# Patient Record
Sex: Male | Born: 1950
Health system: Southern US, Community
[De-identification: ages and names within clinical notes are randomized; demographics above are authoritative.]

## PROBLEM LIST (undated history)

## (undated) DIAGNOSIS — I77810 Thoracic aortic ectasia: Secondary | ICD-10-CM

## (undated) DIAGNOSIS — I219 Acute myocardial infarction, unspecified: Secondary | ICD-10-CM

## (undated) DIAGNOSIS — I10 Essential (primary) hypertension: Secondary | ICD-10-CM

## (undated) DIAGNOSIS — E119 Type 2 diabetes mellitus without complications: Secondary | ICD-10-CM

## (undated) DIAGNOSIS — E7800 Pure hypercholesterolemia, unspecified: Secondary | ICD-10-CM

## (undated) DIAGNOSIS — R251 Tremor, unspecified: Secondary | ICD-10-CM

## (undated) DIAGNOSIS — Z87442 Personal history of urinary calculi: Secondary | ICD-10-CM

## (undated) DIAGNOSIS — I350 Nonrheumatic aortic (valve) stenosis: Secondary | ICD-10-CM

## (undated) DIAGNOSIS — Z9289 Personal history of other medical treatment: Secondary | ICD-10-CM

## (undated) DIAGNOSIS — I2510 Atherosclerotic heart disease of native coronary artery without angina pectoris: Secondary | ICD-10-CM

## (undated) HISTORY — PX: OTHER SURGICAL HISTORY: SHX169

## (undated) HISTORY — DX: Personal history of urinary calculi: Z87.442

## (undated) HISTORY — DX: Thoracic aortic ectasia: I77.810

## (undated) HISTORY — DX: Pure hypercholesterolemia, unspecified: E78.00

## (undated) HISTORY — DX: Nonrheumatic aortic (valve) stenosis: I35.0

## (undated) HISTORY — DX: Tremor, unspecified: R25.1

## (undated) HISTORY — DX: Personal history of other medical treatment: Z92.89

## (undated) HISTORY — DX: Atherosclerotic heart disease of native coronary artery without angina pectoris: I25.10

## (undated) HISTORY — DX: Essential (primary) hypertension: I10

## (undated) HISTORY — DX: Type 2 diabetes mellitus without complications: E11.9

## (undated) HISTORY — DX: Acute myocardial infarction, unspecified: I21.9

---

## 1998-07-13 ENCOUNTER — Emergency Department (HOSPITAL_COMMUNITY): Admission: EM | Admit: 1998-07-13 | Discharge: 1998-07-13 | Payer: Self-pay | Admitting: Emergency Medicine

## 1998-07-13 ENCOUNTER — Encounter: Payer: Self-pay | Admitting: Emergency Medicine

## 2000-11-16 ENCOUNTER — Ambulatory Visit (HOSPITAL_COMMUNITY): Admission: RE | Admit: 2000-11-16 | Discharge: 2000-11-16 | Payer: Self-pay | Admitting: Gastroenterology

## 2000-11-16 ENCOUNTER — Encounter (INDEPENDENT_AMBULATORY_CARE_PROVIDER_SITE_OTHER): Payer: Self-pay | Admitting: Specialist

## 2006-08-21 ENCOUNTER — Ambulatory Visit (HOSPITAL_BASED_OUTPATIENT_CLINIC_OR_DEPARTMENT_OTHER): Admission: RE | Admit: 2006-08-21 | Discharge: 2006-08-21 | Payer: Self-pay | Admitting: Urology

## 2006-08-27 ENCOUNTER — Ambulatory Visit (HOSPITAL_COMMUNITY): Admission: RE | Admit: 2006-08-27 | Discharge: 2006-08-27 | Payer: Self-pay | Admitting: Urology

## 2010-03-23 ENCOUNTER — Emergency Department (HOSPITAL_COMMUNITY): Admission: EM | Admit: 2010-03-23 | Discharge: 2010-03-23 | Payer: Self-pay | Admitting: Emergency Medicine

## 2010-06-10 ENCOUNTER — Observation Stay (HOSPITAL_COMMUNITY): Admission: RE | Admit: 2010-06-10 | Discharge: 2010-06-11 | Payer: Self-pay | Admitting: Cardiology

## 2011-02-04 LAB — CBC
HCT: 46.3 % (ref 39.0–52.0)
Hemoglobin: 16.2 g/dL (ref 13.0–17.0)
MCH: 32.7 pg (ref 26.0–34.0)
MCHC: 34.9 g/dL (ref 30.0–36.0)
MCV: 93.8 fL (ref 78.0–100.0)
Platelets: 146 10*3/uL — ABNORMAL LOW (ref 150–400)
RBC: 4.94 MIL/uL (ref 4.22–5.81)
RDW: 13.7 % (ref 11.5–15.5)
WBC: 9.7 10*3/uL (ref 4.0–10.5)

## 2011-02-04 LAB — BASIC METABOLIC PANEL
BUN: 13 mg/dL (ref 6–23)
CO2: 26 mEq/L (ref 19–32)
Calcium: 9.5 mg/dL (ref 8.4–10.5)
Chloride: 104 mEq/L (ref 96–112)
Creatinine, Ser: 1.09 mg/dL (ref 0.4–1.5)
GFR calc Af Amer: >60 mL/min (ref >60)
GFR calc non Af Amer: >60 mL/min (ref >60)
Glucose, Bld: 103 mg/dL — ABNORMAL HIGH (ref 70–99)
Potassium: 4 mEq/L (ref 3.5–5.1)
Sodium: 141 mEq/L (ref 135–145)

## 2011-02-07 LAB — COMPREHENSIVE METABOLIC PANEL
ALT: 35 U/L (ref 0–53)
Albumin: 4.3 g/dL (ref 3.5–5.2)
Calcium: 9.6 mg/dL (ref 8.4–10.5)
GFR calc Af Amer: >60 mL/min (ref >60)
Glucose, Bld: 115 mg/dL — ABNORMAL HIGH (ref 70–99)
Sodium: 139 mEq/L (ref 135–145)
Total Protein: 7.3 g/dL (ref 6.0–8.3)

## 2011-02-07 LAB — URINE CULTURE: Colony Count: 30000

## 2011-02-07 LAB — CBC
Hemoglobin: 17 g/dL (ref 13.0–17.0)
MCHC: 35.4 g/dL (ref 30.0–36.0)
Platelets: 158 10*3/uL (ref 150–400)
RDW: 13.5 % (ref 11.5–15.5)

## 2011-02-07 LAB — POCT CARDIAC MARKERS
CKMB, poc: 2.2 ng/mL (ref 1.0–8.0)
Myoglobin, poc: 58.1 ng/mL (ref 12–200)
Myoglobin, poc: 69.6 ng/mL (ref 12–200)

## 2011-02-07 LAB — URINALYSIS, ROUTINE W REFLEX MICROSCOPIC
Glucose, UA: NEGATIVE mg/dL
Specific Gravity, Urine: 1.02 (ref 1.005–1.030)
pH: 7 (ref 5.0–8.0)

## 2011-02-07 LAB — DIFFERENTIAL
Eosinophils Absolute: 0.2 10*3/uL (ref 0.0–0.7)
Lymphs Abs: 1.5 10*3/uL (ref 0.7–4.0)
Monocytes Relative: 11 % (ref 3–12)
Neutrophils Relative %: 63 % (ref 43–77)

## 2011-04-07 NOTE — Op Note (Signed)
NAMEKONNER, SAIZ                 ACCOUNT NO.:  [id number]   MEDICAL RECORD NO.:  [id number]          PATIENT TYPE:  AMB   LOCATION:  [facility]                         FACILITY:  [facility]   PHYSICIAN:  Boston Service, M.D.DATE OF BIRTH:  February 26, 1951   DATE OF PROCEDURE:  08/21/2006  DATE OF DISCHARGE:                                 OPERATIVE REPORT   PREOPERATIVE DIAGNOSIS:  Nausea, vomiting, intractable pain, 7 mm right UPJ  stone.   POSTOPERATIVE DIAGNOSIS:  Nausea, vomiting, intractable pain, 7 mm right UPJ  stone.   PROCEDURES:  Cystoscopy, retrograde, attempted ureteroscopy and right double-  J stent placement.   SURGEON:  Boston Service, M.D.   ASSISTANT:  None.   ANESTHESIA:  General.   SPECIMENS:  None.   DRAIN:  A 6-French 26 cm double-J stent.   COMPLICATIONS:  None obvious.   DESCRIPTION OF PROCEDURE:  The patient was prepped and draped in the dorsal  lithotomy position after institution of an adequate level of general  anesthesia.  CT scan, August 20, 2006, 7 mm stone proximal right ureter.  A 21-French panendoscope was gently inserted at the urethral meatus, normal  urethra and sphincter, nonobstructive prostate.  Clear efflux left orifice,  minimal efflux right orifice.   Retrograde catheter was selected, positioned at the left ureteral orifice.  No evidence of filling defect or obstruction with gentle injection of 3-6 mL  of contrast.  There was prompt drainage at 3-5 minutes.  Similar technique  was used on the right side with gentle injection of contrast, 7 mm filling  defect was outlined immediately below the UPJ.  With gentle injection of  contrast, dilated pelvis and calyces were demonstrated.  Stone began to  migrate proximally almost immediately.  No additional contrast was injected.  Guidewire was inserted through the end-hole catheter, negotiated in the  upper pole calyces.  Cystoscope was removed and replaced with the  ureteroscope.   Ureteroscope was then passed without resistance through the  distal two-thirds of the ureter with continued irrigation flow through the  ureteroscope, however, stone migrated back into what appeared to be the  lower pole calyces.  Decision made not to pursue the stone into the renal  pelvis.  Ureteroscope was then withdrawn.  Indwelling guidewire was used for  positioning of double-J stent.  With removal of the guidewire, there was  excellent pigtail formation on either end of the double-J stent was prompt  reflux of pink tinged urine through the fenestrations of the double-J  bladder was  drained.  Cystoscope was removed.  The patient was given B&O suppository and  returned to recovery in satisfactory condition.   PLAN:  Follow-up ESWL probably later this week.           ______________________________  Boston Service, M.D.     RH/MEDQ  D:  08/21/2006  T:  08/22/2006  Job:  [id number]   cc:   C. Duane Lope, M.D.  Fax: [phone]

## 2011-04-07 NOTE — Procedures (Signed)
[facility]  Patient:    [name] [name], [name] [name]                        MRN: [id number] Proc. Date: 11/16/00 Adm. Date:  [id number] Attending:  Louie Bun CC:         Jamesetta Geralds, M.D.   Procedure Report  PROCEDURE:  Colonoscopy with polypectomy.  ENDOSCOPIST:  Everardo All. Madilyn Fireman.  INDICATION FOR PROCEDURE:  Recently discovered colon polyps on a sigmoidoscopy.  DESCRIPTION OF PROCEDURE:  The patient was placed in the left lateral decubitus position and placed on the pulse monitor with continuous low-flow oxygen delivered by nasal cannula.  He was sedated with Demerol 70 mg IV and Versed 6.5 mg IV.  The Olympus video colonoscope was inserted into the rectum and advanced to the cecum, confirmed by transillumination of McBurneys point and visualization of the ileocecal valve and appendiceal orifice.  The prep was good.  The cecum, ascending, transverse, descending and proximal sigmoid colon appeared normal, with no masses, polyps, diverticula or other mucosal abnormalities.  Within the distal sigmoid and the rectosigmoid, respectively, were seen two round polyps approximately 1 cm each, which were both removed by snare.  The remainder of the rectum appeared normal.  The colonoscope was then withdrawn and the patient returned to the recovery room in stable condition. He tolerated the procedure well and there were no immediate complications.  IMPRESSION:  Two rectosigmoid colon polyps.  PLAN:  Await histology and are to determine interval and method for future colon screening. DD:  11/16/00 TD:  11/16/00 Job: [id number]

## 2011-06-21 DIAGNOSIS — Z9289 Personal history of other medical treatment: Secondary | ICD-10-CM

## 2011-06-21 HISTORY — DX: Personal history of other medical treatment: Z92.89

## 2013-11-03 ENCOUNTER — Telehealth: Payer: Self-pay | Admitting: Cardiology

## 2013-11-03 DIAGNOSIS — E785 Hyperlipidemia, unspecified: Secondary | ICD-10-CM

## 2013-11-03 NOTE — Telephone Encounter (Signed)
New message    Is pt due for blood work?----pt has not had crestor or zetia for over a month because they cannot afford it.  Do we have any presciption vouchers?

## 2013-11-03 NOTE — Telephone Encounter (Signed)
To [name] [name] to advise. Pt was due to lab work last may 2014. He has not had either his Crestor or Zetia for the past month due to not being able to afford. Suggestions for pt? Also should we wait for blood work since he has been off the medications so long?

## 2013-11-04 NOTE — Telephone Encounter (Signed)
Don't get blood work now as not on medications.  Will have him change Crestor to generic lipitor 80 mg once daily for cost reasons.  Also okay to give Zetia voucher for patient for reduced cost.  I have vouchers for patient if you need one.  Recheck lipid panel and hepatic panel 8 weeks later.   Plan: 1.  D/C Crestor. 2.  Start atoravastatin 80 mg qd. 3.  Continue Zetia 10 mg qd.  Give voucher for free month, and one for reduced cost - see me if you can't find one. 4.  Recheck lipid panel and hepatic panel 8 weeks later. Please notify patient, update meds, and set up labs. Thanks.

## 2013-11-05 MED ORDER — EZETIMIBE 10 MG PO TABS: 10.0000 mg | ORAL_TABLET | Freq: Every day | ORAL | Status: DC

## 2013-11-05 MED ORDER — ATORVASTATIN CALCIUM 80 MG PO TABS: 80.0000 mg | ORAL_TABLET | Freq: Every day | ORAL | Status: DC

## 2013-11-05 NOTE — Telephone Encounter (Signed)
Pt is aware and set up for labs. New rx sent in and zetia discount card put up front for pt. Pt is aware to come pick up from office

## 2013-12-30 ENCOUNTER — Encounter: Payer: Self-pay | Admitting: General Surgery

## 2013-12-30 DIAGNOSIS — Z79899 Other long term (current) drug therapy: Secondary | ICD-10-CM | POA: Insufficient documentation

## 2013-12-30 DIAGNOSIS — E1169 Type 2 diabetes mellitus with other specified complication: Secondary | ICD-10-CM | POA: Insufficient documentation

## 2013-12-30 DIAGNOSIS — I2510 Atherosclerotic heart disease of native coronary artery without angina pectoris: Secondary | ICD-10-CM

## 2013-12-30 DIAGNOSIS — E7800 Pure hypercholesterolemia, unspecified: Secondary | ICD-10-CM

## 2013-12-30 DIAGNOSIS — I10 Essential (primary) hypertension: Secondary | ICD-10-CM | POA: Insufficient documentation

## 2013-12-30 DIAGNOSIS — E785 Hyperlipidemia, unspecified: Secondary | ICD-10-CM | POA: Insufficient documentation

## 2013-12-30 DIAGNOSIS — I25110 Atherosclerotic heart disease of native coronary artery with unstable angina pectoris: Secondary | ICD-10-CM | POA: Insufficient documentation

## 2014-01-07 ENCOUNTER — Telehealth: Payer: Self-pay | Admitting: Cardiology

## 2014-01-07 ENCOUNTER — Ambulatory Visit: Payer: Self-pay | Admitting: Cardiology

## 2014-01-07 NOTE — Telephone Encounter (Signed)
New problem   Pt need to know if he can have a generic brand for Crestor 40mg  and Zetia 10mg . Please call pt concerning this matter.

## 2014-01-07 NOTE — Telephone Encounter (Signed)
Pt is aware. To Rose to give pt samples if available. Please call. Ok to talk to wife.

## 2014-01-08 NOTE — Telephone Encounter (Signed)
Placed samples of crestor and zetia at front desk

## 2014-02-02 ENCOUNTER — Other Ambulatory Visit (INDEPENDENT_AMBULATORY_CARE_PROVIDER_SITE_OTHER): Payer: BC Managed Care – PPO

## 2014-02-02 DIAGNOSIS — E785 Hyperlipidemia, unspecified: Secondary | ICD-10-CM

## 2014-02-02 LAB — HEPATIC FUNCTION PANEL
ALBUMIN: 4.4 g/dL (ref 3.5–5.2)
ALT: 23 U/L (ref 0–53)
AST: 20 U/L (ref 0–37)
Alkaline Phosphatase: 56 U/L (ref 39–117)
BILIRUBIN TOTAL: 1 mg/dL (ref 0.3–1.2)
Bilirubin, Direct: 0.1 mg/dL (ref 0.0–0.3)
TOTAL PROTEIN: 7.5 g/dL (ref 6.0–8.3)

## 2014-02-02 LAB — LIPID PANEL
Cholesterol: 138 mg/dL (ref 0–200)
HDL: 46.7 mg/dL
LDL Cholesterol: 58 mg/dL (ref 0–99)
Total CHOL/HDL Ratio: 3
Triglycerides: 168 mg/dL — ABNORMAL HIGH (ref 0.0–149.0)
VLDL: 33.6 mg/dL (ref 0.0–40.0)

## 2014-02-04 ENCOUNTER — Other Ambulatory Visit: Payer: Self-pay | Admitting: Cardiology

## 2014-02-04 ENCOUNTER — Ambulatory Visit (INDEPENDENT_AMBULATORY_CARE_PROVIDER_SITE_OTHER): Payer: BC Managed Care – PPO | Admitting: Cardiology

## 2014-02-04 ENCOUNTER — Encounter (INDEPENDENT_AMBULATORY_CARE_PROVIDER_SITE_OTHER): Payer: Self-pay

## 2014-02-04 ENCOUNTER — Encounter: Payer: Self-pay | Admitting: Cardiology

## 2014-02-04 VITALS — BP 134/82 | HR 62 | Ht 70.0 in | Wt 234.0 lb

## 2014-02-04 DIAGNOSIS — I10 Essential (primary) hypertension: Secondary | ICD-10-CM

## 2014-02-04 DIAGNOSIS — E7800 Pure hypercholesterolemia, unspecified: Secondary | ICD-10-CM

## 2014-02-04 DIAGNOSIS — I350 Nonrheumatic aortic (valve) stenosis: Secondary | ICD-10-CM | POA: Insufficient documentation

## 2014-02-04 DIAGNOSIS — I359 Nonrheumatic aortic valve disorder, unspecified: Secondary | ICD-10-CM

## 2014-02-04 DIAGNOSIS — I2510 Atherosclerotic heart disease of native coronary artery without angina pectoris: Secondary | ICD-10-CM

## 2014-02-04 NOTE — Progress Notes (Signed)
[place], [address], [place]  [address] Phone: [phone] Fax:  [phone]  Date:  02/04/2014   ID:  [name] [name], [name] [name] Jul 30, 1951, MRN [id number]  PCP:   Melinda Crutch, MD  Cardiologist:  Fransico Him, MD     History of Present Illness: [name] [name] is a 63 y.o. male with a history of ASCAD s/-p PCI LAD and residual RCA of 50-60%, mild AS by echo 2012, HTN and dyslipidemia who present today for followup.  He is doing well.  He denies any chest pain, LE edema, dizziness, palpitations or syncope.  He has no SOB at baseline but may have some mild DOE with extreme exertion.  He does not get any formal exercise but plans to start soon. He has taken carbs out of his diet.   Wt Readings from Last 3 Encounters:  02/04/14 234 lb (106.142 kg)     Past Medical History  Diagnosis Date  . Hypertension   . High cholesterol   . History of kidney stones   . History of echocardiogram 06/2011    Normal LVF,mild LVH,trivial TR/MR, mild AS/AI  . Coronary artery disease     s/p PCI of LAD w Residual 50-60% RCA  . Mild aortic stenosis     echo 06/2011    Current Outpatient Prescriptions  Medication Sig Dispense Refill  . allopurinol (ZYLOPRIM) 300 MG tablet Take 300 mg by mouth daily.      Marland Kitchen amLODipine (NORVASC) 10 MG tablet Take 10 mg by mouth daily.      Marland Kitchen aspirin 325 MG tablet Take 325 mg by mouth daily.      Marland Kitchen atorvastatin (LIPITOR) 80 MG tablet Take 1 tablet (80 mg total) by mouth daily.  30 tablet  11  . buPROPion (WELLBUTRIN XL) 300 MG 24 hr tablet Take 300 mg by mouth daily.      . Colesevelam HCl (WELCHOL) 3.75 G PACK Take 3.75 packets by mouth daily.      Marland Kitchen ezetimibe (ZETIA) 10 MG tablet Take 1 tablet (10 mg total) by mouth daily.      . indomethacin (INDOCIN) 50 MG capsule Take 50 mg by mouth as needed (w food).      . nitroGLYCERIN (NITROSTAT) 0.4 MG SL tablet Place 0.4 mg under the tongue every 5 (five) minutes as needed for chest pain.      .  promethazine-dextromethorphan (PROMETHAZINE-DM) 6.25-15 MG/5ML syrup Take by mouth 4 (four) times daily as needed for cough.      . propranolol (INDERAL) 40 MG tablet Take 40 mg by mouth 2 (two) times daily.      . rosuvastatin (CRESTOR) 40 MG tablet Take 40 mg by mouth daily.      Marland Kitchen telmisartan-hydrochlorothiazide (MICARDIS HCT) 80-25 MG per tablet Take 1 tablet by mouth daily. Takes 100/12.5 now       No current facility-administered medications for this visit.    Allergies:    Allergies  Allergen Reactions  . Codeine Other (See Comments)    Vertigo and upset stomach    Social History:  The patient  reports that he has never smoked. He does not have any smokeless tobacco history on file. He reports that he drinks alcohol. He reports that he does not use illicit drugs.   Family History:  The patient's family history includes CAD in his brother and mother; Emphysema in his mother; Heart failure in his mother.   ROS:  Please see the history of  present illness.      All other systems reviewed and negative.   PHYSICAL EXAM: VS:  BP 134/82  Pulse 62  Ht 5\' 10"  (1.778 m)  Wt 234 lb (106.142 kg)  BMI 33.58 kg/m2 Well nourished, well developed, in no acute distress HEENT: normal Neck: no JVD Cardiac:  normal S1, S2; RRR; no murmur Lungs:  clear to auscultation bilaterally, no wheezing, rhonchi or rales Abd: soft, nontender, no hepatomegaly Ext: no edema Skin: warm and dry Neuro:  CNs 2-12 intact, no focal abnormalities noted  EKG:  NSR     ASSESSMENT AND PLAN:  1. ASCAD with no angina - continue ASA 2. HTN - well controlled - continue amlodipine/Micardis HCT/propranolol 3. Dyslipidemia - LDL at goal - continue crestor/Zetia/welchol 4. Mild AS  Followup with me in 6 months  Signed, Fransico Him, MD 02/04/2014 8:14 AM

## 2014-02-04 NOTE — Patient Instructions (Signed)
Your physician recommends that you continue on your current medications as directed. Please refer to the Current Medication list given to you today.  Your physician wants you to follow-up in: 6 Months with Dr Turner You will receive a reminder letter in the mail two months in advance. If you don't receive a letter, please call our office to schedule the follow-up appointment.  

## 2014-02-06 ENCOUNTER — Encounter: Payer: Self-pay | Admitting: General Surgery

## 2014-02-06 ENCOUNTER — Other Ambulatory Visit: Payer: Self-pay | Admitting: General Surgery

## 2014-02-06 DIAGNOSIS — E7800 Pure hypercholesterolemia, unspecified: Secondary | ICD-10-CM

## 2014-02-16 ENCOUNTER — Other Ambulatory Visit: Payer: Self-pay | Admitting: Cardiology

## 2014-02-17 NOTE — Telephone Encounter (Signed)
Is the patient supposed to be on this or micardis? Please advise. Thanks, MI

## 2014-02-18 NOTE — Telephone Encounter (Signed)
lmtrc

## 2014-06-11 ENCOUNTER — Encounter: Payer: Self-pay | Admitting: Cardiology

## 2014-08-10 ENCOUNTER — Other Ambulatory Visit (INDEPENDENT_AMBULATORY_CARE_PROVIDER_SITE_OTHER): Payer: BC Managed Care – PPO

## 2014-08-10 DIAGNOSIS — E7800 Pure hypercholesterolemia, unspecified: Secondary | ICD-10-CM

## 2014-08-10 LAB — LIPID PANEL
CHOL/HDL RATIO: 4
CHOLESTEROL: 144 mg/dL (ref 0–200)
HDL: 36.2 mg/dL — ABNORMAL LOW (ref >39.00)
NonHDL: 107.8
TRIGLYCERIDES: 291 mg/dL — AB (ref 0.0–149.0)
VLDL: 58.2 mg/dL — ABNORMAL HIGH (ref 0.0–40.0)

## 2014-08-10 LAB — ALT: ALT: 28 U/L (ref 0–53)

## 2014-08-10 LAB — LDL CHOLESTEROL, DIRECT: LDL DIRECT: 85.2 mg/dL

## 2014-08-12 ENCOUNTER — Other Ambulatory Visit: Payer: Self-pay

## 2014-08-12 DIAGNOSIS — E785 Hyperlipidemia, unspecified: Secondary | ICD-10-CM

## 2014-08-24 ENCOUNTER — Ambulatory Visit: Payer: BC Managed Care – PPO | Admitting: Cardiology

## 2014-09-26 ENCOUNTER — Other Ambulatory Visit: Payer: Self-pay | Admitting: Cardiology

## 2014-10-02 ENCOUNTER — Ambulatory Visit: Payer: BC Managed Care – PPO | Admitting: Cardiology

## 2014-10-27 ENCOUNTER — Ambulatory Visit: Payer: BC Managed Care – PPO | Admitting: Cardiology

## 2014-11-25 ENCOUNTER — Ambulatory Visit: Payer: BC Managed Care – PPO | Admitting: Cardiology

## 2014-12-11 ENCOUNTER — Other Ambulatory Visit: Payer: BC Managed Care – PPO

## 2014-12-22 ENCOUNTER — Encounter: Payer: Self-pay | Admitting: Cardiology

## 2015-02-01 ENCOUNTER — Other Ambulatory Visit (INDEPENDENT_AMBULATORY_CARE_PROVIDER_SITE_OTHER): Payer: BLUE CROSS/BLUE SHIELD | Admitting: *Deleted

## 2015-02-01 DIAGNOSIS — E785 Hyperlipidemia, unspecified: Secondary | ICD-10-CM

## 2015-02-01 LAB — LIPID PANEL
CHOL/HDL RATIO: 4
Cholesterol: 190 mg/dL (ref 0–200)
HDL: 44.9 mg/dL (ref >39.00)
LDL CALC: 108 mg/dL — AB (ref 0–99)
NONHDL: 145.1
Triglycerides: 184 mg/dL — ABNORMAL HIGH (ref 0.0–149.0)
VLDL: 36.8 mg/dL (ref 0.0–40.0)

## 2015-02-02 NOTE — Progress Notes (Signed)
Cardiology Office Note   Date:  02/03/2015   ID:  Rivers, Hamrick [birth year], MRN [id number]  PCP:   Melinda Crutch, MD  Cardiologist:   Sueanne Margarita, MD   Chief Complaint  Patient presents with  . Coronary Artery Disease  . Hypertension  . Hyperlipidemia  . Aortic Stenosis      History of Present Illness: [name] [name] is a 64 y.o. male with a history of ASCAD s/-p PCI LAD and residual RCA of 50-60%, mild AS by echo 2012, HTN and dyslipidemia who present today for followup. He is doing well. He denies any chest pain, LE edema, dizziness, palpitations or syncope. He has no SOB at baseline but may have some mild DOE with extreme exertion. He does not get any formal exercise except walking at work. He has not been following a strict diet and has gained 9 pounds.  He has a bike and plans to start riding.     Past Medical History  Diagnosis Date  . Hypertension   . High cholesterol   . History of kidney stones   . History of echocardiogram 06/2011    Normal LVF,mild LVH,trivial TR/MR, mild AS/AI  . Coronary artery disease     s/p PCI of LAD w Residual 50-60% RCA  . Mild aortic stenosis     echo 10/13    No past surgical history on file.   Current Outpatient Prescriptions  Medication Sig Dispense Refill  . allopurinol (ZYLOPRIM) 300 MG tablet Take 300 mg by mouth daily.    Marland Kitchen amLODipine (NORVASC) 10 MG tablet TAKE 1 TABLET BY MOUTH EVERY DAY 30 tablet 0  . aspirin 325 MG tablet Take 325 mg by mouth daily.    Marland Kitchen buPROPion (WELLBUTRIN XL) 300 MG 24 hr tablet Take 300 mg by mouth daily.    . CRESTOR 40 MG tablet TAKE 1 TABLET BY MOUTH EVERY DAY 30 tablet 0  . indomethacin (INDOCIN) 50 MG capsule Take 50 mg by mouth as needed (w food).    . losartan-hydrochlorothiazide (HYZAAR) 100-12.5 MG per tablet Take 1 tablet by mouth daily.    . nitroGLYCERIN (NITROSTAT) 0.4 MG SL tablet Place 0.4 mg under the tongue every 5 (five) minutes as needed for chest pain.    .  promethazine-dextromethorphan (PROMETHAZINE-DM) 6.25-15 MG/5ML syrup Take by mouth 4 (four) times daily as needed for cough.    . propranolol (INDERAL) 80 MG tablet Take 80 mg by mouth daily.    Marland Kitchen ZETIA 10 MG tablet TAKE 1 TABLET BY MOUTH EVERY DAY 30 tablet 5   No current facility-administered medications for this visit.    Allergies:   Codeine    Social History:  The patient  reports that he has never smoked. He does not have any smokeless tobacco history on file. He reports that he drinks alcohol. He reports that he does not use illicit drugs.   Family History:  The patient's family history includes CAD in his brother and mother; Emphysema in his mother; Heart failure in his mother.    ROS:  Please see the history of present illness.   Otherwise, review of systems are positive for none.   All other systems are reviewed and negative.    PHYSICAL EXAM: VS:  BP 160/88 mmHg  Pulse 59  Ht 5\' 10"  (1.778 m)  Wt 243 lb (110.224 kg)  BMI 34.87 kg/m2 , BMI Body mass index is 34.87 kg/(m^2). GEN: Well nourished, well developed, in no  acute distress HEENT: normal Neck: no JVD, carotid bruits, or masses Cardiac: RRR; no murmurs, rubs, or gallops,no edema  Respiratory:  clear to auscultation bilaterally, normal work of breathing GI: soft, nontender, nondistended, + BS MS: no deformity or atrophy Skin: warm and dry, no rash Neuro:  Strength and sensation are intact Psych: euthymic mood, full affect   EKG:  EKG was ordered today and showed sinus bradycardia at 59bpm with LVH by voltage    Recent Labs: 08/10/2014: ALT 28    Lipid Panel    Component Value Date/Time   CHOL 190 02/01/2015 0827   TRIG 184.0* 02/01/2015 0827   HDL 44.90 02/01/2015 0827   CHOLHDL 4 02/01/2015 0827   VLDL 36.8 02/01/2015 0827   LDLCALC 108* 02/01/2015 0827   LDLDIRECT 85.2 08/10/2014 1103      Wt Readings from Last 3 Encounters:  02/03/15 243 lb (110.224 kg)  02/04/14 234 lb (106.142 kg)        ASSESSMENT AND PLAN:  1. ASCAD with no angina - continue ASA 2. HTN - borderline controlled - continue amlodipine/Micardis HCT/propranolol - check BMET - I have asked him to check his BP daily for a week and call with the results 3. Dyslipidemia - LDL not at goal - he will be scheduled for lipid clinic to start PCSK - 9 - we discussed trying to be more compliant with strict diet and exercise program 4. Mild AS- recheck 2D echo for progression - last echo was 3 years ago  Current medicines are reviewed at length with the patient today.  The patient does not have concerns regarding medicines.  The following changes have been made:  no change  Labs/ tests ordered today include: BMET, 2D echo   Orders Placed This Encounter  Procedures  . Basic Metabolic Panel (BMET)  . Ambulatory referral to Lipid Clinic  . EKG 12-Lead  . 2D Echocardiogram without contrast     Disposition:   FU with me in 6 months   Signed, Sueanne Margarita, MD  02/03/2015 10:14 AM    [facility] [place], [place], [place]  [address] Phone: [phone]; Fax: [phone]

## 2015-02-03 ENCOUNTER — Encounter: Payer: Self-pay | Admitting: Cardiology

## 2015-02-03 ENCOUNTER — Ambulatory Visit (INDEPENDENT_AMBULATORY_CARE_PROVIDER_SITE_OTHER): Payer: BLUE CROSS/BLUE SHIELD | Admitting: Cardiology

## 2015-02-03 VITALS — BP 160/88 | HR 59 | Ht 70.0 in | Wt 243.0 lb

## 2015-02-03 DIAGNOSIS — I10 Essential (primary) hypertension: Secondary | ICD-10-CM

## 2015-02-03 DIAGNOSIS — I350 Nonrheumatic aortic (valve) stenosis: Secondary | ICD-10-CM

## 2015-02-03 DIAGNOSIS — E7800 Pure hypercholesterolemia, unspecified: Secondary | ICD-10-CM

## 2015-02-03 DIAGNOSIS — I2510 Atherosclerotic heart disease of native coronary artery without angina pectoris: Secondary | ICD-10-CM

## 2015-02-03 LAB — BASIC METABOLIC PANEL
BUN: 15 mg/dL (ref 6–23)
CO2: 28 meq/L (ref 19–32)
Calcium: 10.2 mg/dL (ref 8.4–10.5)
Chloride: 103 mEq/L (ref 96–112)
Creatinine, Ser: 1.17 mg/dL (ref 0.40–1.50)
GFR: 66.78 mL/min (ref >60.00)
Glucose, Bld: 130 mg/dL — ABNORMAL HIGH (ref 70–99)
POTASSIUM: 4.1 meq/L (ref 3.5–5.1)
SODIUM: 138 meq/L (ref 135–145)

## 2015-02-03 NOTE — Patient Instructions (Addendum)
Your physician recommends that you continue on your current medications as directed. Please refer to the Current Medication list given to you today.  Your physician has requested that you have an echocardiogram. Echocardiography is a painless test that uses sound waves to create images of your heart. It provides your doctor with information about the size and shape of your heart and how well your heart's chambers and valves are working. This procedure takes approximately one hour. There are no restrictions for this procedure.  Your physician recommends that you have lab work TODAY (BMET)  You have been referred to [facility].   Your physician wants you to follow-up in: 6 months with Dr. Radford Pax. You will receive a reminder letter in the mail two months in advance. If you don't receive a letter, please call our office to schedule the follow-up appointment.

## 2015-02-10 ENCOUNTER — Ambulatory Visit (INDEPENDENT_AMBULATORY_CARE_PROVIDER_SITE_OTHER): Payer: BLUE CROSS/BLUE SHIELD | Admitting: Pharmacist

## 2015-02-10 ENCOUNTER — Ambulatory Visit (HOSPITAL_COMMUNITY): Payer: BLUE CROSS/BLUE SHIELD | Attending: Cardiology | Admitting: Cardiology

## 2015-02-10 DIAGNOSIS — I350 Nonrheumatic aortic (valve) stenosis: Secondary | ICD-10-CM | POA: Diagnosis not present

## 2015-02-10 DIAGNOSIS — E7800 Pure hypercholesterolemia, unspecified: Secondary | ICD-10-CM

## 2015-02-10 NOTE — Assessment & Plan Note (Addendum)
Patient with ASCVD and currently uncontrolled hyperlipidemia with LDL above goal <70 despite max dose Crestor 40mg  and Zetia 10mg . Patient qualifies for PCSK9 inhibitor therapy. Discussed Praluent in detail during today's visit and showed injection technique with demo pen. Will start paperwork for Praluent enrollment and will call patient in ~1 month. Patient plans on coming to clinic for his first injection.

## 2015-02-10 NOTE — Progress Notes (Signed)
Echo performed. 

## 2015-02-10 NOTE — Progress Notes (Signed)
[name] [name] is a 64 yo male with a history of ASCVD s/p PCI LAD and residual RCA of 50-60%, mild aortic stenosis by echo in 2012, HTN, and dyslipidemia. He was referred to pharmacy clinic by Dr. Radford Pax for lipid management.  Patient currently takes Crestor 40mg  daily and Zetia 10mg  daily. Does report some myalgias with his Crestor however he states that it's tolerable. He used to take Welchol as well, but has since stopped since he did not tolerate it well and his TG are elevated > 150.  FH: brother and mother with CAD  SH: no history of smoking or illicit drug use, reports that he drinks alcohol  Recent Lipid Panel: 02/01/15 (on Crestor 40mg , Zetia 10mg ) TC    190 TG    184 HDL  45 LDL  108  Current Outpatient Prescriptions on File Prior to Visit  Medication Sig Dispense Refill  . allopurinol (ZYLOPRIM) 300 MG tablet Take 300 mg by mouth daily.    Marland Kitchen amLODipine (NORVASC) 10 MG tablet TAKE 1 TABLET BY MOUTH EVERY DAY 30 tablet 0  . aspirin 325 MG tablet Take 325 mg by mouth daily.    Marland Kitchen buPROPion (WELLBUTRIN XL) 300 MG 24 hr tablet Take 300 mg by mouth daily.    . CRESTOR 40 MG tablet TAKE 1 TABLET BY MOUTH EVERY DAY 30 tablet 0  . indomethacin (INDOCIN) 50 MG capsule Take 50 mg by mouth as needed (w food).    . losartan-hydrochlorothiazide (HYZAAR) 100-12.5 MG per tablet Take 1 tablet by mouth daily.    . nitroGLYCERIN (NITROSTAT) 0.4 MG SL tablet Place 0.4 mg under the tongue every 5 (five) minutes as needed for chest pain.    . promethazine-dextromethorphan (PROMETHAZINE-DM) 6.25-15 MG/5ML syrup Take by mouth 4 (four) times daily as needed for cough.    . propranolol (INDERAL) 80 MG tablet Take 80 mg by mouth daily.    Marland Kitchen ZETIA 10 MG tablet TAKE 1 TABLET BY MOUTH EVERY DAY 30 tablet 5   No current facility-administered medications on file prior to visit.

## 2015-02-14 ENCOUNTER — Encounter: Payer: Self-pay | Admitting: Pharmacist

## 2015-02-15 ENCOUNTER — Telehealth: Payer: Self-pay

## 2015-02-15 DIAGNOSIS — I350 Nonrheumatic aortic (valve) stenosis: Secondary | ICD-10-CM

## 2015-02-15 DIAGNOSIS — I77810 Thoracic aortic ectasia: Secondary | ICD-10-CM

## 2015-02-15 NOTE — Telephone Encounter (Signed)
-----   Message from Shellia Cleverly, RN sent at 02/15/2015  8:20 AM EDT -----   ----- Message -----    From: Sueanne Margarita, MD    Sent: 02/11/2015   8:43 AM      To: [facility] Triage  Please let patient know that echo showed normal LVF with mildly thickened heart muscle, mild AS, mildly dilated aortic root -repeat echo in 1 year for dilated aortic root

## 2015-02-15 NOTE — Telephone Encounter (Signed)
Informed patient of results and verbal understanding expressed.  Repeat ECHO ordered to be scheduled in 1 year. Patient agrees with treatment plan. 

## 2015-03-10 ENCOUNTER — Telehealth: Payer: Self-pay | Admitting: Pharmacist

## 2015-03-10 ENCOUNTER — Other Ambulatory Visit: Payer: Self-pay

## 2015-03-10 MED ORDER — LOSARTAN POTASSIUM-HCTZ 100-12.5 MG PO TABS: 1.0000 | ORAL_TABLET | Freq: Every day | ORAL | Status: DC

## 2015-03-10 MED ORDER — PROPRANOLOL HCL 80 MG PO TABS: 80.0000 mg | ORAL_TABLET | Freq: Every day | ORAL | Status: DC

## 2015-03-10 MED ORDER — ROSUVASTATIN CALCIUM 40 MG PO TABS: 40.0000 mg | ORAL_TABLET | Freq: Every day | ORAL | Status: DC

## 2015-03-10 MED ORDER — ALLOPURINOL 300 MG PO TABS: 300.0000 mg | ORAL_TABLET | Freq: Every day | ORAL | Status: AC

## 2015-03-10 MED ORDER — AMLODIPINE BESYLATE 10 MG PO TABS: 10.0000 mg | ORAL_TABLET | Freq: Every day | ORAL | Status: DC

## 2015-03-10 MED ORDER — EZETIMIBE 10 MG PO TABS
10.0000 mg | ORAL_TABLET | Freq: Every day | ORAL | Status: DC
Start: 2015-03-10 — End: 2015-11-17

## 2015-03-10 NOTE — Telephone Encounter (Signed)
New message      Pt want Gay Filler to know that praluent was denied by [facility].  What is the next step?

## 2015-03-11 NOTE — Telephone Encounter (Signed)
Spoke with pt's wife.  Appeals letter was sent to [facility] last week and we will give them an update once we have a response back.

## 2015-05-13 ENCOUNTER — Encounter: Payer: Self-pay | Admitting: Cardiology

## 2015-05-31 ENCOUNTER — Encounter: Payer: Self-pay | Admitting: Cardiology

## 2015-06-04 ENCOUNTER — Encounter: Payer: Self-pay | Admitting: Cardiology

## 2015-06-09 ENCOUNTER — Telehealth: Payer: Self-pay | Admitting: Cardiology

## 2015-06-09 DIAGNOSIS — I10 Essential (primary) hypertension: Secondary | ICD-10-CM

## 2015-06-09 NOTE — Telephone Encounter (Signed)
BMET ordered and added to labwork. Per Patient's wife's request, DPR paperwork mailed to patient.

## 2015-06-09 NOTE — Telephone Encounter (Signed)
That is fine 

## 2015-06-09 NOTE — Telephone Encounter (Signed)
New Message   Pt wife wants Dr. Radford Pax to put in a order to have his blood sugar checked at his appt   08/18/15 @ 8:00am in labs.  Pt wife wants to have her husband sign the paper in the office    that states she can be the one that Dr. Radford Pax and staff can talk to about patient care.

## 2015-08-18 ENCOUNTER — Other Ambulatory Visit: Payer: Self-pay | Admitting: Pharmacist

## 2015-08-18 ENCOUNTER — Encounter: Payer: Self-pay | Admitting: Cardiology

## 2015-08-18 ENCOUNTER — Ambulatory Visit (INDEPENDENT_AMBULATORY_CARE_PROVIDER_SITE_OTHER): Payer: BLUE CROSS/BLUE SHIELD | Admitting: Cardiology

## 2015-08-18 ENCOUNTER — Other Ambulatory Visit (INDEPENDENT_AMBULATORY_CARE_PROVIDER_SITE_OTHER): Payer: BLUE CROSS/BLUE SHIELD | Admitting: *Deleted

## 2015-08-18 VITALS — BP 122/70 | HR 57 | Ht 70.0 in | Wt 244.8 lb

## 2015-08-18 DIAGNOSIS — E7800 Pure hypercholesterolemia, unspecified: Secondary | ICD-10-CM

## 2015-08-18 DIAGNOSIS — I2510 Atherosclerotic heart disease of native coronary artery without angina pectoris: Secondary | ICD-10-CM

## 2015-08-18 DIAGNOSIS — I350 Nonrheumatic aortic (valve) stenosis: Secondary | ICD-10-CM | POA: Diagnosis not present

## 2015-08-18 DIAGNOSIS — I77810 Thoracic aortic ectasia: Secondary | ICD-10-CM

## 2015-08-18 DIAGNOSIS — I10 Essential (primary) hypertension: Secondary | ICD-10-CM

## 2015-08-18 LAB — HEPATIC FUNCTION PANEL
ALT: 30 U/L (ref 0–53)
AST: 25 U/L (ref 0–37)
Albumin: 4.2 g/dL (ref 3.5–5.2)
Alkaline Phosphatase: 50 U/L (ref 39–117)
BILIRUBIN TOTAL: 0.7 mg/dL (ref 0.2–1.2)
Bilirubin, Direct: 0.2 mg/dL (ref 0.0–0.3)
TOTAL PROTEIN: 6.9 g/dL (ref 6.0–8.3)

## 2015-08-18 LAB — LIPID PANEL
CHOL/HDL RATIO: 3
Cholesterol: 115 mg/dL (ref 0–200)
HDL: 40.5 mg/dL (ref >39.00)
NONHDL: 74.44
TRIGLYCERIDES: 241 mg/dL — AB (ref 0.0–149.0)
VLDL: 48.2 mg/dL — ABNORMAL HIGH (ref 0.0–40.0)

## 2015-08-18 LAB — BASIC METABOLIC PANEL
BUN: 19 mg/dL (ref 6–23)
CO2: 27 mEq/L (ref 19–32)
CREATININE: 1.04 mg/dL (ref 0.40–1.50)
Calcium: 9.3 mg/dL (ref 8.4–10.5)
Chloride: 101 mEq/L (ref 96–112)
GFR: 76.37 mL/min (ref >60.00)
Glucose, Bld: 130 mg/dL — ABNORMAL HIGH (ref 70–99)
POTASSIUM: 4.1 meq/L (ref 3.5–5.1)
Sodium: 136 mEq/L (ref 135–145)

## 2015-08-18 LAB — LDL CHOLESTEROL, DIRECT: LDL DIRECT: 54 mg/dL

## 2015-08-18 NOTE — Progress Notes (Signed)
Cardiology Office Note   Date:  08/18/2015   ID:  [name], [name] 01/01/1951, MRN [id number]  PCP:   Melinda Crutch, MD    Chief Complaint  Patient presents with  . Atherosclerosis      History of Present Illness: ADD [name] [name] is a 64 y.o. male with a history of ASCAD s/-p PCI LAD and residual RCA of 50-60%, mild AS by echo 2012, HTN and dyslipidemia who present today for followup. He is doing well. He denies any chest pain, dizziness, palpitations or syncope. He occasionally has some mild LE edema.  He has not SOB at baseline but may have some mild DOE with extreme exertion. He does not get any formal exercise except walking at work.    Past Medical History  Diagnosis Date  . Hypertension   . High cholesterol   . History of kidney stones   . History of echocardiogram 06/2011    Normal LVF,mild LVH,trivial TR/MR, mild AS/AI  . Coronary artery disease     s/p PCI of LAD w Residual 50-60% RCA  . Mild aortic stenosis     echo 10/13    History reviewed. No pertinent past surgical history.   Current Outpatient Prescriptions  Medication Sig Dispense Refill  . allopurinol (ZYLOPRIM) 300 MG tablet Take 1 tablet (300 mg total) by mouth daily. 30 tablet 6  . amLODipine (NORVASC) 10 MG tablet Take 1 tablet (10 mg total) by mouth daily. 30 tablet 6  . aspirin 81 MG tablet Take 81 mg by mouth daily.    Marland Kitchen buPROPion (WELLBUTRIN XL) 300 MG 24 hr tablet Take 300 mg by mouth daily.    Marland Kitchen ezetimibe (ZETIA) 10 MG tablet Take 1 tablet (10 mg total) by mouth daily. 30 tablet 6  . indomethacin (INDOCIN) 50 MG capsule Take 50 mg by mouth as needed (w food).    . losartan-hydrochlorothiazide (HYZAAR) 100-12.5 MG per tablet Take 1 tablet by mouth daily. 30 tablet 6  . nitroGLYCERIN (NITROSTAT) 0.4 MG SL tablet Place 0.4 mg under the tongue every 5 (five) minutes as needed for chest pain.    Marland Kitchen propranolol (INDERAL) 80 MG tablet Take 1 tablet (80 mg total) by mouth daily. 30  tablet 6  . rosuvastatin (CRESTOR) 40 MG tablet Take 1 tablet (40 mg total) by mouth daily. (Patient not taking: Reported on 08/18/2015) 30 tablet 6   No current facility-administered medications for this visit.    Allergies:   Codeine    Social History:  The patient  reports that he has never smoked. He does not have any smokeless tobacco history on file. He reports that he drinks alcohol. He reports that he does not use illicit drugs.   Family History:  The patient's family history includes CAD in his brother and mother; Emphysema in his mother; Heart failure in his mother.    ROS:  Please see the history of present illness.   Otherwise, review of systems are positive for none.   All other systems are reviewed and negative.    PHYSICAL EXAM: VS:  BP 122/70 mmHg  Pulse 57  Ht 5\' 10"  (1.778 m)  Wt 244 lb 12.8 oz (111.041 kg)  BMI 35.13 kg/m2  SpO2 95% , BMI Body mass index is 35.13 kg/(m^2). GEN: Well nourished, well developed, in no acute distress HEENT: normal Neck: no JVD, carotid bruits, or masses Cardiac: RRR;  no murmurs, rubs, or gallops,no edema  Respiratory:  clear to auscultation bilaterally, normal work of breathing GI: soft, nontender, nondistended, + BS MS: no deformity or atrophy Skin: warm and dry, no rash Neuro:  Strength and sensation are intact Psych: euthymic mood, full affect   EKG:  EKG is not ordered today.    Recent Labs: 02/03/2015: BUN 15; Creatinine, Ser 1.17; Potassium 4.1; Sodium 138    Lipid Panel    Component Value Date/Time   CHOL 190 02/01/2015 0827   TRIG 184.0* 02/01/2015 0827   HDL 44.90 02/01/2015 0827   CHOLHDL 4 02/01/2015 0827   VLDL 36.8 02/01/2015 0827   LDLCALC 108* 02/01/2015 0827   LDLDIRECT 85.2 08/10/2014 1103      Wt Readings from Last 3 Encounters:  08/18/15 244 lb 12.8 oz (111.041 kg)  02/03/15 243 lb (110.224 kg)  02/04/14 234 lb (106.142 kg)     ASSESSMENT AND PLAN:  1. ASCAD with no angina - continue  ASA 2. HTN - controlled - continue amlodipine/Micardis HCT/propranolol - check BMET 3. Dyslipidemia - now on PCSK 9 inhibitor - continue Praluent - check FLP and ALT 4. Mild AS 5. Mildly dilated aortic root - recheck echo 01/2016    Current medicines are reviewed at length with the patient today.  The patient does not have concerns regarding medicines.  The following changes have been made:  no change  Labs/ tests ordered today: See above Assessment and Plan No orders of the defined types were placed in this encounter.     Disposition:   FU with me in 1 year  Signed, Sueanne Margarita, MD  08/18/2015 8:20 AM    [facility] [place], [place], [place]  [address] Phone: [phone]; Fax: [phone]

## 2015-08-18 NOTE — Patient Instructions (Signed)
Medication Instructions:  Your physician recommends that you continue on your current medications as directed. Please refer to the Current Medication list given to you today.  Labwork: None  Testing/Procedures: Your physician has requested that you have an echocardiogram in March, 2017. Echocardiography is a painless test that uses sound waves to create images of your heart. It provides your doctor with information about the size and shape of your heart and how well your heart's chambers and valves are working. This procedure takes approximately one hour. There are no restrictions for this procedure.   Follow-Up: Your physician wants you to follow-up in: 1 year with Dr. Turner. You will receive a reminder letter in the mail two months in advance. If you don't receive a letter, please call our office to schedule the follow-up appointment.   Any Other Special Instructions Will Be Listed Below (If Applicable).   

## 2015-08-23 ENCOUNTER — Telehealth: Payer: Self-pay | Admitting: Cardiology

## 2015-08-23 MED ORDER — ROSUVASTATIN CALCIUM 40 MG PO TABS: 40.0000 mg | ORAL_TABLET | Freq: Every day | ORAL | Status: DC

## 2015-08-23 NOTE — Telephone Encounter (Signed)
-----   Message from Aris [place], [facility] sent at 08/19/2015 11:15 AM EDT ----- Increase in TG likely due to pt stopping Crestor.  Unsure why patient stopped Crestor- he should continue this with Praluent.  Please have patient restart Crestor.  If he was having side effects, will have to discuss other options as insurance will not re-approve Praluent if he is not taking a statin.

## 2015-08-23 NOTE — Telephone Encounter (Signed)
New message      Returning [name] [name]'s call from friday

## 2015-08-23 NOTE — Telephone Encounter (Signed)
Instructed patient to RESTART CRESTOR at old dose.  Patient st he stopped because he was having cramping in his legs, but he will try again.  Instructed the patient to call if the cramping starts again. Patient agrees with treatment plan.

## 2015-11-01 ENCOUNTER — Other Ambulatory Visit: Payer: Self-pay | Admitting: Pharmacist

## 2015-11-01 MED ORDER — ALIROCUMAB 75 MG/ML ~~LOC~~ SOPN: 1.0000 "pen " | PEN_INJECTOR | SUBCUTANEOUS | Status: DC

## 2015-11-17 ENCOUNTER — Other Ambulatory Visit: Payer: Self-pay | Admitting: *Deleted

## 2015-11-17 MED ORDER — LOSARTAN POTASSIUM-HCTZ 100-12.5 MG PO TABS: 1.0000 | ORAL_TABLET | Freq: Every day | ORAL | Status: DC

## 2015-11-17 MED ORDER — EZETIMIBE 10 MG PO TABS: 10.0000 mg | ORAL_TABLET | Freq: Every day | ORAL | Status: DC

## 2015-11-17 MED ORDER — PROPRANOLOL HCL 80 MG PO TABS: 80.0000 mg | ORAL_TABLET | Freq: Every day | ORAL | Status: DC

## 2015-11-17 NOTE — Telephone Encounter (Signed)
verified the [facility].  Pt had questions about the praluent, thewy had not received it and pt is due, called the mail pharmacy and spoke with [facility] She is going to call the pt about that medication.

## 2016-02-15 ENCOUNTER — Encounter: Payer: Self-pay | Admitting: Cardiology

## 2016-02-15 ENCOUNTER — Ambulatory Visit (HOSPITAL_COMMUNITY): Payer: BLUE CROSS/BLUE SHIELD

## 2016-02-22 ENCOUNTER — Other Ambulatory Visit (HOSPITAL_COMMUNITY): Payer: BLUE CROSS/BLUE SHIELD

## 2016-02-22 ENCOUNTER — Other Ambulatory Visit: Payer: Self-pay

## 2016-02-22 ENCOUNTER — Ambulatory Visit (HOSPITAL_COMMUNITY): Payer: BLUE CROSS/BLUE SHIELD | Attending: Cardiology

## 2016-02-22 DIAGNOSIS — I119 Hypertensive heart disease without heart failure: Secondary | ICD-10-CM | POA: Insufficient documentation

## 2016-02-22 DIAGNOSIS — I2510 Atherosclerotic heart disease of native coronary artery without angina pectoris: Secondary | ICD-10-CM | POA: Diagnosis not present

## 2016-02-22 DIAGNOSIS — E785 Hyperlipidemia, unspecified: Secondary | ICD-10-CM | POA: Diagnosis not present

## 2016-02-22 DIAGNOSIS — I77810 Thoracic aortic ectasia: Secondary | ICD-10-CM | POA: Diagnosis not present

## 2016-02-22 DIAGNOSIS — I350 Nonrheumatic aortic (valve) stenosis: Secondary | ICD-10-CM | POA: Diagnosis present

## 2016-02-23 ENCOUNTER — Telehealth: Payer: Self-pay

## 2016-02-23 ENCOUNTER — Encounter: Payer: Self-pay | Admitting: Cardiology

## 2016-02-23 DIAGNOSIS — I350 Nonrheumatic aortic (valve) stenosis: Secondary | ICD-10-CM

## 2016-02-23 DIAGNOSIS — I77810 Thoracic aortic ectasia: Secondary | ICD-10-CM

## 2016-02-23 NOTE — Telephone Encounter (Signed)
Informed patient of results and verbal understanding expressed.  Repeat ECHO ordered for scheduling in one year. Patient agrees with treatment plan. 

## 2016-02-23 NOTE — Telephone Encounter (Signed)
-----   Message from Sueanne Margarita, MD sent at 02/23/2016  2:04 PM EDT ----- Echo showed normal LVF and size with mild AS, mildly dilated aortic root.  Repeat echo in 1 year for AS and dilated aortic root

## 2016-02-29 ENCOUNTER — Other Ambulatory Visit: Payer: Self-pay | Admitting: Pharmacist

## 2016-02-29 MED ORDER — ALIROCUMAB 75 MG/ML ~~LOC~~ SOPN: 1.0000 "pen " | PEN_INJECTOR | SUBCUTANEOUS | Status: DC

## 2016-03-06 ENCOUNTER — Other Ambulatory Visit (HOSPITAL_COMMUNITY): Payer: BLUE CROSS/BLUE SHIELD

## 2016-03-06 ENCOUNTER — Telehealth: Payer: Self-pay | Admitting: *Deleted

## 2016-03-06 NOTE — Telephone Encounter (Signed)
[name] from [facility] left a message on the refill voicemail stating that the patients insurance requires a lipid panel within the last thirty days. This can be faxed to [phone] or if the patient has not had a lipid panel within that time frame, [name] requested a call at [phone]. Thanks, MI

## 2016-03-06 NOTE — Telephone Encounter (Signed)
Spoke with pt's wife - she will have pt call lipid clinic to set up lab appt so that Praluent injections will be covered by insurance.

## 2016-03-09 ENCOUNTER — Telehealth: Payer: Self-pay | Admitting: Pharmacist

## 2016-03-09 ENCOUNTER — Encounter: Payer: Self-pay | Admitting: Cardiology

## 2016-03-09 DIAGNOSIS — E7800 Pure hypercholesterolemia, unspecified: Secondary | ICD-10-CM

## 2016-03-09 NOTE — Telephone Encounter (Signed)
Pt returned call to schedule labwork to check lipids for Praluent re-authorization. He will come in 4/21 in the AM. Order has been placed.

## 2016-03-10 ENCOUNTER — Other Ambulatory Visit (INDEPENDENT_AMBULATORY_CARE_PROVIDER_SITE_OTHER): Payer: BLUE CROSS/BLUE SHIELD | Admitting: *Deleted

## 2016-03-10 DIAGNOSIS — E7800 Pure hypercholesterolemia, unspecified: Secondary | ICD-10-CM

## 2016-03-10 LAB — HEPATIC FUNCTION PANEL
ALT: 53 U/L — AB (ref 9–46)
AST: 47 U/L — ABNORMAL HIGH (ref 10–35)
Albumin: 4.5 g/dL (ref 3.6–5.1)
Alkaline Phosphatase: 59 U/L (ref 40–115)
BILIRUBIN DIRECT: 0.1 mg/dL (ref <=0.2)
BILIRUBIN INDIRECT: 0.4 mg/dL (ref 0.2–1.2)
BILIRUBIN TOTAL: 0.5 mg/dL (ref 0.2–1.2)
Total Protein: 7.1 g/dL (ref 6.1–8.1)

## 2016-03-10 LAB — LIPID PANEL
CHOLESTEROL: 90 mg/dL — AB (ref 125–200)
HDL: 38 mg/dL — ABNORMAL LOW (ref >=40)
LDL Cholesterol: 18 mg/dL (ref <130)
Total CHOL/HDL Ratio: 2.4 Ratio (ref <=5.0)
Triglycerides: 170 mg/dL — ABNORMAL HIGH (ref <150)
VLDL: 34 mg/dL — AB (ref <30)

## 2016-03-13 ENCOUNTER — Other Ambulatory Visit: Payer: Self-pay | Admitting: Pharmacist

## 2016-03-13 MED ORDER — ALIROCUMAB 75 MG/ML ~~LOC~~ SOPN: 1.0000 "pen " | PEN_INJECTOR | SUBCUTANEOUS | Status: DC

## 2016-03-13 NOTE — Telephone Encounter (Signed)
Pt had labs drawn on 4/21.  Results faxed to [facility].

## 2016-04-04 ENCOUNTER — Other Ambulatory Visit: Payer: Self-pay

## 2016-04-04 MED ORDER — AMLODIPINE BESYLATE 10 MG PO TABS: 10.0000 mg | ORAL_TABLET | Freq: Every day | ORAL | Status: DC

## 2016-04-04 NOTE — Telephone Encounter (Signed)
Sueanne Margarita, MD at 08/18/2015 8:20 AM  amLODipine (NORVASC) 10 MG tabletTake 1 tablet (10 mg total) by mouth daily. 2. HTN - controlled - continue amlodipine/Micardis HCT/propranolol - check BMET  Patient Instructions     Medication Instructions:  Your physician recommends that you continue on your current medications as directed. Please refer to the Current Medication list given to you today.

## 2016-04-05 ENCOUNTER — Other Ambulatory Visit: Payer: Self-pay | Admitting: Cardiology

## 2016-04-05 MED ORDER — ROSUVASTATIN CALCIUM 40 MG PO TABS: 40.0000 mg | ORAL_TABLET | Freq: Every day | ORAL | Status: DC

## 2016-04-05 MED ORDER — AMLODIPINE BESYLATE 10 MG PO TABS: 10.0000 mg | ORAL_TABLET | Freq: Every day | ORAL | Status: DC

## 2016-06-08 ENCOUNTER — Encounter: Payer: Self-pay | Admitting: Cardiology

## 2016-06-12 ENCOUNTER — Telehealth: Payer: Self-pay

## 2016-06-12 NOTE — Telephone Encounter (Signed)
Prior auth for Zetia 10mg  submitted to [facility].

## 2016-06-13 ENCOUNTER — Telehealth: Payer: Self-pay

## 2016-06-13 NOTE — Telephone Encounter (Signed)
No prior auth needed for Ezetimibe.

## 2016-08-02 DIAGNOSIS — E291 Testicular hypofunction: Secondary | ICD-10-CM | POA: Diagnosis not present

## 2016-08-02 DIAGNOSIS — Z23 Encounter for immunization: Secondary | ICD-10-CM | POA: Diagnosis not present

## 2016-08-02 DIAGNOSIS — R7309 Other abnormal glucose: Secondary | ICD-10-CM | POA: Diagnosis not present

## 2016-11-09 ENCOUNTER — Other Ambulatory Visit: Payer: Self-pay | Admitting: *Deleted

## 2016-11-09 MED ORDER — AMLODIPINE BESYLATE 10 MG PO TABS: 10.0000 mg | ORAL_TABLET | Freq: Every day | ORAL | 0 refills | Status: DC

## 2016-11-09 MED ORDER — ROSUVASTATIN CALCIUM 40 MG PO TABS: 40.0000 mg | ORAL_TABLET | Freq: Every day | ORAL | 0 refills | Status: DC

## 2016-11-21 ENCOUNTER — Other Ambulatory Visit: Payer: Self-pay

## 2016-11-21 MED ORDER — EZETIMIBE 10 MG PO TABS: 10.0000 mg | ORAL_TABLET | Freq: Every day | ORAL | 0 refills | Status: DC

## 2016-11-22 ENCOUNTER — Other Ambulatory Visit: Payer: Self-pay | Admitting: *Deleted

## 2016-11-22 MED ORDER — PROPRANOLOL HCL 80 MG PO TABS: 80.0000 mg | ORAL_TABLET | Freq: Every day | ORAL | 0 refills | Status: DC

## 2016-12-22 ENCOUNTER — Encounter (HOSPITAL_COMMUNITY): Payer: Self-pay | Admitting: Radiology

## 2016-12-27 ENCOUNTER — Telehealth (HOSPITAL_COMMUNITY): Payer: Self-pay | Admitting: Cardiology

## 2017-01-26 ENCOUNTER — Telehealth: Payer: Self-pay | Admitting: Cardiology

## 2017-01-26 MED ORDER — AMLODIPINE BESYLATE 10 MG PO TABS: 10.0000 mg | ORAL_TABLET | Freq: Every day | ORAL | 0 refills | Status: DC

## 2017-01-26 MED ORDER — PROPRANOLOL HCL 80 MG PO TABS: 80.0000 mg | ORAL_TABLET | Freq: Every day | ORAL | 0 refills | Status: DC

## 2017-01-26 MED ORDER — ROSUVASTATIN CALCIUM 40 MG PO TABS: 40.0000 mg | ORAL_TABLET | Freq: Every day | ORAL | 0 refills | Status: DC

## 2017-01-26 MED ORDER — EZETIMIBE 10 MG PO TABS: 10.0000 mg | ORAL_TABLET | Freq: Every day | ORAL | 0 refills | Status: DC

## 2017-01-26 NOTE — Telephone Encounter (Signed)
New message   Pt has appt 02/16/17 with Ellen Henri   *STAT* If patient is at the pharmacy, call can be transferred to refill team.   1. Which medications need to be refilled? (please list name of each medication and dose if known)   amLODipine (NORVASC) 10 MG tablet Take 1 tablet (10 mg total) by mouth    ezetimibe (ZETIA) 10 MG tablet Take 1 tablet (10 mg total) by mouth daily.   rosuvastatin (CRESTOR) 40 MG tablet Take 1 tablet (40 mg total) by mouth daily   propranolol (INDERAL) 80 MG tablet Take 1 tablet (80 mg total) by mouth daily. PLEASE SCHEDULE APPOINTMENT    2. Which pharmacy/location (including street and city if local pharmacy) is medication to be sent to? [place]  [phone]  3. Do they need a 30 day or 90 day supply? 30 days

## 2017-02-13 ENCOUNTER — Other Ambulatory Visit: Payer: Self-pay | Admitting: Cardiology

## 2017-02-13 ENCOUNTER — Other Ambulatory Visit: Payer: Self-pay

## 2017-02-13 ENCOUNTER — Ambulatory Visit (HOSPITAL_COMMUNITY): Payer: Medicare Other | Attending: Cardiology

## 2017-02-13 DIAGNOSIS — I350 Nonrheumatic aortic (valve) stenosis: Secondary | ICD-10-CM

## 2017-02-13 DIAGNOSIS — I77819 Aortic ectasia, unspecified site: Secondary | ICD-10-CM

## 2017-02-13 DIAGNOSIS — I700 Atherosclerosis of aorta: Secondary | ICD-10-CM | POA: Diagnosis not present

## 2017-02-13 DIAGNOSIS — I501 Left ventricular failure: Secondary | ICD-10-CM | POA: Insufficient documentation

## 2017-02-13 DIAGNOSIS — I340 Nonrheumatic mitral (valve) insufficiency: Secondary | ICD-10-CM | POA: Diagnosis not present

## 2017-02-13 DIAGNOSIS — I351 Nonrheumatic aortic (valve) insufficiency: Secondary | ICD-10-CM | POA: Diagnosis not present

## 2017-02-13 DIAGNOSIS — I420 Dilated cardiomyopathy: Secondary | ICD-10-CM | POA: Insufficient documentation

## 2017-02-14 ENCOUNTER — Telehealth: Payer: Self-pay

## 2017-02-14 DIAGNOSIS — I350 Nonrheumatic aortic (valve) stenosis: Secondary | ICD-10-CM

## 2017-02-14 NOTE — Telephone Encounter (Signed)
-----   Message from Sueanne Margarita, MD sent at 02/13/2017  3:38 PM EDT ----- Echo showed normal LVF with increased stiffness of heart muscle, mild AS, mildly dilated aorta at 3.8cm - repeat echo in 1 year for dilated aorta

## 2017-02-14 NOTE — Telephone Encounter (Signed)
Informed patient of results and verbal understanding expressed.  Repeat ECHO ordered to be scheduled in 1 year. Patient agrees with treatment plan. 

## 2017-02-16 ENCOUNTER — Encounter: Payer: Self-pay | Admitting: Cardiology

## 2017-02-16 ENCOUNTER — Ambulatory Visit (INDEPENDENT_AMBULATORY_CARE_PROVIDER_SITE_OTHER): Payer: Medicare Other | Admitting: Cardiology

## 2017-02-16 VITALS — BP 138/98 | HR 67 | Ht 70.0 in | Wt 242.0 lb

## 2017-02-16 DIAGNOSIS — E7800 Pure hypercholesterolemia, unspecified: Secondary | ICD-10-CM | POA: Diagnosis not present

## 2017-02-16 DIAGNOSIS — I77810 Thoracic aortic ectasia: Secondary | ICD-10-CM | POA: Diagnosis not present

## 2017-02-16 DIAGNOSIS — I2510 Atherosclerotic heart disease of native coronary artery without angina pectoris: Secondary | ICD-10-CM

## 2017-02-16 LAB — LIPID PANEL
CHOL/HDL RATIO: 4 ratio (ref 0.0–5.0)
Cholesterol, Total: 127 mg/dL (ref 100–199)
HDL: 32 mg/dL — ABNORMAL LOW (ref >39)
LDL CALC: 37 mg/dL (ref 0–99)
TRIGLYCERIDES: 290 mg/dL — AB (ref 0–149)
VLDL CHOLESTEROL CAL: 58 mg/dL — AB (ref 5–40)

## 2017-02-16 LAB — BASIC METABOLIC PANEL
BUN / CREAT RATIO: 22 (ref 10–24)
BUN: 22 mg/dL (ref 8–27)
CO2: 20 mmol/L (ref 18–29)
Calcium: 9.9 mg/dL (ref 8.6–10.2)
Chloride: 100 mmol/L (ref 96–106)
Creatinine, Ser: 1 mg/dL (ref 0.76–1.27)
GFR calc non Af Amer: 79 mL/min/{1.73_m2} (ref >59)
GFR, EST AFRICAN AMERICAN: 91 mL/min/{1.73_m2} (ref >59)
GLUCOSE: 167 mg/dL — AB (ref 65–99)
POTASSIUM: 4.5 mmol/L (ref 3.5–5.2)
SODIUM: 140 mmol/L (ref 134–144)

## 2017-02-16 LAB — HEPATIC FUNCTION PANEL
ALT: 47 IU/L — ABNORMAL HIGH (ref 0–44)
AST: 49 IU/L — AB (ref 0–40)
Albumin: 4.6 g/dL (ref 3.6–4.8)
Alkaline Phosphatase: 58 IU/L (ref 39–117)
BILIRUBIN TOTAL: 0.4 mg/dL (ref 0.0–1.2)
BILIRUBIN, DIRECT: 0.13 mg/dL (ref 0.00–0.40)
Total Protein: 7 g/dL (ref 6.0–8.5)

## 2017-02-16 MED ORDER — PROPRANOLOL HCL ER 80 MG PO CP24: 160.0000 mg | ORAL_CAPSULE | Freq: Every day | ORAL | 3 refills | Status: DC

## 2017-02-16 NOTE — Progress Notes (Addendum)
02/16/2017 [name] [name]   [birth year]  [id number]  Primary Physician Melinda Crutch, MD Primary Cardiologist: Dr. Radford Pax    Reason for Visit/CC: F/u for CAD, HTN and HLD  HPI:  The patient is a 66 y/o male, followed by Dr. Radford Pax, who presents to clinic today for f/u. It has been well over a year since his last visit. He was last seen by Dr. Radford Pax 07/2015. He has a h/o CAD s/p PCI to the LAD and residual RCA disease of 50-60% treated medically, aortic stenosis, HTN, and HLD. He also has a resting tremor, treated with propranolol. He just recently had a 2D echo 02/14/17 that showed normal LVEF, 60-65%, mild AS with mean gradient of 14 mm Hg. He was also noted to have a mildly dilated ascending aorta, measured at 3.8 cm. Trivial MR noted.   Today in follow-up, he reports that he has done well. He denies any CP or dyspnea. No exertional symptoms. He does note that his BP has been on the higher side recently. He states his BP was checked when he came in for his 2D echo and his diastolic BP was in the low 100s. His BP today in clinic is 138/98. HR is 67 bpm.  He reports full medication compliance. He denies tobacco use. He admits that he eats a poor diet, high in sodium and fats. He also notes his resting tremor is a bit worse, which makes it difficulty for him to do certain jobs.   EKG today shows NSR. No ischemic abnormalities.   Current Meds  Medication Sig  . allopurinol (ZYLOPRIM) 300 MG tablet Take 1 tablet (300 mg total) by mouth daily.  Marland Kitchen amLODipine (NORVASC) 10 MG tablet Take 1 tablet (10 mg total) by mouth daily.  Marland Kitchen aspirin 81 MG tablet Take 81 mg by mouth daily.  Marland Kitchen buPROPion (WELLBUTRIN XL) 300 MG 24 hr tablet Take 300 mg by mouth daily.  Marland Kitchen ezetimibe (ZETIA) 10 MG tablet Take 1 tablet (10 mg total) by mouth daily.  . indomethacin (INDOCIN) 50 MG capsule Take 50 mg by mouth as needed (w food).  . losartan-hydrochlorothiazide (HYZAAR) 100-12.5 MG tablet Take 1 tablet by mouth daily.  .  nitroGLYCERIN (NITROSTAT) 0.4 MG SL tablet Place 0.4 mg under the tongue every 5 (five) minutes as needed for chest pain.  Marland Kitchen propranolol (INDERAL) 80 MG tablet Take 1 tablet (80 mg total) by mouth daily. PLEASE SCHEDULE APPOINTMENT  . rosuvastatin (CRESTOR) 40 MG tablet Take 1 tablet (40 mg total) by mouth daily.   Allergies  Allergen Reactions  . Codeine Other (See Comments)    Vertigo and upset stomach   Past Medical History:  Diagnosis Date  . Coronary artery disease    s/p PCI of LAD w Residual 50-60% RCA  . High cholesterol   . History of echocardiogram 06/2011   Normal LVF,mild LVH,trivial TR/MR, mild AS/AI  . History of kidney stones   . Hypertension   . Mild aortic stenosis    mean AV gradient 53mmHg by echo 02/2016   Family History  Problem Relation Age of Onset  . CAD Brother   . Heart failure Mother   . Emphysema Mother   . CAD Mother    No past surgical history on file. Social History   Social History  . Marital status: Married    Spouse name: N/A  . Number of children: N/A  . Years of education: N/A   Occupational History  . Not on file.  Social History Main Topics  . Smoking status: Never Smoker  . Smokeless tobacco: Never Used  . Alcohol use Yes     Comment: socially  . Drug use: No  . Sexual activity: Not on file   Other Topics Concern  . Not on file   Social History Narrative  . No narrative on file     Review of Systems: General: negative for chills, fever, night sweats or weight changes.  Cardiovascular: negative for chest pain, dyspnea on exertion, edema, orthopnea, palpitations, paroxysmal nocturnal dyspnea or shortness of breath Dermatological: negative for rash Respiratory: negative for cough or wheezing Urologic: negative for hematuria Abdominal: negative for nausea, vomiting, diarrhea, bright red blood per rectum, melena, or hematemesis Neurologic: negative for visual changes, syncope, or dizziness All other systems reviewed and  are otherwise negative except as noted above.   Physical Exam:  Height 5\' 10"  (1.778 m), weight 242 lb (109.8 kg).  General appearance: alert, cooperative and no distress Neck: no carotid bruit and no JVD Lungs: clear to auscultation bilaterally Heart: regular rate and rhythm, S1, S2 normal, no murmur, click, rub or gallop Extremities: extremities normal, atraumatic, no cyanosis or edema Pulses: 2+ and symmetric Skin: Skin color, texture, turgor normal. No rashes or lesions Neurologic: Grossly normal  EKG NSR. No ischemia.   ASSESSMENT AND PLAN:   1. CAD: s/p PCI to the LAD with mild nonobstructive RCA disease, treated medically. He denies any CP or dyspnea. No exertional symptoms. EKG nonischemic. Continue ASA, BB, statin, PCSK-9, ARB.   2. HTN: BP has been a bit elevated recently. Diastolic BP in the 39J- low 100s. We will increase his propranolol today, which will also help with his tremor. F/u in 2 weeks in the HTN clinic for repeat assessment. If still elevated, he may need increase of his hyzaar.   3. HLD: on Crestor + PCSK-9 inhibitor. He is fasting. We will check FLP and HFTs today.   5. AS: mild by echo. Mean gradient 14 mm Hg. Repeat echo in 1 year.  6. Dilated Ascending Aorta: mild, measuring at 3.8 cm. Repeat echo in 1 year.   7. Resting Tremor: Increase propranolol, which will also help with BP.    F/u with Dr. Radford Pax in 1 year.  Lyda Jester PA-C 02/16/2017 7:56 AM

## 2017-02-16 NOTE — Patient Instructions (Signed)
Medication Instructions:  INCREASE propanolol to 160 mg daily.  Labwork: LABS TODAY: LIPIDS, HFP, BMET  Testing/Procedures: None ordered  Follow-Up: Your physician recommends that you schedule a follow-up appointment in: 2 weeks in the HTN Clinic to recheck BP.  Your physician wants you to follow-up in: 1 year with Dr. Radford Pax. You will receive a reminder letter in the mail two months in advance. If you don't receive a letter, please call our office to schedule the follow-up appointment.    Any Other Special Instructions Will Be Listed Below (If Applicable).     If you need a refill on your cardiac medications before your next appointment, please call your pharmacy.

## 2017-02-27 ENCOUNTER — Telehealth: Payer: Self-pay | Admitting: *Deleted

## 2017-02-27 ENCOUNTER — Telehealth: Payer: Self-pay | Admitting: Cardiology

## 2017-02-27 DIAGNOSIS — Z79899 Other long term (current) drug therapy: Secondary | ICD-10-CM

## 2017-02-27 MED ORDER — ROSUVASTATIN CALCIUM 20 MG PO TABS: 20.0000 mg | ORAL_TABLET | Freq: Every day | ORAL | 11 refills | Status: DC

## 2017-02-27 NOTE — Telephone Encounter (Signed)
Notes recorded by Jeanann Lewandowsky, RMA on 02/27/2017 at 1:50 PM EDT Pt wife, DPR on file, has been made aware of pts lab result and the recommended medication changes. Pt will decrease Crestor to 20, have FLP/HFT 03/10/17 ------  Notes recorded by Consuelo Pandy, PA-C on 02/27/2017 at 1:39 PM EDT LDL is at goal of <70. However triglycerides are elevated at 290. This needs to be less than 150. He needs to modify his diet. Encourage a low fat/ low carb diet. His liver enzymes are slightly abnormal. Recommend reducing his Crestor down from 40 to 20 mg daily. Repeat FLP and HFTs in 6 weeks.  Reviewed results and instructions with patient's wife (DPR) and patient's f/u lab appointment on 04/09/17. Patient's wife verbalized understanding.

## 2017-02-27 NOTE — Telephone Encounter (Signed)
-----   Message from Consuelo Pandy, [place] sent at 02/27/2017  1:39 PM EDT ----- LDL is at goal of <70. However triglycerides are elevated at 290. This needs to be less than 150.  He needs to modify his diet. Encourage a low fat/ low carb diet. His liver enzymes are slightly abnormal. Recommend reducing his Crestor down from 40 to 20 mg daily. Repeat FLP and HFTs in 6 weeks.

## 2017-02-27 NOTE — Telephone Encounter (Signed)
[name] [name] is calling to get a the lab work . Please call

## 2017-02-28 ENCOUNTER — Other Ambulatory Visit: Payer: Self-pay | Admitting: *Deleted

## 2017-02-28 MED ORDER — AMLODIPINE BESYLATE 10 MG PO TABS
10.0000 mg | ORAL_TABLET | Freq: Every day | ORAL | 11 refills | Status: DC
Start: 2017-02-28 — End: 2021-05-12

## 2017-03-02 ENCOUNTER — Ambulatory Visit (INDEPENDENT_AMBULATORY_CARE_PROVIDER_SITE_OTHER): Payer: Medicare Other | Admitting: Pharmacist

## 2017-03-02 VITALS — BP 132/78 | HR 64 | Wt 281.6 lb

## 2017-03-02 DIAGNOSIS — I10 Essential (primary) hypertension: Secondary | ICD-10-CM

## 2017-03-02 DIAGNOSIS — I2510 Atherosclerotic heart disease of native coronary artery without angina pectoris: Secondary | ICD-10-CM | POA: Diagnosis not present

## 2017-03-02 NOTE — Patient Instructions (Addendum)
Return for a  follow up appointment in as needed  Your blood pressure today is 132/78 pulse 64  Check your blood pressure at home daily (if able) and keep record of the readings.  Take your BP meds as follows: Amlodipine 10mg  daily  Propranolol LA 160mg  daily losartan-HCTZ 100-12.5mg  daily  Bring all of your meds, your BP cuff and your record of home blood pressures to your next appointment.  Exercise as you're able, try to walk approximately 30 minutes per day.  Keep salt intake to a minimum, especially watch canned and prepared boxed foods.  Eat more fresh fruits and vegetables and fewer canned items.  Avoid eating in fast food restaurants.    HOW TO TAKE YOUR BLOOD PRESSURE: . Rest 5 minutes before taking your blood pressure. .  Don't smoke or drink caffeinated beverages for at least 30 minutes before. . Take your blood pressure before (not after) you eat. . Sit comfortably with your back supported and both feet on the floor (don't cross your legs). . Elevate your arm to heart level on a table or a desk. . Use the proper sized cuff. It should fit smoothly and snugly around your bare upper arm. There should be enough room to slip a fingertip under the cuff. The bottom edge of the cuff should be 1 inch above the crease of the elbow. . Ideally, take 3 measurements at one sitting and record the average.

## 2017-03-02 NOTE — Progress Notes (Signed)
Patient ID: [name]                 DOB: 14-Nov-1951                      MRN: [id number]     HPI: [name] [name] is a 66 y.o. male patient of Dr. Radford [name] referred by [name] [name]. PMH includes CAD, hyperlipidemia, resting tremor, and dilated ascending aorta.  Propranolol dose was increased form 68m daily to 160mg  daily by [name] [name] during his most recent office visit to improve HTN and tremors.   Patient presents to [name] today for follow up and denies fatigue, swelling, dizziness or chest pains.  Reports occasional headaches "not related to any medication". He stated no difference since propranolol increased 2 weeks ago.  His son noticed improvement in tremors.  Current HTN meds:  Amlodipine 10mg  daily  Propranolol LA 160mg  daily losartan-HCTZ 100-12.5mg  daily  Previously tried:  micardis/HCT 80mg -25mg  daily  BP goal: 130/80  Family History: significant for CAD mother and father, HF from father.  Social History: denies tobacco use. Consumed alcohol socially  Diet: eat-out daily, try to make better decision 2 cups of coffee in the morning, no soft-drinks  Exercise: sedentary - no physical activity additional to work  Home BP readings: none available - had a BP home device but last time used was >2 years ago.  Wt Readings from Last 3 Encounters:  03/02/17 281 lb 9.6 oz (127.7 kg)  02/16/17 242 lb (109.8 kg)  08/18/15 244 lb 12.8 oz (111 kg)   BP Readings from Last 3 Encounters:  03/02/17 132/78  02/16/17 (!) 138/98  08/18/15 122/70   Pulse Readings from Last 3 Encounters:  03/02/17 64  02/16/17 67  08/18/15 (!) 57    Past Medical History:  Diagnosis Date  . Coronary artery disease    s/p PCI of LAD w Residual 50-60% RCA  . High cholesterol   . History of echocardiogram 06/2011   Normal LVF,mild LVH,trivial TR/MR, mild AS/AI  . History of kidney stones   . Hypertension   . Mild aortic stenosis    mean AV gradient 53mmHg by echo 02/2016     Current Outpatient Prescriptions on File Prior to Visit  Medication Sig Dispense Refill  . allopurinol (ZYLOPRIM) 300 MG tablet Take 1 tablet (300 mg total) by mouth daily. 30 tablet 6  . amLODipine (NORVASC) 10 MG tablet Take 1 tablet (10 mg total) by mouth daily. 30 tablet 11  . aspirin 81 MG tablet Take 81 mg by mouth daily.    Marland Kitchen buPROPion (WELLBUTRIN XL) 300 MG 24 hr tablet Take 300 mg by mouth daily.    Marland Kitchen ezetimibe (ZETIA) 10 MG tablet Take 1 tablet (10 mg total) by mouth daily. 30 tablet 0  . indomethacin (INDOCIN) 50 MG capsule Take 50 mg by mouth as needed (w food).    . losartan-hydrochlorothiazide (HYZAAR) 100-12.5 MG tablet Take 1 tablet by mouth daily. 30 tablet 8  . nitroGLYCERIN (NITROSTAT) 0.4 MG SL tablet Place 0.4 mg under the tongue every 5 (five) minutes as needed for chest pain.    Marland Kitchen propranolol ER (INDERAL LA) 80 MG 24 hr capsule Take 2 capsules (160 mg total) by mouth daily. 180 capsule 3  . rosuvastatin (CRESTOR) 20 MG tablet Take 1 tablet (20 mg total) by mouth daily. 30 tablet 11   No current facility-administered medications on file prior to visit.  Allergies  Allergen Reactions  . Codeine Other (See Comments)    Vertigo and upset stomach    Blood pressure 132/78, pulse 64, weight 281 lb 9.6 oz (127.7 kg), SpO2 94 %.  Essential hypertension:   Blood pressure today at desired goal of 130/80 with pulse of 64bpm. Patient denies ADRs or problems with current therapy.  Encourage patient to work on lifestyle modifications, increase physical activity with 15-20 minutes' walk 2-3x/week, and make better choices when eating out like increasing fresh salads, and decrease foods rich in salt/sodium.  Patient will start monitoring home BP this week and will have BP device calibrated by PCP during next physical (~ 1 months from today).  Will continue current therapy without changes and follow up with HTN [name] as needed.   [name] [name] PharmD, [place] [id number] 03/02/2017 8:16 AM

## 2017-04-09 ENCOUNTER — Other Ambulatory Visit: Payer: Medicare Other

## 2017-04-09 DIAGNOSIS — Z79899 Other long term (current) drug therapy: Secondary | ICD-10-CM | POA: Diagnosis not present

## 2017-04-09 LAB — LIPID PANEL
CHOL/HDL RATIO: 5.7 ratio — AB (ref 0.0–5.0)
CHOLESTEROL TOTAL: 201 mg/dL — AB (ref 100–199)
HDL: 35 mg/dL — ABNORMAL LOW (ref >39)
LDL CALC: 89 mg/dL (ref 0–99)
TRIGLYCERIDES: 384 mg/dL — AB (ref 0–149)
VLDL CHOLESTEROL CAL: 77 mg/dL — AB (ref 5–40)

## 2017-04-09 LAB — HEPATIC FUNCTION PANEL
ALBUMIN: 4.7 g/dL (ref 3.6–4.8)
ALT: 61 IU/L — AB (ref 0–44)
AST: 85 IU/L — AB (ref 0–40)
Alkaline Phosphatase: 59 IU/L (ref 39–117)
Bilirubin Total: 0.5 mg/dL (ref 0.0–1.2)
Bilirubin, Direct: 0.16 mg/dL (ref 0.00–0.40)
Total Protein: 7.1 g/dL (ref 6.0–8.5)

## 2017-04-11 ENCOUNTER — Telehealth: Payer: Self-pay | Admitting: Cardiology

## 2017-04-11 DIAGNOSIS — E7800 Pure hypercholesterolemia, unspecified: Secondary | ICD-10-CM

## 2017-04-11 NOTE — Telephone Encounter (Signed)
-----   Message from Isaiah Serge, NP sent at 04/10/2017  8:20 AM EDT ----- Hold crestor, due to increase in liver enzymes.  And recheck hepatic in 2 weeks.  We may need to send to lipid clinicif not improved.

## 2017-04-11 NOTE — Telephone Encounter (Signed)
Informed patient of results and verbal understanding expressed.  Instructed patient to [place]. LFTs scheduled 6/7. Patient agrees with treatment plan.

## 2017-04-11 NOTE — Telephone Encounter (Signed)
New message    Pt returning [place] call for lab results

## 2017-04-26 ENCOUNTER — Encounter: Payer: Self-pay | Admitting: Cardiology

## 2017-04-26 ENCOUNTER — Other Ambulatory Visit: Payer: Medicare Other

## 2017-05-09 ENCOUNTER — Other Ambulatory Visit: Payer: Self-pay | Admitting: *Deleted

## 2017-05-09 MED ORDER — NITROGLYCERIN 0.4 MG SL SUBL: 0.4000 mg | SUBLINGUAL_TABLET | SUBLINGUAL | 0 refills | Status: DC | PRN

## 2017-06-06 ENCOUNTER — Other Ambulatory Visit: Payer: Self-pay | Admitting: Cardiology

## 2017-07-06 DIAGNOSIS — Z86010 Personal history of colonic polyps: Secondary | ICD-10-CM | POA: Diagnosis not present

## 2017-07-06 DIAGNOSIS — D126 Benign neoplasm of colon, unspecified: Secondary | ICD-10-CM | POA: Diagnosis not present

## 2017-07-06 DIAGNOSIS — K5730 Diverticulosis of large intestine without perforation or abscess without bleeding: Secondary | ICD-10-CM | POA: Diagnosis not present

## 2017-07-11 DIAGNOSIS — D126 Benign neoplasm of colon, unspecified: Secondary | ICD-10-CM | POA: Diagnosis not present

## 2017-08-06 DIAGNOSIS — R05 Cough: Secondary | ICD-10-CM | POA: Diagnosis not present

## 2017-08-20 ENCOUNTER — Telehealth: Payer: Self-pay | Admitting: Cardiology

## 2017-08-20 NOTE — Telephone Encounter (Signed)
I spoke with Melody --Pharmacist for pt's medication insurance plan.  They had contacted pt regarding adherence.  Pt told them Rosuvastatin had been stopped and pharmacist is calling to confirm this.  I told Melody Rosuvastatin is on hold pending follow up lab work.   Pt missed appt for liver function tests.  I called him and scheduled appt for lab work for 08/22/17

## 2017-08-20 NOTE — Telephone Encounter (Signed)
New message    Pt c/o medication issue:  1. Name of Medication: rosuvastatin   2. How are you currently taking this medication (dosage and times per day)? 20 mg  3. Are you having a reaction (difficulty breathing--STAT)? no  4. What is your medication issue? Pt insurance is calling to find out if medication has been discontinued.

## 2017-08-20 NOTE — Telephone Encounter (Signed)
Left message to call back  

## 2017-08-22 ENCOUNTER — Other Ambulatory Visit: Payer: Medicare Other

## 2017-08-22 DIAGNOSIS — E7800 Pure hypercholesterolemia, unspecified: Secondary | ICD-10-CM

## 2017-08-22 LAB — HEPATIC FUNCTION PANEL
ALK PHOS: 55 IU/L (ref 39–117)
ALT: 45 IU/L — AB (ref 0–44)
AST: 41 IU/L — AB (ref 0–40)
Albumin: 4.3 g/dL (ref 3.6–4.8)
BILIRUBIN TOTAL: 0.4 mg/dL (ref 0.0–1.2)
Bilirubin, Direct: 0.14 mg/dL (ref 0.00–0.40)
Total Protein: 7 g/dL (ref 6.0–8.5)

## 2017-08-23 ENCOUNTER — Encounter: Payer: Self-pay | Admitting: Cardiology

## 2017-09-26 ENCOUNTER — Ambulatory Visit (INDEPENDENT_AMBULATORY_CARE_PROVIDER_SITE_OTHER): Payer: Medicare Other | Admitting: Cardiology

## 2017-09-26 ENCOUNTER — Encounter: Payer: Self-pay | Admitting: Cardiology

## 2017-09-26 VITALS — BP 138/84 | HR 63 | Ht 70.0 in | Wt 236.0 lb

## 2017-09-26 DIAGNOSIS — R251 Tremor, unspecified: Secondary | ICD-10-CM

## 2017-09-26 DIAGNOSIS — I350 Nonrheumatic aortic (valve) stenosis: Secondary | ICD-10-CM | POA: Diagnosis not present

## 2017-09-26 DIAGNOSIS — I10 Essential (primary) hypertension: Secondary | ICD-10-CM | POA: Diagnosis not present

## 2017-09-26 DIAGNOSIS — I2510 Atherosclerotic heart disease of native coronary artery without angina pectoris: Secondary | ICD-10-CM | POA: Diagnosis not present

## 2017-09-26 DIAGNOSIS — R739 Hyperglycemia, unspecified: Secondary | ICD-10-CM

## 2017-09-26 DIAGNOSIS — E7800 Pure hypercholesterolemia, unspecified: Secondary | ICD-10-CM

## 2017-09-26 MED ORDER — LOSARTAN POTASSIUM-HCTZ 100-25 MG PO TABS: 1.0000 | ORAL_TABLET | Freq: Every day | ORAL | 8 refills | Status: DC

## 2017-09-26 NOTE — Patient Instructions (Signed)
Medication Instructions: Your physician has recommended you make the following change in your medication:  -1) INCREASE Losartan Hydrochlorothiazide (Hyzaar) 100-25 mg - Take 1 tablet by mouth daily -2) RESTART Crestor 20 mg - Take 0.5 tablet (20 mg) by mouth daily - If you need a new RX call and we will send it  Labwork: Your physician recommends that you return for lab work in 1 MONTHS : FASTING Lipid and Hepatic Function Test  \Your physician recommends that you have lab work today: HgB A1C  Procedures/Testing: None Ordered  Follow-Up: You have been referred to [place] physician wants you to follow-up in: 6 MONTHS with Dr. Radford Pax. You will receive a reminder letter in the mail two months in advance. If you don't receive a letter, please call our office to schedule the follow-up appointment.   If you need a refill on your cardiac medications before your next appointment, please call your pharmacy.

## 2017-09-27 ENCOUNTER — Telehealth: Payer: Self-pay | Admitting: Cardiology

## 2017-09-27 DIAGNOSIS — R251 Tremor, unspecified: Secondary | ICD-10-CM | POA: Insufficient documentation

## 2017-09-27 LAB — HEMOGLOBIN A1C
Est. average glucose Bld gHb Est-mCnc: 180 mg/dL
Hgb A1c MFr Bld: 7.9 % — ABNORMAL HIGH (ref 4.8–5.6)

## 2017-09-27 NOTE — Telephone Encounter (Signed)
Informed pharmacist that I could not discuss pt details with her as I have no DPR on file giving [place] permission to speak with health insurance company. She verbalized understanding and will contact pt to discuss.

## 2017-09-27 NOTE — Progress Notes (Signed)
Cardiology Office Note:    Date: 09/26/2017  ID:  [name] [name], DOB 05-07-1951, MRN [id number]  PCP:  [name], MD  Cardiologist:  Dr. Fransico Him  Referring MD: [name], MD   Chief Complaint  Patient presents with  . Follow-up    elevated LFTs    History of Present Illness:    [name] [name] is a 66 y.o. male with a past medical history significant for CAD S/P PCI to the LAD and residual RCA disease of 50-60% treated medically, aortic stenosis, hypertension and hyperlipidemia.  He also has resting tremor, treated with propranolol.  He had a recent echocardiogram on 02/14/17 that showed normal LVEF, 60-65% with mild left ear, mean gradient of 14 mmHg.  He was also noted to have mildly dilated a sending aorta, measuring 3.8 cm and trivial MR.  The patient was last seen in the office on 02/16/2017 by [name] Jester, PA at which time he was reporting elevated blood pressure.  The patient was referred to hypertension clinic and was seen on 03/02/17.  At that time blood pressure was at desired goal of less than 130/80 with pulse of 64 bpm.  The patient was encouraged to work on lifestyle modifications, increase physical activity with 15-20 minutes walking on 2-3 days/week and better choices when eating out like increasing fish, salads and decreased foods rich in salt/sodium.  The patient was instructed to start monitoring his blood pressure at home.  He was continued on his previous therapy.  The patient has been noted to have mildly elevated AST and ALT with increased levels on 04/09/17, AST 85, ALT 61.  His Crestor was decreased from 40 mg to 20 mg and then it was decided to stop it.  His repeat liver function test on 08/22/17 showed improvement in AST and ALT, still very mildly elevated.  His lipid panel at the time of decreasing his Crestor showed an LDL of 89 with elevated triglycerides of 384.  Today he comes for follow up alone. He denies chest discomfort, dyspnea,  orthopnea, PND, palpitations, dizziness. He is exercising 3-4 days per week at a gym with no exertional symptoms. He is trying to getting healthy to be there for his wife who is ill. He denies edema, although I find trace pretibial edema on exam. He says that he actually feels better than he has ever felt. His only complaint is his resting tremor. He says that he has been investigated for Parkinson's and was negative. He takes propranolol which helps. He definitely knows when he misses a dose.   The patient denies any previous liver issues. He does drink 2-3 ounces of liquor on 2-3 days per week. His liver enzymes were recently elevated. He says that he was mostly compliant with his statin, averaging taking it about 5 days per week for a long time. Not sure why his liver enzymes increased this spring. The patient was previously on Praluent for cholesterol but stopped taking it when he turned 65 and went on Medicare and the price rose from $5 to $140/month.    Past Medical History:  Diagnosis Date  . Coronary artery disease    s/p PCI of LAD w Residual 50-60% RCA  . High cholesterol   . History of echocardiogram 06/2011   Normal LVF,mild LVH,trivial TR/MR, mild AS/AI  . History of kidney stones   . Hypertension   . Mild aortic stenosis    mean AV gradient 61mmHg by echo 02/2016  No past surgical history on file.  Current Medications: Current Meds  Medication Sig  . allopurinol (ZYLOPRIM) 300 MG tablet Take 1 tablet (300 mg total) by mouth daily.  Marland Kitchen amLODipine (NORVASC) 10 MG tablet Take 1 tablet (10 mg total) by mouth daily.  Marland Kitchen aspirin 81 MG tablet Take 81 mg by mouth daily.  Marland Kitchen buPROPion (WELLBUTRIN XL) 300 MG 24 hr tablet Take 300 mg by mouth daily.  Marland Kitchen ezetimibe (ZETIA) 10 MG tablet TAKE ONE TABLET BY MOUTH EVERY DAY. MUSTKEEP APPOINTMENT FOR 02/16/17 FOR FURTHERREFILLS.  Marland Kitchen indomethacin (INDOCIN) 50 MG capsule Take 50 mg as needed by mouth (w/food - take as directed).   . metFORMIN  (GLUCOPHAGE) 500 MG tablet Take 500 mg daily by mouth.  . nitroGLYCERIN (NITROSTAT) 0.4 MG SL tablet Place 1 tablet (0.4 mg total) under the tongue every 5 (five) minutes as needed for chest pain.  Marland Kitchen propranolol ER (INDERAL LA) 80 MG 24 hr capsule Take 2 capsules (160 mg total) by mouth daily.  . [DISCONTINUED] losartan-hydrochlorothiazide (HYZAAR) 100-12.5 MG tablet Take 1 tablet by mouth daily.     Allergies:   Codeine   Social History   Socioeconomic History  . Marital status: Married    Spouse name: None  . Number of children: None  . Years of education: None  . Highest education level: None  Social Needs  . Financial resource strain: None  . Food insecurity - worry: None  . Food insecurity - inability: None  . Transportation needs - medical: None  . Transportation needs - non-medical: None  Occupational History  . None  Tobacco Use  . Smoking status: Never Smoker  . Smokeless tobacco: Never Used  Substance and Sexual Activity  . Alcohol use: Yes    Comment: socially  . Drug use: No  . Sexual activity: None  Other Topics Concern  . None  Social History Narrative  . None     Family History: The patient's family history includes CAD in his brother and mother; Emphysema in his mother; Heart failure in his mother. ROS:   Please see the history of present illness.     All other systems reviewed and are negative.  EKGs/Labs/Other Studies Reviewed:    The following studies were reviewed today:  Echocardiogram 02/13/2017 Study Conclusions - Left ventricle: The cavity size was normal. Wall thickness was   normal. Systolic function was normal. The estimated ejection   fraction was in the range of 60% to 65%. Wall motion was normal;   there were no regional wall motion abnormalities. Doppler   parameters are consistent with abnormal left ventricular   relaxation (grade 1 diastolic dysfunction). - Aortic valve: Trileaflet; moderately calcified leaflets. There   was  mild stenosis. There was trivial regurgitation. Mean gradient   (S): 14 mm Hg. Valve area (VTI): 1.77 cm^2. - Ascending aorta: The ascending aorta was mildly dilated at 3.8   cm. - Mitral valve: There was trivial regurgitation. - Right ventricle: The cavity size was normal. Systolic function   was normal. - Tricuspid valve: Peak RV-RA gradient (S): 29 mm Hg. - Pulmonary arteries: PA peak pressure: 32 mm Hg (S). - Inferior vena cava: The vessel was normal in size. The   respirophasic diameter changes were in the normal range (= 50%),   consistent with normal central venous pressure.  Impressions: - Normal LV size with EF 60-65%. Normal RV size and systolic   function. Mild aortic stenosis.  EKG:  EKG is not  ordered today.    Recent Labs: 02/16/2017: BUN 22; Creatinine, Ser 1.00; Potassium 4.5; Sodium 140 08/22/2017: ALT 45   Recent Lipid Panel    Component Value Date/Time   CHOL 201 (H) 04/09/2017 0818   TRIG 384 (H) 04/09/2017 0818   HDL 35 (L) 04/09/2017 0818   CHOLHDL 5.7 (H) 04/09/2017 0818   CHOLHDL 2.4 03/10/2016 0747   VLDL 34 (H) 03/10/2016 0747   LDLCALC 89 04/09/2017 0818   LDLDIRECT 54.0 08/18/2015 0733    Physical Exam:    VS:  BP 138/84   Pulse 63   Ht 5\' 10"  (1.778 m)   Wt 236 lb (107 kg)   SpO2 96%   BMI 33.86 kg/m     Wt Readings from Last 3 Encounters:  09/26/17 236 lb (107 kg)  03/02/17 281 lb 9.6 oz (127.7 kg)  02/16/17 242 lb (109.8 kg)     Physical Exam  Constitutional: He is oriented to person, place, and time. He appears well-developed and well-nourished. No distress.  HENT:  Head: Normocephalic and atraumatic.  Neck: Normal range of motion. Neck supple. No JVD present.  Cardiovascular: Normal rate, regular rhythm and normal heart sounds. Exam reveals no gallop.  No murmur heard. Pulmonary/Chest: Effort normal and breath sounds normal. No respiratory distress. He has no wheezes. He has no rales.  Abdominal: Soft. Bowel sounds are  normal.  Musculoskeletal: Normal range of motion.  Trace pretibial edema  Neurological: He is alert and oriented to person, place, and time.  Skin: Skin is warm and dry.  Psychiatric: He has a normal mood and affect. His behavior is normal. Thought content normal.     ASSESSMENT:    1. Atherosclerosis of native coronary artery of native heart without angina pectoris   2. Pure hypercholesterolemia   3. Elevated blood sugar   4. Mild aortic stenosis   5. Essential hypertension, benign   6. Tremor    PLAN:    In order of problems listed above:  1. CAD: S/P PCI to the LAD and residual RCA disease of 50-60% treated medically, Pt is asymptomatic and is exercising and feels better than he has in a long time. He continues on BB and ARB. Discussion of statin below.   2. Hyperlipidemia: The patient was on Crestor 40 mg and has mildly elevated LFTs, no more than 3X his previous. Crestor was stopped and LFTs improved. He was previously on PCSK9-I but has stopped taking it because when he turned 65 and went on Medicare the cost jumped from $5 to $140. We had a discussion and he wants to give himself the best possible outcome so he will consider trying to budget the cost of the [place]. I am going to restart the Crestor at 20 mg for now and have the pt seen in the lipid clinic for optimization.  LDL very was well controlled on the dual mediation regimen. LDL up to 89 with these meds stopped.  The patient is trying to improve his diet, cutting down on carbs and restaurant foods and increasing beans and vegetables.   3. Hypertension: The patient reports that his BP has been elevated when he checks it. Today it was 142/94 upon arrival and 138/84 after rest. He has trace pretibial edema. Will increase the HCTZ with his ARB. Continue amlodipine.  4. Elevated blood sugar: the patient states that he has been told that he could have diabetes but has not been told that he actually does. His blood sugars have  been elevated on his metabolic panels. He does not think that he has had an A1c checked and there is not one in the computer. We will check one today for risk stratification and he if elevated he will go to his PCP for evaluation and treatment.   5. AS: mild by echo. Mean gradient 14 mm Hg. Following yearly, echo scheduled for 01/2017.  6. Dilated Ascending Aorta: mild, measuring at 3.8 cm. Repeat echo in 1 year.   7. Resting Tremor: Controlled with propranolol.    Medication Adjustments/Labs and Tests Ordered: Current medicines are reviewed at length with the patient today.  Concerns regarding medicines are outlined above. Labs and tests ordered and medication changes are outlined in the patient instructions below:  Patient Instructions  Medication Instructions: Your physician has recommended you make the following change in your medication:  -1) INCREASE Losartan Hydrochlorothiazide (Hyzaar) 100-25 mg - Take 1 tablet by mouth daily -2) RESTART Crestor 20 mg - Take 0.5 tablet (20 mg) by mouth daily - If you need a new RX call and we will send it  Labwork: Your physician recommends that you return for lab work in 1 MONTHS : FASTING Lipid and Hepatic Function Test  \Your physician recommends that you have lab work today: HgB A1C  Procedures/Testing: None Ordered  Follow-Up: You have been referred to [place] physician wants you to follow-up in: 6 MONTHS with Dr. Radford Pax. You will receive a reminder letter in the mail two months in advance. If you don't receive a letter, please call our office to schedule the follow-up appointment.   If you need a refill on your cardiac medications before your next appointment, please call your pharmacy.      Signed, Daune Perch, NP  09/27/2017 8:51 AM    [place]

## 2017-09-27 NOTE — Telephone Encounter (Signed)
F/u Message  Nicki the pharmacist at [facility] call to f/u for telephone call on 08/20/17 to see if pt complete lab work. Please call back to discuss

## 2017-10-01 DIAGNOSIS — M545 Low back pain: Secondary | ICD-10-CM | POA: Diagnosis not present

## 2017-10-02 ENCOUNTER — Telehealth: Payer: Self-pay | Admitting: Cardiology

## 2017-10-17 NOTE — Progress Notes (Signed)
Patient ID: EARLY STEEL                 DOB: 1951-05-18                    MRN: [id number]     HPI: [name] [name] is a 66 y.o. male patient of Dr. Radford Pax referred to lipid clinic by Daune Perch, PA-C. PMH is significant for CAD s/p PCI to LAD with residual 50-60% RCA dx, mild aortic stenosis, HTN, and dyslipidemia. Pt was previously on Praluent but stopped taking it this past year when he went on Medicare and copay increased from $5 to $140/month. Of note, pt has tolerated statin therapy and was on rosuvastatin but this was stopped with an elevation in liver enzymes 03/2017. However, given inability to afford Praluent pt was restarted at lower dose of rosuvastatin at last OV on 09/26/17.  Pt presents in good spirits. He reports he stopped taking Praluent in August or September 2017. Pt has never stopped taking the Zetia but Crestor was stopped 03/2017 as noted above. Pt endorses that he does get leg myalgias with Crestor and walking can become difficult, but he is willing to continue therapy and increase the dose. He states he would be willing to pursue PCSK9-inhibitor therapy again in the future, but is planning to change insurance coverage to [facility] in January and would like to wait. Pt reports he was recently diagnosed with diabetes and was started on metformin. He recently started back at the gym, but reports he could do better with his diet.  Current Medications: rosuvastatin 20mg  daily, Zetia 10mg  daily Intolerances: atorvastatin 80mg  QHS (myalgias), Praluent 75 mg q2weeks (cost-prohibitive) Risk Factors: CAD s/p PCI, HTN, dyslipidemia, Family history of CAD LDL goal: <70 mg/dl  Diet: Breakfast - oatmeal or egg sandwich with black coffee, Lunch - fast food, pizza, or Hispanic food, Dinner - home cooked food but variable, lots of "meat and potatoes". Pt avoids sweets and snacking. Pt primarily drinks water.  Exercise: Started back at gym 3 weeks ago and plans to go 3x/week  Family History:  CAD (mother, father)  Social History: Never smoker, social alcohol use  Labs: Lipid Panel (04/09/17): TC 201, TG 384, HDL 35, LDL 89 (rosuvastatin 40mg , Zetia 10mg )  Past Medical History:  Diagnosis Date  . Coronary artery disease    s/p PCI of LAD w Residual 50-60% RCA  . High cholesterol   . History of echocardiogram 06/2011   Normal LVF,mild LVH,trivial TR/MR, mild AS/AI  . History of kidney stones   . Hypertension   . Mild aortic stenosis    mean AV gradient 59mmHg by echo 02/2016    Current Outpatient Medications on File Prior to Visit  Medication Sig Dispense Refill  . allopurinol (ZYLOPRIM) 300 MG tablet Take 1 tablet (300 mg total) by mouth daily. 30 tablet 6  . amLODipine (NORVASC) 10 MG tablet Take 1 tablet (10 mg total) by mouth daily. 30 tablet 11  . aspirin 81 MG tablet Take 81 mg by mouth daily.    Marland Kitchen buPROPion (WELLBUTRIN XL) 300 MG 24 hr tablet Take 300 mg by mouth daily.    Marland Kitchen ezetimibe (ZETIA) 10 MG tablet TAKE ONE TABLET BY MOUTH EVERY DAY. MUSTKEEP APPOINTMENT FOR 02/16/17 FOR FURTHERREFILLS. 30 tablet 9  . indomethacin (INDOCIN) 50 MG capsule Take 50 mg as needed by mouth (w/food - take as directed).     Marland Kitchen losartan-hydrochlorothiazide (HYZAAR) 100-25 MG tablet Take 1 tablet  daily by mouth. 30 tablet 8  . metFORMIN (GLUCOPHAGE) 500 MG tablet Take 500 mg daily by mouth.    . nitroGLYCERIN (NITROSTAT) 0.4 MG SL tablet Place 1 tablet (0.4 mg total) under the tongue every 5 (five) minutes as needed for chest pain. 25 tablet 0  . propranolol ER (INDERAL LA) 80 MG 24 hr capsule Take 2 capsules (160 mg total) by mouth daily. 180 capsule 3  . rosuvastatin (CRESTOR) 20 MG tablet Take 1 tablet (20 mg total) by mouth daily. (Patient not taking: Reported on 09/26/2017) 30 tablet 11   No current facility-administered medications on file prior to visit.     Allergies  Allergen Reactions  . Codeine Other (See Comments)    Vertigo and upset stomach    Assessment/Plan:  1.  Hyperlipidemia - LDL above goal 70mg /dl on most recent check in 03/2017, no recent lipid panel has been collected. Will empirically optimize rosuvastatin to 40mg  daily and continue Zetia 10mg  daily given history of ASCVD. Will obtain hepatic function test today and plan for a repeat lipid panel in 2 months after patient has been on optimized statin therapy. Will re-evaluate need for PCSK9-inhibitor therapy and insurance coverage once F/U lipid panel has been evaluated. Counseled pt extensively on lifestyle modifications to help with both lipid and diabetes control.  Arrie Senate, PharmD, BCPS PGY-2 Cardiology Pharmacy Resident Pager: [phone] 10/18/2017

## 2017-10-18 ENCOUNTER — Ambulatory Visit (INDEPENDENT_AMBULATORY_CARE_PROVIDER_SITE_OTHER): Payer: Medicare Other | Admitting: Pharmacist

## 2017-10-18 DIAGNOSIS — I2510 Atherosclerotic heart disease of native coronary artery without angina pectoris: Secondary | ICD-10-CM | POA: Diagnosis not present

## 2017-10-18 LAB — HEPATIC FUNCTION PANEL
ALBUMIN: 4.5 g/dL (ref 3.6–4.8)
ALK PHOS: 56 IU/L (ref 39–117)
ALT: 40 IU/L (ref 0–44)
AST: 35 IU/L (ref 0–40)
Bilirubin Total: 0.6 mg/dL (ref 0.0–1.2)
Bilirubin, Direct: 0.17 mg/dL (ref 0.00–0.40)
Total Protein: 6.7 g/dL (ref 6.0–8.5)

## 2017-10-18 NOTE — Patient Instructions (Signed)
It was great to meet you.  Increase Crestor to 40mg  daily. Continue taking your Zetia 10mg  daily.   Return for a cholesterol check in 2 months, and be sure not to eat breakfast before coming in.

## 2017-10-26 ENCOUNTER — Other Ambulatory Visit: Payer: Medicare Other

## 2017-11-24 ENCOUNTER — Emergency Department (HOSPITAL_COMMUNITY): Payer: Medicare HMO

## 2017-11-24 ENCOUNTER — Emergency Department (HOSPITAL_COMMUNITY)
Admission: EM | Admit: 2017-11-24 | Discharge: 2017-11-24 | Disposition: A | Discharge status: Home / Self Care | Payer: Medicare HMO | Attending: Emergency Medicine | Admitting: Emergency Medicine

## 2017-11-24 ENCOUNTER — Encounter (HOSPITAL_COMMUNITY): Payer: Self-pay | Admitting: *Deleted

## 2017-11-24 ENCOUNTER — Other Ambulatory Visit: Payer: Self-pay

## 2017-11-24 DIAGNOSIS — W19XXXA Unspecified fall, initial encounter: Secondary | ICD-10-CM | POA: Insufficient documentation

## 2017-11-24 DIAGNOSIS — Y998 Other external cause status: Secondary | ICD-10-CM | POA: Diagnosis not present

## 2017-11-24 DIAGNOSIS — Z7984 Long term (current) use of oral hypoglycemic drugs: Secondary | ICD-10-CM | POA: Insufficient documentation

## 2017-11-24 DIAGNOSIS — Z79899 Other long term (current) drug therapy: Secondary | ICD-10-CM | POA: Insufficient documentation

## 2017-11-24 DIAGNOSIS — I2510 Atherosclerotic heart disease of native coronary artery without angina pectoris: Secondary | ICD-10-CM | POA: Insufficient documentation

## 2017-11-24 DIAGNOSIS — Z7982 Long term (current) use of aspirin: Secondary | ICD-10-CM | POA: Insufficient documentation

## 2017-11-24 DIAGNOSIS — Y9389 Activity, other specified: Secondary | ICD-10-CM | POA: Insufficient documentation

## 2017-11-24 DIAGNOSIS — R55 Syncope and collapse: Secondary | ICD-10-CM | POA: Insufficient documentation

## 2017-11-24 DIAGNOSIS — I10 Essential (primary) hypertension: Secondary | ICD-10-CM | POA: Insufficient documentation

## 2017-11-24 DIAGNOSIS — R42 Dizziness and giddiness: Secondary | ICD-10-CM | POA: Diagnosis not present

## 2017-11-24 DIAGNOSIS — Y929 Unspecified place or not applicable: Secondary | ICD-10-CM | POA: Diagnosis not present

## 2017-11-24 LAB — CBC
HCT: 50.2 % (ref 39.0–52.0)
HEMOGLOBIN: 17.8 g/dL — AB (ref 13.0–17.0)
MCH: 33.5 pg (ref 26.0–34.0)
MCHC: 35.5 g/dL (ref 30.0–36.0)
MCV: 94.5 fL (ref 78.0–100.0)
Platelets: 139 10*3/uL — ABNORMAL LOW (ref 150–400)
RBC: 5.31 MIL/uL (ref 4.22–5.81)
RDW: 13.4 % (ref 11.5–15.5)
WBC: 7.1 10*3/uL (ref 4.0–10.5)

## 2017-11-24 LAB — COMPREHENSIVE METABOLIC PANEL
ALBUMIN: 4.3 g/dL (ref 3.5–5.0)
ALK PHOS: 58 U/L (ref 38–126)
ALT: 41 U/L (ref 17–63)
AST: 42 U/L — AB (ref 15–41)
Anion gap: 11 (ref 5–15)
BILIRUBIN TOTAL: 0.8 mg/dL (ref 0.3–1.2)
BUN: 13 mg/dL (ref 6–20)
CALCIUM: 10 mg/dL (ref 8.9–10.3)
CO2: 24 mmol/L (ref 22–32)
Chloride: 102 mmol/L (ref 101–111)
Creatinine, Ser: 1.11 mg/dL (ref 0.61–1.24)
GFR calc Af Amer: >60 mL/min (ref >60)
GFR calc non Af Amer: >60 mL/min (ref >60)
GLUCOSE: 199 mg/dL — AB (ref 65–99)
POTASSIUM: 3.7 mmol/L (ref 3.5–5.1)
Sodium: 137 mmol/L (ref 135–145)
TOTAL PROTEIN: 7.1 g/dL (ref 6.5–8.1)

## 2017-11-24 LAB — APTT: APTT: 27 s (ref 24–36)

## 2017-11-24 LAB — CBG MONITORING, ED: Glucose-Capillary: 157 mg/dL — ABNORMAL HIGH (ref 65–99)

## 2017-11-24 LAB — I-STAT TROPONIN, ED: Troponin i, poc: 0 ng/mL (ref 0.00–0.08)

## 2017-11-24 LAB — DIFFERENTIAL
BASOS ABS: 0 10*3/uL (ref 0.0–0.1)
Basophils Relative: 1 %
Eosinophils Absolute: 0.3 10*3/uL (ref 0.0–0.7)
Eosinophils Relative: 4 %
LYMPHS ABS: 2.3 10*3/uL (ref 0.7–4.0)
LYMPHS PCT: 33 %
Monocytes Absolute: 0.4 10*3/uL (ref 0.1–1.0)
Monocytes Relative: 6 %
NEUTROS PCT: 56 %
Neutro Abs: 4.1 10*3/uL (ref 1.7–7.7)

## 2017-11-24 LAB — PROTIME-INR
INR: 1.07
Prothrombin Time: 13.8 seconds (ref 11.4–15.2)

## 2017-11-24 NOTE — ED Provider Notes (Signed)
[facility]ENC[facility] DEPARTMENT Provider Note   CSN: [id number] Arrival date & time: 11/24/17  1247     History   Chief Complaint Chief Complaint  Patient presents with  . Dizziness    HPI [name] [name] is a 67 y.o. male w PMHx CAD, HLD, HTN, presenting to ED with intermittent episodes of dizziness and imbalance. Pt states symptoms began on Wednesday, when he suddenly fell hitting his face on the coffee table.  He states it was not a mechanical fall, however states he is unsure of what happened, "the floor just fell out from under me."  Denies LOC.  He states he had no issues following that fall on Wednesday or Thursday, however Friday morning began feeling very dizzy and off-balance.  Describes dizziness as both room spinning and lightheaded.  He states the symptoms have been intermittent since yesterday, sometimes worse with movement, and sometimes not.  He denies headache, vision changes, nausea, unilateral weakness or numbness, facial droop, slurred speech, chest pain, shortness of breath, unilateral leg swelling/pain.  Denies history of CVA.  Not on anticoagulation.  No history of vertigo.  The history is provided by the patient.    Past Medical History:  Diagnosis Date  . Coronary artery disease    s/p PCI of LAD w Residual 50-60% RCA  . High cholesterol   . History of echocardiogram 06/2011   Normal LVF,mild LVH,trivial TR/MR, mild AS/AI  . History of kidney stones   . Hypertension   . Mild aortic stenosis    mean AV gradient 33mmHg by echo 02/2016    Patient Active Problem List   Diagnosis Date Noted  . Tremor 09/27/2017  . Ascending aorta dilatation (HCC) 02/16/2017  . Mild aortic stenosis   . Coronary atherosclerosis of native coronary artery 12/30/2013  . Essential hypertension, benign 12/30/2013  . Pure hypercholesterolemia 12/30/2013  . Encounter for long-term (current) use of other medications 12/30/2013    History reviewed. No pertinent  surgical history.     Home Medications    Prior to Admission medications   Medication Sig Start Date End Date Taking? Authorizing Provider  allopurinol (ZYLOPRIM) 300 MG tablet Take 1 tablet (300 mg total) by mouth daily. 03/10/15  Yes Turner, Eber Hong, MD  amLODipine (NORVASC) 10 MG tablet Take 1 tablet (10 mg total) by mouth daily. 02/28/17  Yes Sueanne Margarita, MD  aspirin 81 MG tablet Take 81 mg by mouth daily.   Yes [provider]  buPROPion (WELLBUTRIN XL) 300 MG 24 hr tablet Take 300 mg by mouth daily.   Yes [provider]  ezetimibe (ZETIA) 10 MG tablet TAKE ONE TABLET BY MOUTH EVERY DAY. MUSTKEEP APPOINTMENT FOR 02/16/17 FOR FURTHERREFILLS. 06/07/17  Yes Turner, Eber Hong, MD  indomethacin (INDOCIN) 50 MG capsule Take 50 mg as needed by mouth (w/food - take as directed).    Yes [provider]  losartan-hydrochlorothiazide (HYZAAR) 100-25 MG tablet Take 1 tablet daily by mouth. 09/26/17  Yes Daune Perch, NP  metFORMIN (GLUCOPHAGE) 500 MG tablet Take 500 mg daily by mouth. 09/14/17  Yes [provider]  nitroGLYCERIN (NITROSTAT) 0.4 MG SL tablet Place 1 tablet (0.4 mg total) under the tongue every 5 (five) minutes as needed for chest pain. 05/09/17  Yes Turner, Eber Hong, MD  propranolol ER (INDERAL LA) 80 MG 24 hr capsule Take 2 capsules (160 mg total) by mouth daily. 02/16/17  Yes Lyda Jester M, PA-C  rosuvastatin (CRESTOR) 20 MG tablet  Take 1 tablet (20 mg total) by mouth daily. Patient taking differently: Take 40 mg by mouth daily.  02/27/17  Yes Consuelo Pandy, PA-C    Family History Family History  Problem Relation Age of Onset  . CAD Brother   . Heart failure Mother   . Emphysema Mother   . CAD Mother     Social History Social History   Tobacco Use  . Smoking status: Never Smoker  . Smokeless tobacco: Never Used  Substance Use Topics  . Alcohol use: Yes    Comment: socially  . Drug use: No     Allergies    Codeine   Review of Systems Review of Systems  Constitutional: Negative for fever.  Eyes: Negative for visual disturbance.  Respiratory: Negative for shortness of breath.   Cardiovascular: Negative for chest pain and leg swelling.  Neurological: Positive for dizziness and light-headedness. Negative for syncope, facial asymmetry, speech difficulty, weakness, numbness and headaches.  Hematological: Does not bruise/bleed easily.  Psychiatric/Behavioral: Negative for confusion.  All other systems reviewed and are negative.    Physical Exam Updated Vital Signs BP 139/85   Pulse (!) 57   Temp 98.7 F (37.1 C) (Oral)   Resp (!) 21   SpO2 94%   Physical Exam  Constitutional: He appears well-developed and well-nourished. No distress.  HENT:  Head: Normocephalic and atraumatic.  Mouth/Throat: Oropharynx is clear and moist.  Eyes: Conjunctivae and EOM are normal. Pupils are equal, round, and reactive to light.  Neck: Normal range of motion. Neck supple.  Cardiovascular: Normal rate, regular rhythm, normal heart sounds and intact distal pulses.  Pulmonary/Chest: Effort normal and breath sounds normal. No respiratory distress.  Abdominal: Soft. Bowel sounds are normal. He exhibits no distension. There is no tenderness.  Musculoskeletal:  Bilateral LE edema (chronic), no erythema or tenderness.  Neurological: He is alert.  Mental Status:  Alert, oriented, thought content appropriate, able to give a coherent history. Speech fluent without evidence of aphasia. Able to follow 2 step commands without difficulty.  Cranial Nerves:  II:  Peripheral visual fields grossly normal, pupils equal, round, reactive to light III,IV, VI: ptosis not present, extra-ocular motions intact bilaterally  V,VII: smile symmetric, facial light touch sensation equal VIII: hearing grossly normal to voice  X: uvula elevates symmetrically  XI: bilateral shoulder shrug symmetric and strong XII: midline tongue  extension without fassiculations Motor:  Normal tone. 5/5 in upper and lower extremities bilaterally including strong and equal grip strength and dorsiflexion/plantar flexion Sensory: Pinprick and light touch normal in all extremities.  Deep Tendon Reflexes: 2+ and symmetric in the biceps and patella Cerebellar: normal finger-to-nose with bilateral upper extremities Gait: normal gait and balance CV: distal pulses palpable throughout    Skin: Skin is warm.  Psychiatric: He has a normal mood and affect. His behavior is normal.  Nursing note and vitals reviewed.    ED Treatments / Results  Labs (all labs ordered are listed, but only abnormal results are displayed) Labs Reviewed  CBC - Abnormal; Notable for the following components:      Result Value   Hemoglobin 17.8 (*)    Platelets 139 (*)    All other components within normal limits  COMPREHENSIVE METABOLIC PANEL - Abnormal; Notable for the following components:   Glucose, Bld 199 (*)    AST 42 (*)    All other components within normal limits  CBG MONITORING, ED - Abnormal; Notable for the following components:   Glucose-Capillary 157 (*)  All other components within normal limits  PROTIME-INR  APTT  DIFFERENTIAL  I-STAT TROPONIN, ED    EKG  EKG Interpretation  Date/Time:  Saturday November 24 2017 13:00:27 EST Ventricular Rate:  61 PR Interval:  180 QRS Duration: 98 QT Interval:  448 QTC Calculation: 450 R Axis:   -11 Text Interpretation:  Normal sinus rhythm Minimal voltage criteria for LVH, may be normal variant Nonspecific T wave abnormality Abnormal ECG Interpretation limited secondary to artifact Confirmed by Theotis Burrow [id number]) on 11/24/2017 5:54:37 PM       Radiology Ct Head Wo Contrast  Result Date: 11/24/2017 CLINICAL DATA:  67 year old male with headache and dizziness for 4 days. EXAM: CT HEAD WITHOUT CONTRAST TECHNIQUE: Contiguous axial images were obtained from the base of the skull through the  vertex without intravenous contrast. COMPARISON:  None. FINDINGS: Brain: No evidence of acute infarction, hemorrhage, hydrocephalus, extra-axial collection or mass lesion/mass effect. Mild periventricular hypodensities likely represent chronic small-vessel white matter ischemic changes. Vascular: Intracranial atherosclerotic calcifications noted. Skull: Normal. Negative for fracture or focal lesion. Sinuses/Orbits: No acute finding. Other: None IMPRESSION: No evidence of acute intracranial abnormality. Mild probable chronic small-vessel white matter ischemic changes. Electronically Signed   By: Margarette Canada M.D.   On: 11/24/2017 14:08    Procedures Procedures (including critical care time)  Medications Ordered in ED Medications - No data to display   Initial Impression / Assessment and Plan / ED Course  I have reviewed the triage vital signs and the nursing notes.  Pertinent labs & imaging results that were available during my care of the patient were reviewed by me and considered in my medical decision making (see chart for details).     Pt presenting with episodic dizziness and imbalance, without nausea, HA, vision changes, slurred speech or facial droop. Pt with symptoms intermittently since Wednesday, asymptomatic Thursday, and intermittently yesterday and today. No hx CVA, however with risk factors. Normal neuro exam, asymptomatic in ED. Unable to reproduce sx with position changes. CT head done in triage is negative. Labs reassuring, including neg trop. Pt discussed with and seen by Dr. Rex Kras, who recommended CTA head and neck, however pt stating he needed to leave to get his wife home. It was discussed that this imaging is recommended in ED today, however pt reporting he needs to leave and will promptly follow up w PCP. Doubt acute CVA given intermittent symptoms. Pt will be discharged with strict instructions to follow up with PCP for CTA head and neck. Also discussed strict return  precautions to ED.   Discussed results, findings, treatment and follow up. Patient advised of return precautions. Patient verbalized understanding and agreed with plan.  Final Clinical Impressions(s) / ED Diagnoses   Final diagnoses:  Dizziness    ED Discharge Orders    None       Robinson, [place] N, PA-C 11/24/17 2003    Rex Kras Wenda Overland, MD 11/26/17 2119

## 2017-11-24 NOTE — ED Triage Notes (Signed)
Pt reports falling on wed, having dizziness since wed. Went to [facility] today and sent here to r/o cva. Pt denies unilateral weakness. Dizziness increases with sudden movement and turning of his head.

## 2017-11-24 NOTE — Discharge Instructions (Signed)
Please read instructions below. It is important that you schedule an appointment to follow up with your primary care provider regarding your visit today. It is highly recommended that you get imaging of the blood vessels in your head and neck. Return to the ER immediately for vision changes, severe sudden headache, worsening/persistent dizziness, facial droop, slurred speech, weakness on one side of your body, or new or concerning symptoms.

## 2017-11-25 ENCOUNTER — Encounter: Payer: Self-pay | Admitting: Cardiology

## 2017-11-26 DIAGNOSIS — R42 Dizziness and giddiness: Secondary | ICD-10-CM | POA: Diagnosis not present

## 2017-11-26 NOTE — Telephone Encounter (Signed)
Entered in error

## 2017-12-11 DIAGNOSIS — E7800 Pure hypercholesterolemia, unspecified: Secondary | ICD-10-CM | POA: Diagnosis not present

## 2017-12-11 DIAGNOSIS — Z0000 Encounter for general adult medical examination without abnormal findings: Secondary | ICD-10-CM | POA: Diagnosis not present

## 2017-12-11 DIAGNOSIS — E119 Type 2 diabetes mellitus without complications: Secondary | ICD-10-CM | POA: Diagnosis not present

## 2017-12-11 DIAGNOSIS — Z23 Encounter for immunization: Secondary | ICD-10-CM | POA: Diagnosis not present

## 2017-12-11 DIAGNOSIS — Z125 Encounter for screening for malignant neoplasm of prostate: Secondary | ICD-10-CM | POA: Diagnosis not present

## 2017-12-11 DIAGNOSIS — E291 Testicular hypofunction: Secondary | ICD-10-CM | POA: Diagnosis not present

## 2017-12-11 DIAGNOSIS — M109 Gout, unspecified: Secondary | ICD-10-CM | POA: Diagnosis not present

## 2017-12-11 DIAGNOSIS — I10 Essential (primary) hypertension: Secondary | ICD-10-CM | POA: Diagnosis not present

## 2017-12-11 DIAGNOSIS — Z79899 Other long term (current) drug therapy: Secondary | ICD-10-CM | POA: Diagnosis not present

## 2017-12-20 ENCOUNTER — Other Ambulatory Visit: Payer: Medicare HMO | Admitting: *Deleted

## 2017-12-20 DIAGNOSIS — E7800 Pure hypercholesterolemia, unspecified: Secondary | ICD-10-CM

## 2017-12-20 DIAGNOSIS — I2510 Atherosclerotic heart disease of native coronary artery without angina pectoris: Secondary | ICD-10-CM | POA: Diagnosis not present

## 2017-12-20 LAB — LIPID PANEL
Chol/HDL Ratio: 4 ratio (ref 0.0–5.0)
Cholesterol, Total: 133 mg/dL (ref 100–199)
HDL: 33 mg/dL — ABNORMAL LOW (ref >39)
LDL Calculated: 55 mg/dL (ref 0–99)
Triglycerides: 226 mg/dL — ABNORMAL HIGH (ref 0–149)
VLDL CHOLESTEROL CAL: 45 mg/dL — AB (ref 5–40)

## 2017-12-20 LAB — HEPATIC FUNCTION PANEL
ALT: 33 IU/L (ref 0–44)
AST: 30 IU/L (ref 0–40)
Albumin: 4.5 g/dL (ref 3.6–4.8)
Alkaline Phosphatase: 56 IU/L (ref 39–117)
BILIRUBIN, DIRECT: 0.12 mg/dL (ref 0.00–0.40)
Bilirubin Total: 0.4 mg/dL (ref 0.0–1.2)
Total Protein: 6.6 g/dL (ref 6.0–8.5)

## 2018-02-15 ENCOUNTER — Other Ambulatory Visit: Payer: Self-pay

## 2018-02-15 ENCOUNTER — Telehealth: Payer: Self-pay

## 2018-02-15 ENCOUNTER — Ambulatory Visit (HOSPITAL_COMMUNITY): Payer: Medicare HMO | Attending: Cardiovascular Disease

## 2018-02-15 ENCOUNTER — Encounter: Payer: Self-pay | Admitting: Cardiology

## 2018-02-15 DIAGNOSIS — I350 Nonrheumatic aortic (valve) stenosis: Secondary | ICD-10-CM

## 2018-02-15 DIAGNOSIS — I77810 Thoracic aortic ectasia: Secondary | ICD-10-CM

## 2018-02-15 DIAGNOSIS — I088 Other rheumatic multiple valve diseases: Secondary | ICD-10-CM | POA: Insufficient documentation

## 2018-02-15 NOTE — Telephone Encounter (Signed)
Notes recorded by Teressa Senter, RN on 02/15/2018 at 2:09 PM EDT Patient made of echo results. Patient verbalized understanding and thankful for the call. Echo ordered to be scheduled in a year.   Notes recorded by Sueanne Margarita, MD on 02/15/2018 at 12:59 PM EDT Echo showed normal LVF with increased stiffenss of heart muscle, mildly dilated aorta, mild AS - repeat echo in 1 year for dilated aorta

## 2018-03-16 IMAGING — CT CT HEAD W/O CM
4 series · 17 of 47 positions shown, 19 images · non-contrast
Comparison: None.

CLINICAL DATA: 66-year-old male with headache and dizziness for 4
days.

EXAM:
CT HEAD WITHOUT CONTRAST
TECHNIQUE: Contiguous axial images were obtained from the base of the skull
through the vertex without intravenous contrast.

[Series 3: head without · axial · non-contrast · 0.53mm/px · z∈[-230,-95]mm · 7 of 37 slices shown, 9 images]
[im 5/37  brain]
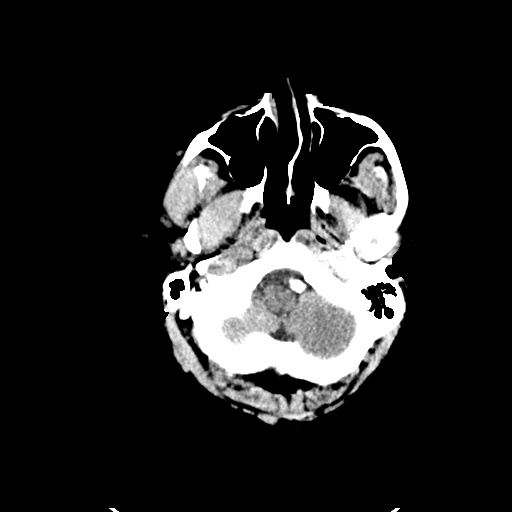
[im 5/37  bone]
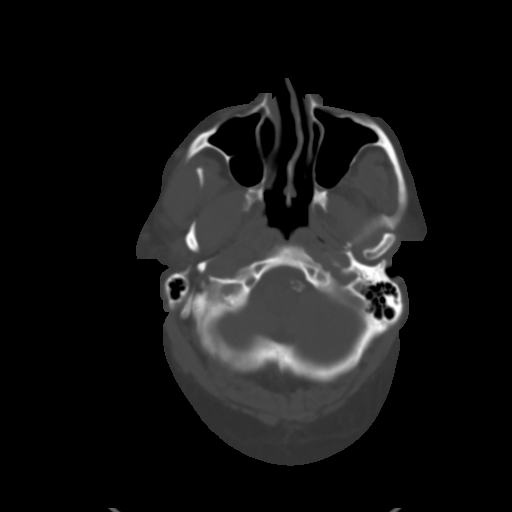
[im 10/37  brain]
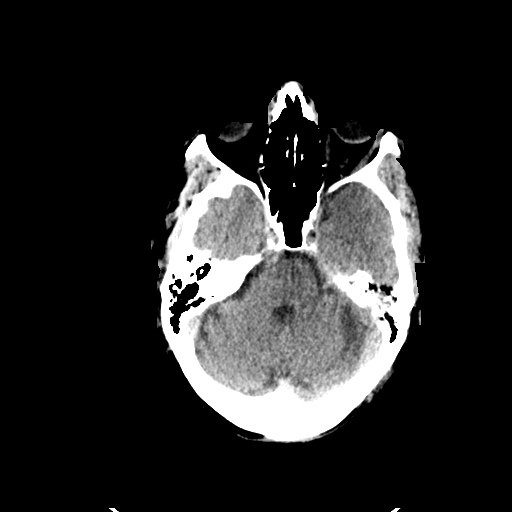
[im 14/37  brain]
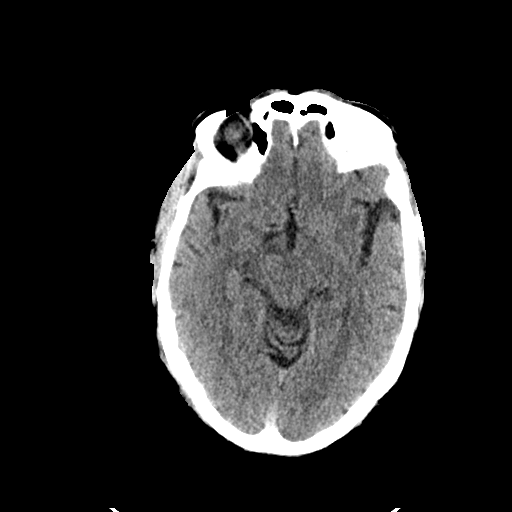
[im 19/37  brain]
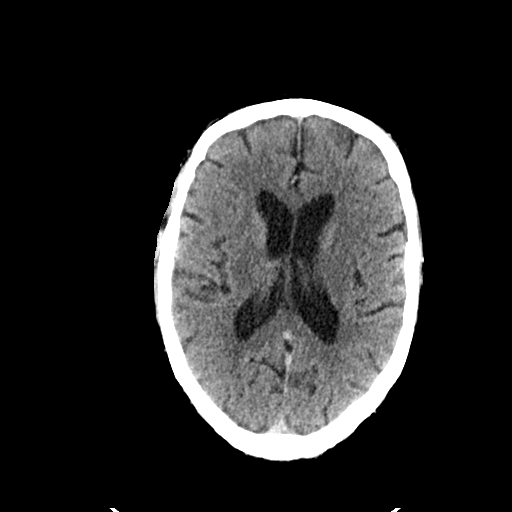
[im 23/37  brain]
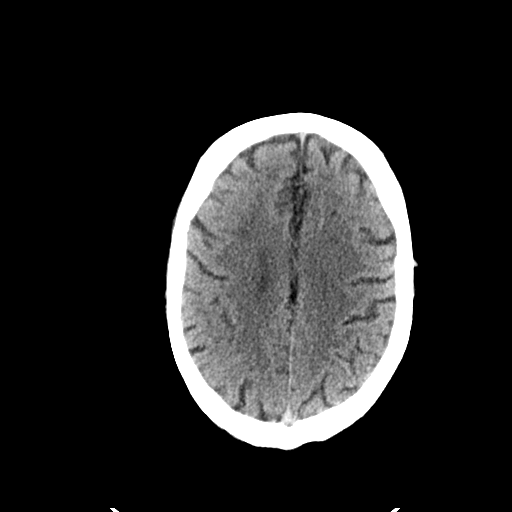
[im 23/37  bone]
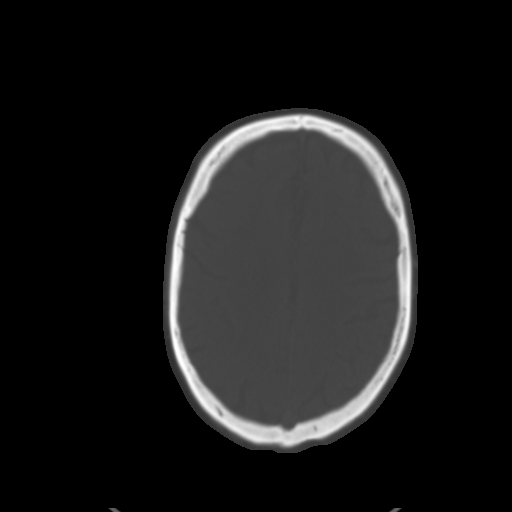
[im 28/37  brain]
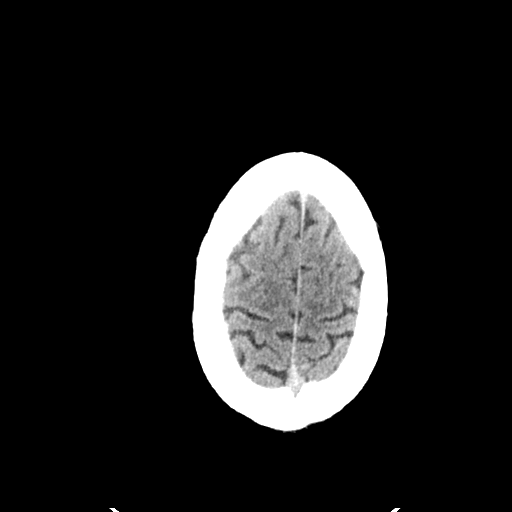
[im 32/37  brain]
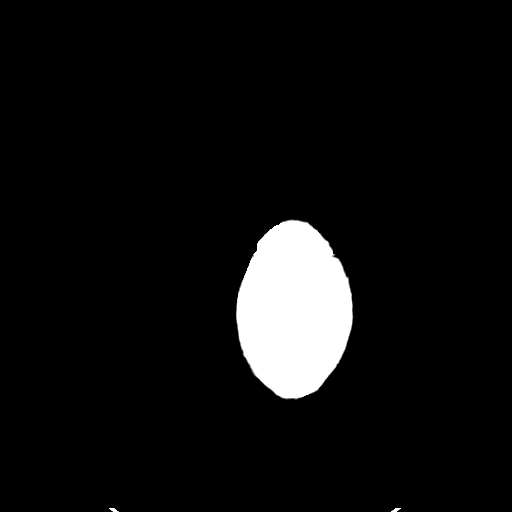

[Series 4: head bone · axial · 0.53mm/px · z∈[-232,-170]mm · 4 of 92 slices shown]
[im 10/92  bone]
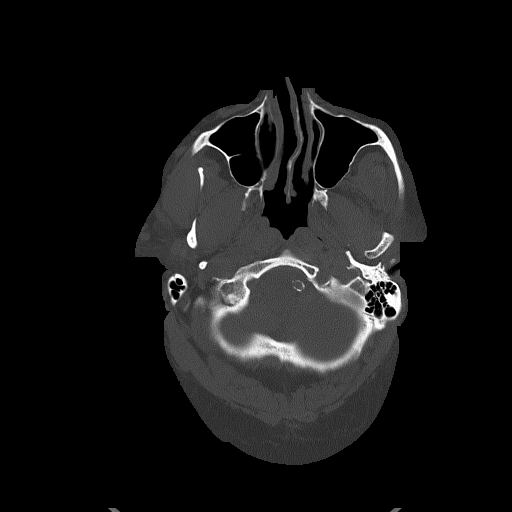
[im 19/92  bone]
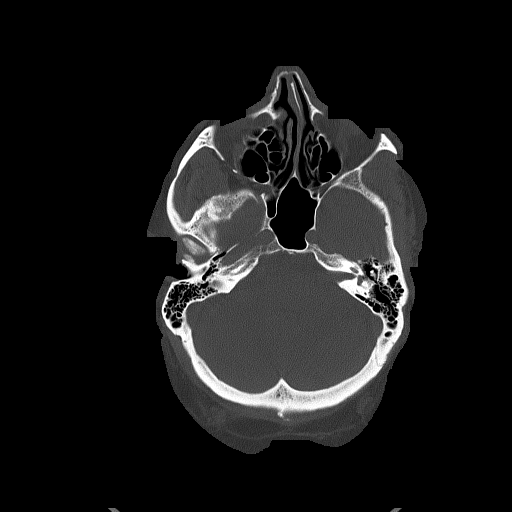
[im 28/92  bone]
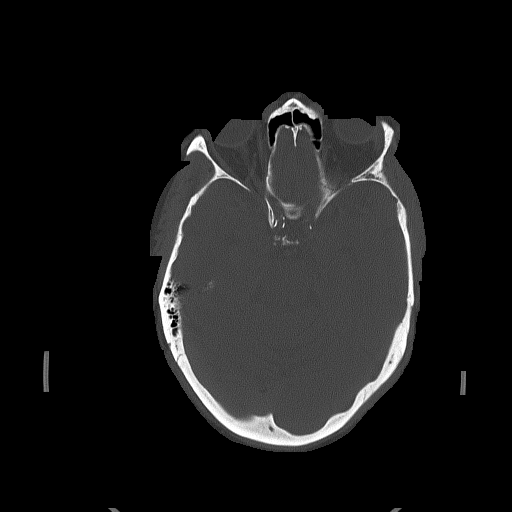
[im 41/92  bone]
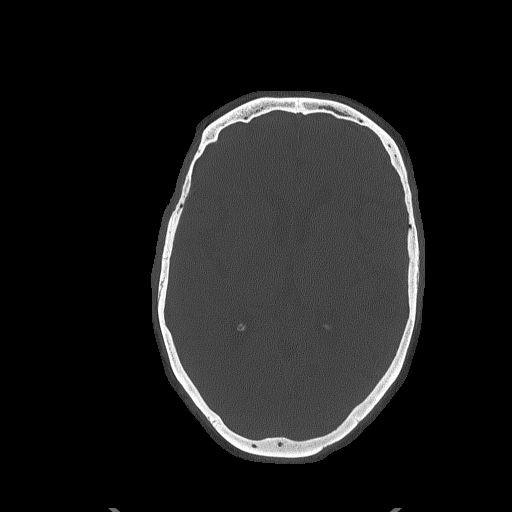

[Series 5: head without cor · coronal · non-contrast · 0.37mm/px · 3 of 75 slices shown]
[im 25/75  brain]
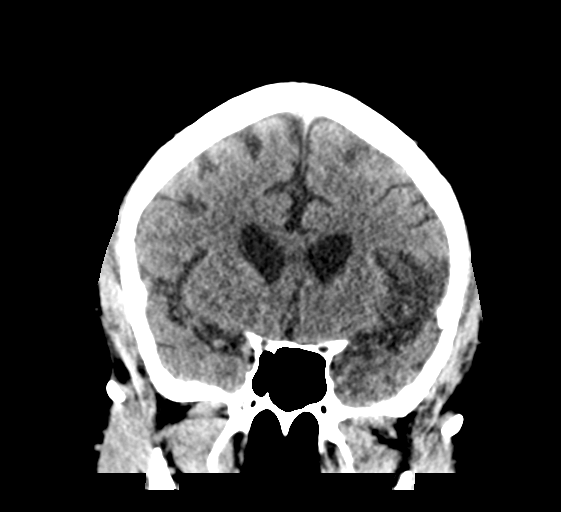
[im 33/75  brain]
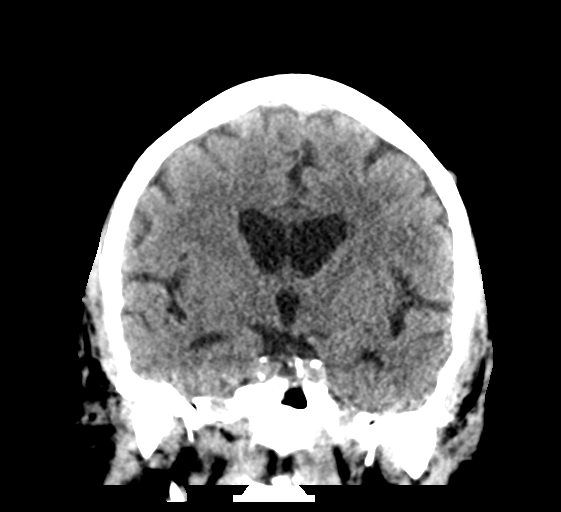
[im 42/75  brain]
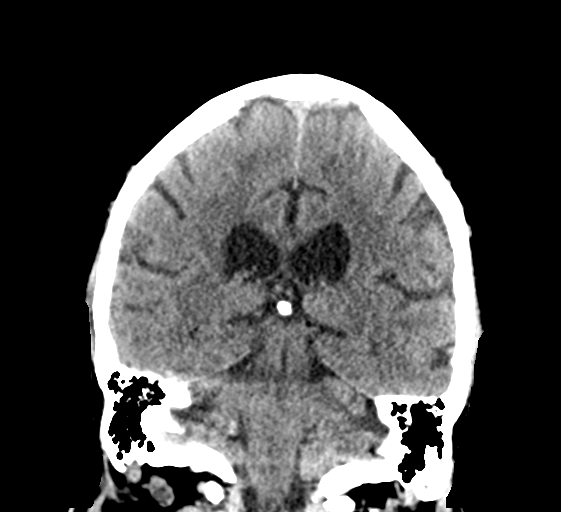

[Series 6: head without sag · sagittal · non-contrast · 0.40mm/px · 3 of 67 slices shown]
[im 23/67  brain]
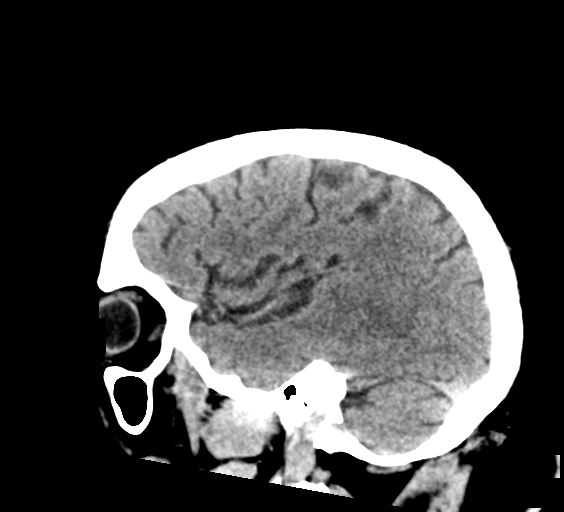
[im 34/67  brain]
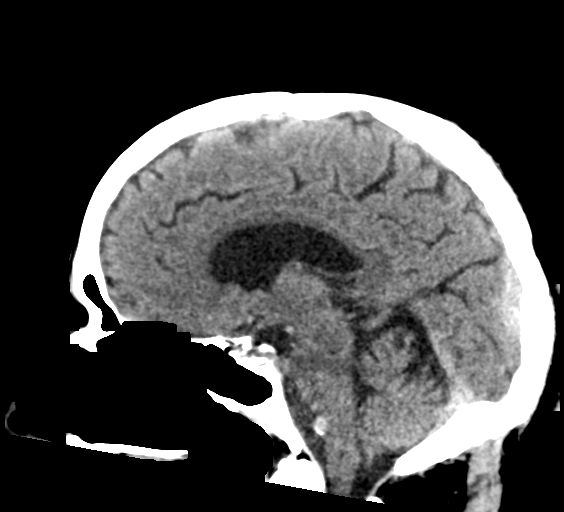
[im 45/67  brain]
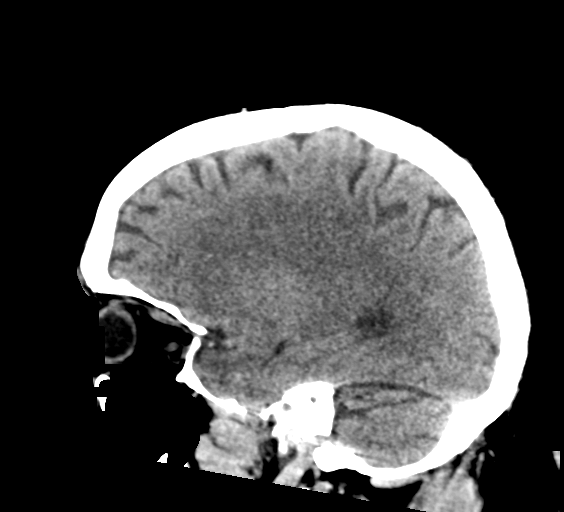

[17 of 47 positions shown; findings below may reference images not displayed]

FINDINGS: Brain: No evidence of acute infarction, hemorrhage, hydrocephalus,
extra-axial collection or mass lesion/mass effect.

Mild periventricular hypodensities likely represent chronic
small-vessel white matter ischemic changes.

Vascular: Intracranial atherosclerotic calcifications noted.

Skull: Normal. Negative for fracture or focal lesion.

Sinuses/Orbits: No acute finding.

Other: None
IMPRESSION: No evidence of acute intracranial abnormality.

Mild probable chronic small-vessel white matter ischemic changes.

## 2018-04-17 ENCOUNTER — Other Ambulatory Visit: Payer: Self-pay | Admitting: Cardiology

## 2018-05-16 ENCOUNTER — Encounter: Payer: Self-pay | Admitting: Cardiology

## 2018-05-17 ENCOUNTER — Other Ambulatory Visit: Payer: Self-pay | Admitting: Cardiology

## 2018-05-17 MED ORDER — PROPRANOLOL HCL ER 80 MG PO CP24
160.0000 mg | ORAL_CAPSULE | Freq: Every day | ORAL | 3 refills | Status: DC
Start: 2018-05-17 — End: 2018-10-09

## 2018-06-10 ENCOUNTER — Other Ambulatory Visit: Payer: Self-pay | Admitting: Cardiology

## 2018-06-11 DIAGNOSIS — E7800 Pure hypercholesterolemia, unspecified: Secondary | ICD-10-CM | POA: Diagnosis not present

## 2018-06-11 DIAGNOSIS — R251 Tremor, unspecified: Secondary | ICD-10-CM | POA: Diagnosis not present

## 2018-06-11 DIAGNOSIS — I10 Essential (primary) hypertension: Secondary | ICD-10-CM | POA: Diagnosis not present

## 2018-06-11 DIAGNOSIS — M109 Gout, unspecified: Secondary | ICD-10-CM | POA: Diagnosis not present

## 2018-06-11 DIAGNOSIS — E291 Testicular hypofunction: Secondary | ICD-10-CM | POA: Diagnosis not present

## 2018-06-11 DIAGNOSIS — E1169 Type 2 diabetes mellitus with other specified complication: Secondary | ICD-10-CM | POA: Diagnosis not present

## 2018-06-11 DIAGNOSIS — D696 Thrombocytopenia, unspecified: Secondary | ICD-10-CM | POA: Diagnosis not present

## 2018-06-11 DIAGNOSIS — R1013 Epigastric pain: Secondary | ICD-10-CM | POA: Diagnosis not present

## 2018-06-11 DIAGNOSIS — F324 Major depressive disorder, single episode, in partial remission: Secondary | ICD-10-CM | POA: Diagnosis not present

## 2018-07-04 ENCOUNTER — Encounter: Payer: Self-pay | Admitting: Neurology

## 2018-07-04 ENCOUNTER — Ambulatory Visit: Payer: Medicare HMO | Admitting: Neurology

## 2018-07-04 VITALS — BP 155/92 | HR 66 | Ht 70.0 in | Wt 237.0 lb

## 2018-07-04 DIAGNOSIS — G4733 Obstructive sleep apnea (adult) (pediatric): Secondary | ICD-10-CM | POA: Insufficient documentation

## 2018-07-04 DIAGNOSIS — G250 Essential tremor: Secondary | ICD-10-CM | POA: Insufficient documentation

## 2018-07-04 MED ORDER — PROPRANOLOL HCL ER 120 MG PO CP24: 240.0000 mg | ORAL_CAPSULE | Freq: Every day | ORAL | 11 refills | Status: DC

## 2018-07-04 MED ORDER — PRIMIDONE 50 MG PO TABS: 50.0000 mg | ORAL_TABLET | Freq: Three times a day (TID) | ORAL | 11 refills | Status: DC

## 2018-07-04 NOTE — Progress Notes (Addendum)
PATIENT: [name] [name] DOB: 10/28/1951  Chief Complaint  Patient presents with  . Tremors    His bilateral, upper extremity tremors have progressively become worse over the last ten years.  He is now having difficulty eating, cooking and writing.    Marland Kitchen PCP    [name], MD     HISTORICAL  [name] [name] is a 67 year old male, seen in request by his primary care physician Dr.Ross, Dwyane Luo, evaluation of tremor, initial evaluation was on August 15th 2019.  I saw him previously in June 2010 for similar complaints, on his [employer], use hand tools all the time, he has strong family history of tremor, suffered significant tremor, point of difficulty holding a cup of water.  Noted the onset bilateral hands tremor is ago, involving both hands, most noticeable when he tried to use his left arm and hand such as using tools, screwdrivers, to the point of affecting his job performance,  He denied loss of smells, he also has past medical history of obesity, hypertension, diabetes, depression  He is taking Inderal 80 mg 2 tablets every day, with some help  CT head without contrast showed no acute abnormality in Jan 2019  He also complains of daytime fatigue, excessive sleepiness, frequent awakening at nighttime, loud snoring  Laboratory evaluations in July 2019, hemoglobin was 16.9, A1c was 8.0,  REVIEW OF SYSTEMS: Full 14 system review of systems performed and notable only for fatigue, sweating legs, snoring, increased thirst, headaches, slurred speech, anxiety, not enough sleep, decreased energy  ALLERGIES: Allergies  Allergen Reactions  . Codeine Other (See Comments)    Vertigo and upset stomach    HOME MEDICATIONS: Current Outpatient Medications  Medication Sig Dispense Refill  . allopurinol (ZYLOPRIM) 300 MG tablet Take 1 tablet (300 mg total) by mouth daily. 30 tablet 6  . amLODipine (NORVASC) 10 MG tablet Take 1 tablet (10 mg total) by mouth  daily. 30 tablet 11  . aspirin 81 MG tablet Take 81 mg by mouth daily.    Marland Kitchen buPROPion (WELLBUTRIN XL) 300 MG 24 hr tablet Take 300 mg by mouth daily.    Marland Kitchen ezetimibe (ZETIA) 10 MG tablet Take 1 tablet (10 mg total) by mouth daily. Please make yearly appt with Dr. Radford Pax for November for future refills. 1st attempt 30 tablet 3  . indomethacin (INDOCIN) 50 MG capsule Take 50 mg as needed by mouth (w/food - take as directed).     Marland Kitchen losartan-hydrochlorothiazide (HYZAAR) 100-25 MG tablet Take 1 tablet daily by mouth. 30 tablet 8  . metFORMIN (GLUCOPHAGE) 500 MG tablet Take 500 mg daily by mouth.    . nitroGLYCERIN (NITROSTAT) 0.4 MG SL tablet Place 1 tablet (0.4 mg total) under the tongue every 5 (five) minutes as needed for chest pain. 25 tablet 0  . propranolol ER (INDERAL LA) 80 MG 24 hr capsule Take 2 capsules (160 mg total) by mouth daily. 180 capsule 3  . rosuvastatin (CRESTOR) 20 MG tablet Take 1 tablet (20 mg total) by mouth daily. 30 tablet 11   No current facility-administered medications for this visit.     PAST MEDICAL HISTORY: Past Medical History:  Diagnosis Date  . Coronary artery disease    s/p PCI of LAD w Residual 50-60% RCA  . Diabetes ([facility])   . Dilated aortic root (HCC)    42mm by echo 01/2018  . High cholesterol   . History of echocardiogram 06/2011   Normal LVF,mild LVH,trivial  TR/MR, mild AS/AI  . History of kidney stones   . Hypertension   . Mild aortic stenosis    mean AV gradient 6mmHg by echo 02/2016  . Tremor     PAST SURGICAL HISTORY: Past Surgical History:  Procedure Laterality Date  . arm surgery Right   . stents - placement     x 2    FAMILY HISTORY: Family History  Problem Relation Age of Onset  . Other Father        unsure of history  . CAD Brother   . Heart failure Mother   . Emphysema Mother   . CAD Mother     SOCIAL HISTORY: Social History   Socioeconomic History  . Marital status: Married    Spouse name: Not on file  . Number of  children: 2  . Years of education: 41  . Highest education level: High school graduate  Occupational History  . Occupation: own Optometrist business  Social Needs  . Financial resource strain: Not on file  . Food insecurity:    Worry: Not on file    Inability: Not on file  . Transportation needs:    Medical: Not on file    Non-medical: Not on file  Tobacco Use  . Smoking status: Never Smoker  . Smokeless tobacco: Never Used  Substance and Sexual Activity  . Alcohol use: Yes    Comment: socially  . Drug use: No  . Sexual activity: Not on file  Lifestyle  . Physical activity:    Days per week: Not on file    Minutes per session: Not on file  . Stress: Not on file  Relationships  . Social connections:    Talks on phone: Not on file    Gets together: Not on file    Attends religious service: Not on file    Active member of club or organization: Not on file    Attends meetings of clubs or organizations: Not on file    Relationship status: Not on file  . Intimate partner violence:    Fear of current or ex partner: Not on file    Emotionally abused: Not on file    Physically abused: Not on file    Forced sexual activity: Not on file  Other Topics Concern  . Not on file  Social History Narrative   Lives at home with his wife.   Left-handed.   1 cup caffeine per day.     PHYSICAL EXAM   Vitals:   07/04/18 0821  BP: (!) 155/92  Pulse: 66  Weight: 237 lb (107.5 kg)  Height: 5\' 10"  (1.778 m)    Not recorded      Body mass index is 34.01 kg/m.  PHYSICAL EXAMNIATION:  Gen: NAD, conversant, well nourised, obese, well groomed                     Cardiovascular: Regular rate rhythm, no peripheral edema, warm, nontender. Eyes: Conjunctivae clear without exudates or hemorrhage Neck: Supple, no carotid bruits. Pulmonary: Clear to auscultation bilaterally   NEUROLOGICAL EXAM:  MENTAL STATUS: Speech:    Speech is normal; fluent and spontaneous with normal  comprehension.  Cognition:     Orientation to time, place and person     Normal recent and remote memory     Normal Attention span and concentration     Normal Language, naming, repeating,spontaneous speech     Fund of knowledge   CRANIAL NERVES: CN II:  Visual fields are full to confrontation. Fundoscopic exam is normal with sharp discs and no vascular changes. Pupils are round equal and briskly reactive to light. CN III, IV, VI: extraocular movement are normal. No ptosis. CN V: Facial sensation is intact to pinprick in all 3 divisions bilaterally. Corneal responses are intact.  CN VII: Face is symmetric with normal eye closure and smile. CN VIII: Hearing is normal to rubbing fingers CN IX, X: Palate elevates symmetrically. Phonation is normal. CN XI: Head turning and shoulder shrug are intact CN XII: Tongue is midline with normal movements and no atrophy.  Narrow oral pharyngeal space.  MOTOR: Bilateral hands posturing tremor, no weakness, no rigidity, no bradykinesia REFLEXES: Reflexes are 2+ and symmetric at the biceps, triceps, knees, and ankles. Plantar responses are flexor.  SENSORY: Intact to light touch, pinprick, positional sensation and vibratory sensation are intact in fingers and toes.  COORDINATION: Rapid alternating movements and fine finger movements are intact. There is no dysmetria on finger-to-nose and heel-knee-shin.    GAIT/STANCE: Posture is normal. Gait is steady with normal steps, base, arm swing, and turning. Heel and toe walking are normal. Tandem gait is normal.  Romberg is absent.   DIAGNOSTIC DATA (LABS, IMAGING, TESTING) - I reviewed patient records, labs, notes, testing and imaging myself where available.   ASSESSMENT AND PLAN  JHEREMY BOGER is a 67 y.o. male   Essential tremor  Progressively worse  Check TSH.  Higher dose of inderal LA 120mg  bid.  Add on primidone 50mg  tid.   Obstructive sleep apnea  We will  refer him to sleep  study   Marcial Pacas, M.D. Ph.D.  [facility] [address] [place], [place] [address] Ph: [phone] Fax: [phone]  CC:  [name], MD

## 2018-07-05 LAB — TSH: TSH: 4.15 u[IU]/mL (ref 0.450–4.500)

## 2018-08-26 ENCOUNTER — Institutional Professional Consult (permissible substitution): Payer: Medicare HMO | Admitting: Neurology

## 2018-10-01 ENCOUNTER — Other Ambulatory Visit: Payer: Self-pay | Admitting: Cardiology

## 2018-10-09 ENCOUNTER — Ambulatory Visit: Payer: Medicare HMO | Admitting: Neurology

## 2018-10-09 ENCOUNTER — Encounter: Payer: Self-pay | Admitting: Neurology

## 2018-10-09 VITALS — BP 172/91 | HR 59 | Ht 70.0 in | Wt 237.0 lb

## 2018-10-09 DIAGNOSIS — G4719 Other hypersomnia: Secondary | ICD-10-CM | POA: Diagnosis not present

## 2018-10-09 DIAGNOSIS — Z820 Family history of epilepsy and other diseases of the nervous system: Secondary | ICD-10-CM | POA: Diagnosis not present

## 2018-10-09 DIAGNOSIS — R0683 Snoring: Secondary | ICD-10-CM

## 2018-10-09 DIAGNOSIS — Z955 Presence of coronary angioplasty implant and graft: Secondary | ICD-10-CM

## 2018-10-09 DIAGNOSIS — R351 Nocturia: Secondary | ICD-10-CM

## 2018-10-09 DIAGNOSIS — E669 Obesity, unspecified: Secondary | ICD-10-CM | POA: Diagnosis not present

## 2018-10-09 NOTE — Progress Notes (Signed)
Subjective:    Patient ID: [name] [name] is a 67 y.o. male.  HPI     Star Age, MD, PhD [facility] [address]O. [place], [place] [address]  Dear Aliene Beams,   I saw your patient, [name] [name], upon your kind request in the sleep clinic today for initial consultation of his sleep disorder, in particular, concern for underlying obstructive sleep apnea. The patient is unaccompanied today. As you know, [name] [name] is a 67 year old left-handed gentleman with an underlying medical history of tremor, history of aortic stenosis, hypertension, kidney stones, hyperlipidemia, diabetes, coronary artery disease with status post stent placement 2, and obesity, who reports snoring and excessive daytime somnolence. I reviewed your office note from 07/04/2018. He was started on primidone at the time and his propranolol was increased. He reports that the tremors better but he did have initial issues with headaches when he first started the primidone. He gradually increase it and the headaches improved. He is quite pleased with his tremor control. As far as his sleep, he does report loud snoring at times and feels tired during the day but not sleeping enough to fall asleep inadvertently. In the past, he was in the habit of taking a nap but avoids naps at this point. He does have a family history of sleep apnea, his brother has a CPAP machine and his 2 younger son has a CPAP machine. He has 2 sons. 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 Patient is a nonsmoker and drinks alcohol occasionally, maybe a couple of times a week and drinks caffeine in the form of coffee, 2 cups per day on average. Increase in caffeine makes him jittery. He tries to hydrate well with water. He had a tonsillectomy and adenoidectomy as a child. Weight has been more or less stable but he has not been exercising on a regular basis recently. He is motivated to get back on track with this.  He denies morning headaches but does have nocturia about twice per average night. His bedtime is fairly consistent around 8:30 or 9, rise time is between 4:30 and 5. He has no pets, does not watch TV in his bedroom. Epworth sleepiness score is 6 out of 24, fatigue score is 35 out of 63.   His Past Medical History Is Significant For: Past Medical History:  Diagnosis Date  . Coronary artery disease    s/p PCI of LAD w Residual 50-60% RCA  . Diabetes ([place])   . Dilated aortic root ([facility])    53mm by echo 01/2018  . High cholesterol   . History of echocardiogram 06/2011   Normal LVF,mild LVH,trivial TR/MR, mild AS/AI  . History of kidney stones   . Hypertension   . Mild aortic stenosis    mean AV gradient 62mmHg by echo 02/2016  . Tremor     His Past Surgical History Is Significant For: Past Surgical History:  Procedure Laterality Date  . arm surgery Right   . stents - placement     x 2    His Family History Is Significant For: Family History  Problem Relation Age of Onset  . Other Father        unsure of history  . CAD Brother   . Heart failure Mother   . Emphysema Mother   . CAD Mother     His Social History Is Significant For: Social History   Socioeconomic History  . Marital status: Married  Spouse name: Not on file  . Number of children: 2  . Years of education: 73  . Highest education level: High school graduate  Occupational History  . Occupation: own Optometrist business  Social Needs  . Financial resource strain: Not on file  . Food insecurity:    Worry: Not on file    Inability: Not on file  . Transportation needs:    Medical: Not on file    Non-medical: Not on file  Tobacco Use  . Smoking status: Never Smoker  . Smokeless tobacco: Never Used  Substance and Sexual Activity  . Alcohol use: Yes    Comment: socially  . Drug use: No  . Sexual activity: Not on file  Lifestyle  . Physical activity:    Days per week: Not on file    Minutes per  session: Not on file  . Stress: Not on file  Relationships  . Social connections:    Talks on phone: Not on file    Gets together: Not on file    Attends religious service: Not on file    Active member of club or organization: Not on file    Attends meetings of clubs or organizations: Not on file    Relationship status: Not on file  Other Topics Concern  . Not on file  Social History Narrative   Lives at home with his wife.   Left-handed.   1 cup caffeine per day.    His Allergies Are:  Allergies  Allergen Reactions  . Codeine Other (See Comments)    Vertigo and upset stomach  :   His Current Medications Are:  Outpatient Encounter Medications as of 10/09/2018  Medication Sig  . allopurinol (ZYLOPRIM) 300 MG tablet Take 1 tablet (300 mg total) by mouth daily.  Marland Kitchen amLODipine (NORVASC) 10 MG tablet Take 1 tablet (10 mg total) by mouth daily.  Marland Kitchen aspirin 81 MG tablet Take 81 mg by mouth daily.  Marland Kitchen buPROPion (WELLBUTRIN XL) 300 MG 24 hr tablet Take 300 mg by mouth daily.  Marland Kitchen ezetimibe (ZETIA) 10 MG tablet Take 1 tablet (10 mg total) by mouth daily. Patient needs to call and schedule for further refills 1st attempt  . indomethacin (INDOCIN) 50 MG capsule Take 50 mg as needed by mouth (w/food - take as directed).   Marland Kitchen losartan-hydrochlorothiazide (HYZAAR) 100-25 MG tablet Take 1 tablet daily by mouth.  . metFORMIN (GLUCOPHAGE) 500 MG tablet Take 500 mg daily by mouth.  . nitroGLYCERIN (NITROSTAT) 0.4 MG SL tablet Place 1 tablet (0.4 mg total) under the tongue every 5 (five) minutes as needed for chest pain.  . primidone (MYSOLINE) 50 MG tablet Take 1 tablet (50 mg total) by mouth 3 (three) times daily.  . propranolol ER (INDERAL LA) 120 MG 24 hr capsule Take 2 capsules (240 mg total) by mouth daily.  . rosuvastatin (CRESTOR) 20 MG tablet Take 1 tablet (20 mg total) by mouth daily.  . [DISCONTINUED] propranolol ER (INDERAL LA) 80 MG 24 hr capsule Take 2 capsules (160 mg total) by mouth  daily.   No facility-administered encounter medications on file as of 10/09/2018.   :  Review of Systems:  Out of a complete 14 point review of systems, all are reviewed and negative with the exception of these symptoms as listed below:  Review of Systems  Neurological:       Pt presents today to discuss his sleep. Pt has never had a sleep study but does endorse snoring.  Epworth Sleepiness Scale 0= would never doze 1= slight chance of dozing 2= moderate chance of dozing 3= high chance of dozing  Sitting and reading: 1 Watching TV: 1 Sitting inactive in a public place (ex. Theater or meeting): 1 As a passenger in a car for an hour without a break: 0 Lying down to rest in the afternoon: 3 Sitting and talking to someone: 0 Sitting quietly after lunch (no alcohol): 0 In a car, while stopped in traffic: 0 Total: 6     Objective:  Neurological Exam  Physical Exam Physical Examination:   Vitals:   10/09/18 1114  BP: (!) 172/91  Pulse: (!) 59    General Examination: The patient is a very pleasant 67 y.o. male in no acute distress. He appears well-developed and well-nourished and well groomed.   HEENT: Normocephalic, atraumatic, pupils are equal, round and reactive to light and accommodation. He has a very mild head tremor. He has no significant voice tremor. Hearing is grossly intact. Face is symmetric, no facial masking. Airway examination reveals mild mouth dryness, adequate dental hygiene, he has moderate airway crowding secondary to a fairly small airway opening and redundant soft palate. Mallampati is class III. Tonsils are absent. Neck circumference is 19-1/4 inches. Tongue protrudes centrally and palate elevates symmetrically.  Chest: Clear to auscultation without wheezing, rhonchi or crackles noted.  Heart: S1+S2+0, regular and normal without murmurs, rubs or gallops noted.   Abdomen: Soft, non-tender and non-distended with normal bowel sounds appreciated on  auscultation.  Extremities: There is no pitting edema in the distal lower extremities bilaterally.   Skin: Warm and dry without trophic changes noted.  Musculoskeletal: exam reveals no obvious joint deformities, tenderness or joint swelling or erythema.   Neurologically:  Mental status: The patient is awake, alert and oriented in all 4 spheres. His immediate and remote memory, attention, language skills and fund of knowledge are appropriate. There is no evidence of aphasia, agnosia, apraxia or anomia. Speech is clear with normal prosody and enunciation. Thought process is linear. Mood is normal and affect is normal.  Cranial nerves II - XII are as described above under HEENT exam. In addition: shoulder shrug is normal with equal shoulder height noted. Motor exam: Normal bulk, strength and tone is noted. There is no resting tremor. He has a minimal bilateral upper extremity postural and action tremor. Handwriting is mildly tremulous. Romberg is negative. Fine motor skills and coordination: intact grossly.   Cerebellar testing: No dysmetria or intention tremor on finger to nose testing. Heel to shin is unremarkable bilaterally. There is no truncal or gait ataxia.  Sensory exam: intact to light touch in the upper and lower extremities.  Gait, station and balance: He stands easily. No veering to one side is noted. No leaning to one side is noted. Posture is age-appropriate and stance is narrow based. Gait shows normal stride length and normal pace. No problems turning are noted. Tandem walk is slightly challenging.  Assessment and Plan:  In summary, [name] [name] is a very pleasant 67 y.o.-year old male with an underlying medical history of essential tremor, history of aortic stenosis, hypertension, kidney stones, hyperlipidemia, diabetes, coronary artery disease with status post stent placement 2, and obesity, whose history and physical exam are concerning for obstructive sleep apnea (OSA). I had a  long chat with the patient about my findings and the diagnosis of OSA, its prognosis and treatment options. We talked about medical treatments, surgical interventions and non-pharmacological approaches. I explained in  particular the risks and ramifications of untreated moderate to severe OSA, especially with respect to developing cardiovascular disease down the Road, including congestive heart failure, difficult to treat hypertension, cardiac arrhythmias, or stroke. Even type 2 diabetes has, in part, been linked to untreated OSA. Symptoms of untreated OSA include daytime sleepiness, memory problems, mood irritability and mood disorder such as depression and anxiety, lack of energy, as well as recurrent headaches, especially morning headaches. We talked about trying to maintain a healthy lifestyle in general, as well as the importance of weight control. I encouraged the patient to eat healthy, exercise daily and keep well hydrated, to keep a scheduled bedtime and wake time routine, to not skip any meals and eat healthy snacks in between meals. I advised the patient not to drive when feeling sleepy. I recommended the following at this time: sleep study with potential positive airway pressure titration. (We will score hypopneas at 4%).   I explained the sleep test procedure to the patient and also outlined possible surgical and non-surgical treatment options of OSA, including the use of a custom-made dental device (which would require a referral to a specialist dentist or oral surgeon), upper airway surgical options, such as pillar implants, radiofrequency surgery, tongue base surgery, and UPPP (which would involve a referral to an ENT surgeon). Rarely, jaw surgery such as mandibular advancement may be considered.  I also explained the CPAP treatment option to the patient, who indicated that he would be willing to try CPAP if the need arises. I explained the importance of being compliant with PAP treatment, not  only for insurance purposes but primarily to improve His symptoms, and for the patient's long term health benefit, including to reduce His cardiovascular risks. I answered all his questions today and the patient was in agreement. I plan to see him back after the sleep study is completed and encouraged him to call with any interim questions, concerns, problems or updates.   Thank you very much for allowing me to participate in the care of this nice patient. If I can be of any further assistance to you please do not hesitate to call me at [phone].  Sincerely,   Star Age, MD, PhD

## 2018-10-09 NOTE — Patient Instructions (Signed)

## 2018-10-23 DIAGNOSIS — H524 Presbyopia: Secondary | ICD-10-CM | POA: Diagnosis not present

## 2018-10-23 DIAGNOSIS — H25041 Posterior subcapsular polar age-related cataract, right eye: Secondary | ICD-10-CM | POA: Diagnosis not present

## 2018-11-04 ENCOUNTER — Telehealth: Payer: Self-pay | Admitting: *Deleted

## 2018-11-04 ENCOUNTER — Ambulatory Visit: Payer: Medicare HMO | Admitting: Neurology

## 2018-11-04 NOTE — Telephone Encounter (Signed)
No showed follow up appointment. 

## 2018-11-05 ENCOUNTER — Encounter: Payer: Self-pay | Admitting: Neurology

## 2018-11-21 DIAGNOSIS — H25043 Posterior subcapsular polar age-related cataract, bilateral: Secondary | ICD-10-CM | POA: Diagnosis not present

## 2018-11-30 ENCOUNTER — Other Ambulatory Visit: Payer: Self-pay | Admitting: Cardiology

## 2018-12-18 DIAGNOSIS — H25811 Combined forms of age-related cataract, right eye: Secondary | ICD-10-CM | POA: Diagnosis not present

## 2018-12-18 DIAGNOSIS — H2511 Age-related nuclear cataract, right eye: Secondary | ICD-10-CM | POA: Diagnosis not present

## 2018-12-27 ENCOUNTER — Other Ambulatory Visit: Payer: Self-pay | Admitting: Cardiology

## 2018-12-31 ENCOUNTER — Telehealth: Payer: Self-pay | Admitting: Neurology

## 2018-12-31 NOTE — Telephone Encounter (Signed)
We have attempted to call the patient 2 times to schedule sleep study. Patient has been unavailable at the phone numbers we have on file and has not returned our calls. At this point we will send a letter asking pt to please contact the sleep lab to schedule their sleep study. If patient calls back we will schedule them for their sleep study. ° °

## 2019-01-06 DIAGNOSIS — H0012 Chalazion right lower eyelid: Secondary | ICD-10-CM | POA: Diagnosis not present

## 2019-01-13 ENCOUNTER — Ambulatory Visit: Payer: Medicare HMO | Admitting: Neurology

## 2019-01-13 DIAGNOSIS — Z955 Presence of coronary angioplasty implant and graft: Secondary | ICD-10-CM

## 2019-01-13 DIAGNOSIS — Z820 Family history of epilepsy and other diseases of the nervous system: Secondary | ICD-10-CM

## 2019-01-13 DIAGNOSIS — G4710 Hypersomnia, unspecified: Secondary | ICD-10-CM | POA: Diagnosis not present

## 2019-01-13 DIAGNOSIS — R351 Nocturia: Secondary | ICD-10-CM

## 2019-01-13 DIAGNOSIS — G4719 Other hypersomnia: Secondary | ICD-10-CM

## 2019-01-13 DIAGNOSIS — E669 Obesity, unspecified: Secondary | ICD-10-CM

## 2019-01-13 DIAGNOSIS — R0683 Snoring: Secondary | ICD-10-CM

## 2019-01-14 ENCOUNTER — Other Ambulatory Visit: Payer: Self-pay | Admitting: Cardiology

## 2019-01-15 ENCOUNTER — Telehealth: Payer: Self-pay

## 2019-01-15 NOTE — Procedures (Signed)
  [facility] @[facility] [address] [place], [place] [address] NAME:     [name] [name]                                                                    DOB: 07/11/1951 MEDICAL RECORD no: [id number]                                                 DOS: 01/13/2019 REFERRING PHYSICIAN: Marcial Pacas, MD STUDY PERFORMED: HST on [employer] HISTORY: 68 year old man with a history of tremor, history of aortic stenosis, hypertension, kidney stones, hyperlipidemia, diabetes, coronary artery disease with status post stent placement 2, and obesity, who reports snoring and excessive daytime somnolence. His Epworth sleepiness score is 6 out of 24, BMI of 34.  STUDY RESULTS:   Total Recording Time: 9 hrs,8 mins; Total Sleep Time:  8 hrs, 9 mins Total Apnea/Hypopnea Index (AHI): 3.8/h; RDI: 4.4 /h; REM AHI: 11.7 /h Average Oxygen Saturation:  94 %; Lowest Oxygen Desaturation: 90 %  Total Time Oxygen Saturation Below or at 88 %: 0 minutes  Average Heart Rate:  54 bpm (between 47 and 68 bpm) IMPRESSION: Snoring RECOMMENDATION: This home sleep test does not demonstrate any significant obstructive or central sleep disordered breathing. Some snoring was noted, especially in the supine position. Weight loss and avoidance of the supine sleep position will likely improve the snoring.  Other causes of the patient's symptoms, including circadian rhythm disturbances, an underlying mood disorder, medication effect and/or an underlying medical problem cannot be ruled out based on this test. Clinical correlation is recommended. The patient should be cautioned not to drive, work at heights, or operate dangerous or heavy equipment when tired or sleepy. Review and reiteration of good sleep hygiene measures should be pursued with any patient. The patient can follow up with his referring provider, who will be notified of the test results.   I certify that I have reviewed the raw data recording prior to the  issuance of this report in accordance with the standards of the American Academy of Sleep Medicine (AASM). Star Age, MD, PhD [facility]) Diplomat, ABPN (Neurology and Sleep)

## 2019-01-15 NOTE — Progress Notes (Signed)
Patient referred by Dr. Krista Blue, seen by me on 10/09/18, HST on 01/13/19.   Please call and notify the patient that the recent home sleep test did not show any significant obstructive sleep apnea. Some snoring was noted and he is advised to lose weight and avoid sleeping on his back. He can follow up with Dr. Krista Blue and his PCP.  Please remind patient to try to maintain good sleep hygiene, which means: Keep a regular sleep and wake schedule and make enough time for sleep (7 1/2 to 8 1/2 hours for the average adult), try not to exercise or have a meal within 2 hours of your bedtime, try to keep your bedroom conducive for sleep, that is, cool and dark, without light distractors such as an illuminated alarm clock, and refrain from watching TV right before sleep or in the middle of the night and do not keep the TV or radio on during the night. If a nightlight is used, have it away from the visual field. Also, try not to use or play on electronic devices at bedtime, such as your cell phone, tablet PC or laptop. If you like to read at bedtime on an electronic device, try to dim the background light as much as possible. Do not eat in the middle of the night. Keep pets away from the bedroom environment. For stress relief, try meditation, deep breathing exercises (there are many books and CDs available), a white noise machine or fan can help to diffuse other noise distractors, such as traffic noise. Do not drink alcohol before bedtime, as it can disturb sleep and cause middle of the night awakenings. Never mix alcohol and sedating medications! Avoid narcotic pain medication close to bedtime, as opioids/narcotics can suppress breathing drive and breathing effort.    Thanks,  Star Age, MD, PhD [facility])

## 2019-01-15 NOTE — Telephone Encounter (Signed)
-----   Message from [name] Age, MD sent at 01/15/2019  8:08 AM EST ----- Patient referred by Dr. Krista Blue, seen by me on 10/09/18, HST on 01/13/19.   Please call and notify the patient that the recent home sleep test did not show any significant obstructive sleep apnea. Some snoring was noted and he is advised to lose weight and avoid sleeping on his back. He can follow up with Dr. Krista Blue and his PCP.  Please remind patient to try to maintain good sleep hygiene, which means: Keep a regular sleep and wake schedule and make enough time for sleep (7 1/2 to 8 1/2 hours for the average adult), try not to exercise or have a meal within 2 hours of your bedtime, try to keep your bedroom conducive for sleep, that is, cool and dark, without light distractors such as an illuminated alarm clock, and refrain from watching TV right before sleep or in the middle of the night and do not keep the TV or radio on during the night. If a nightlight is used, have it away from the visual field. Also, try not to use or play on electronic devices at bedtime, such as your cell phone, tablet PC or laptop. If you like to read at bedtime on an electronic device, try to dim the background light as much as possible. Do not eat in the middle of the night. Keep pets away from the bedroom environment. For stress relief, try meditation, deep breathing exercises (there are many books and CDs available), a white noise machine or fan can help to diffuse other noise distractors, such as traffic noise. Do not drink alcohol before bedtime, as it can disturb sleep and cause middle of the night awakenings. Never mix alcohol and sedating medications! Avoid narcotic pain medication close to bedtime, as opioids/narcotics can suppress breathing drive and breathing effort.    Thanks,  [name] Age, MD, PhD [facility])

## 2019-01-15 NOTE — Telephone Encounter (Signed)
I called pt. I advised pt that Dr. Rexene Alberts reviewed pt's sleep study and found that pt did not show any significant osa. Dr. Rexene Alberts recommends that pt avoid sleeping on his back and pursue weight loss to help with the snoring noted. Dr. Rexene Alberts also recommends that pt follow up with Dr. Krista Blue and his PCP I reviewed sleep hygiene recommendations with the pt, including trying to keep a regular sleep wake schedule, avoiding electronics in the bedroom, keeping the bedroom cool, dark, and quiet, and avoiding eating or exercising within 2 hours of bedtime as well as eating in the middle of the night. I advised pt to keep pets out of the bedroom. I discussed with pt the importance of stress relief and to try meditation, deep breathing exercises, and/or a white noise machine or fan to diffuse other noise distractors. I advised pt to not drink alcohol before bedtime and to never mix alcohol and sedating medications. Pt was advised to avoid narcotic pain medication close to bedtime. I advised pt that a copy of these sleep study results will be sent to Dr. Krista Blue. Pt verbalized understanding of results. Pt had no questions at this time but was encouraged to call back if questions arise.

## 2019-01-22 ENCOUNTER — Encounter: Payer: Self-pay | Admitting: Cardiology

## 2019-01-24 DIAGNOSIS — I25119 Atherosclerotic heart disease of native coronary artery with unspecified angina pectoris: Secondary | ICD-10-CM | POA: Diagnosis not present

## 2019-01-24 DIAGNOSIS — E291 Testicular hypofunction: Secondary | ICD-10-CM | POA: Diagnosis not present

## 2019-01-24 DIAGNOSIS — E1169 Type 2 diabetes mellitus with other specified complication: Secondary | ICD-10-CM | POA: Diagnosis not present

## 2019-01-29 ENCOUNTER — Other Ambulatory Visit: Payer: Self-pay | Admitting: Cardiology

## 2019-01-29 DIAGNOSIS — F324 Major depressive disorder, single episode, in partial remission: Secondary | ICD-10-CM | POA: Diagnosis not present

## 2019-01-29 DIAGNOSIS — I10 Essential (primary) hypertension: Secondary | ICD-10-CM | POA: Diagnosis not present

## 2019-01-29 DIAGNOSIS — E1169 Type 2 diabetes mellitus with other specified complication: Secondary | ICD-10-CM | POA: Diagnosis not present

## 2019-02-09 ENCOUNTER — Other Ambulatory Visit: Payer: Self-pay | Admitting: Cardiology

## 2019-03-04 DIAGNOSIS — Z0000 Encounter for general adult medical examination without abnormal findings: Secondary | ICD-10-CM | POA: Diagnosis not present

## 2019-03-04 DIAGNOSIS — I10 Essential (primary) hypertension: Secondary | ICD-10-CM | POA: Diagnosis not present

## 2019-03-04 DIAGNOSIS — E7800 Pure hypercholesterolemia, unspecified: Secondary | ICD-10-CM | POA: Diagnosis not present

## 2019-03-04 DIAGNOSIS — E1169 Type 2 diabetes mellitus with other specified complication: Secondary | ICD-10-CM | POA: Diagnosis not present

## 2019-03-04 DIAGNOSIS — Z125 Encounter for screening for malignant neoplasm of prostate: Secondary | ICD-10-CM | POA: Diagnosis not present

## 2019-03-04 DIAGNOSIS — M109 Gout, unspecified: Secondary | ICD-10-CM | POA: Diagnosis not present

## 2019-03-04 DIAGNOSIS — F324 Major depressive disorder, single episode, in partial remission: Secondary | ICD-10-CM | POA: Diagnosis not present

## 2019-03-04 DIAGNOSIS — E291 Testicular hypofunction: Secondary | ICD-10-CM | POA: Diagnosis not present

## 2019-03-04 DIAGNOSIS — R251 Tremor, unspecified: Secondary | ICD-10-CM | POA: Diagnosis not present

## 2019-03-17 DIAGNOSIS — R42 Dizziness and giddiness: Secondary | ICD-10-CM | POA: Diagnosis not present

## 2019-03-17 DIAGNOSIS — H6123 Impacted cerumen, bilateral: Secondary | ICD-10-CM | POA: Diagnosis not present

## 2019-06-05 DIAGNOSIS — R05 Cough: Secondary | ICD-10-CM | POA: Diagnosis not present

## 2019-06-07 ENCOUNTER — Other Ambulatory Visit: Payer: Self-pay

## 2019-06-07 ENCOUNTER — Encounter (HOSPITAL_COMMUNITY): Payer: Self-pay

## 2019-06-07 ENCOUNTER — Emergency Department (HOSPITAL_COMMUNITY): Payer: Medicare HMO

## 2019-06-07 ENCOUNTER — Inpatient Hospital Stay (HOSPITAL_COMMUNITY)
Admission: EM | Admit: 2019-06-07 | Discharge: 2019-06-12 | DRG: 177 | Disposition: A | Discharge status: Home / Self Care | Payer: Medicare HMO | Attending: Internal Medicine | Admitting: Internal Medicine

## 2019-06-07 DIAGNOSIS — I4891 Unspecified atrial fibrillation: Secondary | ICD-10-CM | POA: Diagnosis present

## 2019-06-07 DIAGNOSIS — G250 Essential tremor: Secondary | ICD-10-CM | POA: Diagnosis present

## 2019-06-07 DIAGNOSIS — Z9861 Coronary angioplasty status: Secondary | ICD-10-CM

## 2019-06-07 DIAGNOSIS — E1165 Type 2 diabetes mellitus with hyperglycemia: Secondary | ICD-10-CM | POA: Diagnosis not present

## 2019-06-07 DIAGNOSIS — K529 Noninfective gastroenteritis and colitis, unspecified: Secondary | ICD-10-CM | POA: Diagnosis not present

## 2019-06-07 DIAGNOSIS — I350 Nonrheumatic aortic (valve) stenosis: Secondary | ICD-10-CM | POA: Diagnosis present

## 2019-06-07 DIAGNOSIS — Z885 Allergy status to narcotic agent status: Secondary | ICD-10-CM | POA: Diagnosis not present

## 2019-06-07 DIAGNOSIS — J1289 Other viral pneumonia: Secondary | ICD-10-CM | POA: Diagnosis present

## 2019-06-07 DIAGNOSIS — I77810 Thoracic aortic ectasia: Secondary | ICD-10-CM | POA: Diagnosis not present

## 2019-06-07 DIAGNOSIS — Z8249 Family history of ischemic heart disease and other diseases of the circulatory system: Secondary | ICD-10-CM

## 2019-06-07 DIAGNOSIS — J9601 Acute respiratory failure with hypoxia: Secondary | ICD-10-CM | POA: Diagnosis not present

## 2019-06-07 DIAGNOSIS — Z7982 Long term (current) use of aspirin: Secondary | ICD-10-CM

## 2019-06-07 DIAGNOSIS — E7800 Pure hypercholesterolemia, unspecified: Secondary | ICD-10-CM | POA: Diagnosis not present

## 2019-06-07 DIAGNOSIS — R0902 Hypoxemia: Secondary | ICD-10-CM | POA: Diagnosis not present

## 2019-06-07 DIAGNOSIS — R509 Fever, unspecified: Secondary | ICD-10-CM | POA: Diagnosis not present

## 2019-06-07 DIAGNOSIS — J189 Pneumonia, unspecified organism: Secondary | ICD-10-CM | POA: Diagnosis not present

## 2019-06-07 DIAGNOSIS — Z825 Family history of asthma and other chronic lower respiratory diseases: Secondary | ICD-10-CM

## 2019-06-07 DIAGNOSIS — I951 Orthostatic hypotension: Secondary | ICD-10-CM | POA: Diagnosis present

## 2019-06-07 DIAGNOSIS — I10 Essential (primary) hypertension: Secondary | ICD-10-CM | POA: Diagnosis present

## 2019-06-07 DIAGNOSIS — E785 Hyperlipidemia, unspecified: Secondary | ICD-10-CM | POA: Diagnosis present

## 2019-06-07 DIAGNOSIS — J129 Viral pneumonia, unspecified: Secondary | ICD-10-CM | POA: Diagnosis present

## 2019-06-07 DIAGNOSIS — T380X5A Adverse effect of glucocorticoids and synthetic analogues, initial encounter: Secondary | ICD-10-CM | POA: Diagnosis not present

## 2019-06-07 DIAGNOSIS — Z209 Contact with and (suspected) exposure to unspecified communicable disease: Secondary | ICD-10-CM | POA: Diagnosis not present

## 2019-06-07 DIAGNOSIS — Z7984 Long term (current) use of oral hypoglycemic drugs: Secondary | ICD-10-CM | POA: Diagnosis not present

## 2019-06-07 DIAGNOSIS — I2510 Atherosclerotic heart disease of native coronary artery without angina pectoris: Secondary | ICD-10-CM | POA: Diagnosis present

## 2019-06-07 DIAGNOSIS — R05 Cough: Secondary | ICD-10-CM | POA: Diagnosis not present

## 2019-06-07 DIAGNOSIS — D696 Thrombocytopenia, unspecified: Secondary | ICD-10-CM | POA: Diagnosis present

## 2019-06-07 DIAGNOSIS — U071 COVID-19: Principal | ICD-10-CM

## 2019-06-07 DIAGNOSIS — R531 Weakness: Secondary | ICD-10-CM | POA: Diagnosis not present

## 2019-06-07 DIAGNOSIS — A419 Sepsis, unspecified organism: Secondary | ICD-10-CM

## 2019-06-07 DIAGNOSIS — R062 Wheezing: Secondary | ICD-10-CM | POA: Diagnosis not present

## 2019-06-07 DIAGNOSIS — G4733 Obstructive sleep apnea (adult) (pediatric): Secondary | ICD-10-CM | POA: Diagnosis not present

## 2019-06-07 DIAGNOSIS — R197 Diarrhea, unspecified: Secondary | ICD-10-CM | POA: Diagnosis not present

## 2019-06-07 DIAGNOSIS — R0602 Shortness of breath: Secondary | ICD-10-CM | POA: Diagnosis not present

## 2019-06-07 LAB — HEMOGLOBIN A1C
Hgb A1c MFr Bld: 11.7 % — ABNORMAL HIGH (ref 4.8–5.6)
Mean Plasma Glucose: 289.09 mg/dL

## 2019-06-07 LAB — COMPREHENSIVE METABOLIC PANEL
ALT: 43 U/L (ref 0–44)
AST: 46 U/L — ABNORMAL HIGH (ref 15–41)
Albumin: 3.7 g/dL (ref 3.5–5.0)
Alkaline Phosphatase: 53 U/L (ref 38–126)
Anion gap: 11 (ref 5–15)
BUN: 19 mg/dL (ref 8–23)
CO2: 24 mmol/L (ref 22–32)
Calcium: 9.1 mg/dL (ref 8.9–10.3)
Chloride: 99 mmol/L (ref 98–111)
Creatinine, Ser: 1.16 mg/dL (ref 0.61–1.24)
GFR calc Af Amer: >60 mL/min (ref >60)
GFR calc non Af Amer: >60 mL/min (ref >60)
Glucose, Bld: 290 mg/dL — ABNORMAL HIGH (ref 70–99)
Potassium: 4.1 mmol/L (ref 3.5–5.1)
Sodium: 134 mmol/L — ABNORMAL LOW (ref 135–145)
Total Bilirubin: 0.6 mg/dL (ref 0.3–1.2)
Total Protein: 6.8 g/dL (ref 6.5–8.1)

## 2019-06-07 LAB — FERRITIN: Ferritin: 771 ng/mL — ABNORMAL HIGH (ref 24–336)

## 2019-06-07 LAB — CBC WITH DIFFERENTIAL/PLATELET
Abs Immature Granulocytes: 0.01 10*3/uL (ref 0.00–0.07)
Basophils Absolute: 0 10*3/uL (ref 0.0–0.1)
Basophils Relative: 1 %
Eosinophils Absolute: 0 10*3/uL (ref 0.0–0.5)
Eosinophils Relative: 1 %
HCT: 48.1 % (ref 39.0–52.0)
Hemoglobin: 16.6 g/dL (ref 13.0–17.0)
Immature Granulocytes: 0 %
Lymphocytes Relative: 28 %
Lymphs Abs: 1 10*3/uL (ref 0.7–4.0)
MCH: 31.7 pg (ref 26.0–34.0)
MCHC: 34.5 g/dL (ref 30.0–36.0)
MCV: 91.8 fL (ref 80.0–100.0)
Monocytes Absolute: 0.4 10*3/uL (ref 0.1–1.0)
Monocytes Relative: 11 %
Neutro Abs: 2.2 10*3/uL (ref 1.7–7.7)
Neutrophils Relative %: 59 %
Platelets: 83 10*3/uL — ABNORMAL LOW (ref 150–400)
RBC: 5.24 MIL/uL (ref 4.22–5.81)
RDW: 13.5 % (ref 11.5–15.5)
WBC: 3.6 10*3/uL — ABNORMAL LOW (ref 4.0–10.5)
nRBC: 0 % (ref 0.0–0.2)

## 2019-06-07 LAB — PROCALCITONIN: Procalcitonin: <0.1 ng/mL

## 2019-06-07 LAB — LACTIC ACID, PLASMA
Lactic Acid, Venous: 2.1 mmol/L (ref 0.5–1.9)
Lactic Acid, Venous: 1.2 mmol/L (ref 0.5–1.9)

## 2019-06-07 LAB — PROTIME-INR
INR: 1.1 (ref 0.8–1.2)
Prothrombin Time: 13.7 seconds (ref 11.4–15.2)

## 2019-06-07 LAB — LACTATE DEHYDROGENASE: LDH: 212 U/L — ABNORMAL HIGH (ref 98–192)

## 2019-06-07 LAB — D-DIMER, QUANTITATIVE: D-Dimer, Quant: 0.37 ug/mL-FEU (ref 0.00–0.50)

## 2019-06-07 LAB — CK: Total CK: 49 U/L (ref 49–397)

## 2019-06-07 LAB — GLUCOSE, CAPILLARY: Glucose-Capillary: 220 mg/dL — ABNORMAL HIGH (ref 70–99)

## 2019-06-07 LAB — C-REACTIVE PROTEIN: CRP: 3.3 mg/dL — ABNORMAL HIGH (ref <1.0)

## 2019-06-07 LAB — APTT: aPTT: 28 seconds (ref 24–36)

## 2019-06-07 LAB — SARS CORONAVIRUS 2 BY RT PCR (HOSPITAL ORDER, PERFORMED IN ~~LOC~~ HOSPITAL LAB): SARS Coronavirus 2: POSITIVE — AB

## 2019-06-07 MED ORDER — SODIUM CHLORIDE 0.9 % IV SOLN
500.0000 mg | INTRAVENOUS | Status: DC
  Administered 2019-06-07: 500 mg via INTRAVENOUS
  Filled 2019-06-07: qty 500

## 2019-06-07 MED ORDER — LOSARTAN POTASSIUM-HCTZ 100-25 MG PO TABS: 1.0000 | ORAL_TABLET | Freq: Every day | ORAL | Status: DC

## 2019-06-07 MED ORDER — ROSUVASTATIN CALCIUM 20 MG PO TABS
20.0000 mg | ORAL_TABLET | Freq: Every day | ORAL | Status: DC
  Administered 2019-06-07 – 2019-06-12 (×6): 20 mg via ORAL
  Filled 2019-06-07 (×6): qty 1

## 2019-06-07 MED ORDER — SODIUM CHLORIDE 0.9 % IV SOLN
2.0000 g | INTRAVENOUS | Status: DC
  Administered 2019-06-07: 2 g via INTRAVENOUS
  Filled 2019-06-07: qty 20

## 2019-06-07 MED ORDER — METHYLPREDNISOLONE SODIUM SUCC 125 MG IJ SOLR
60.0000 mg | Freq: Two times a day (BID) | INTRAMUSCULAR | Status: DC
  Administered 2019-06-07 – 2019-06-08 (×2): 60 mg via INTRAVENOUS
  Filled 2019-06-07 (×2): qty 2

## 2019-06-07 MED ORDER — ZINC SULFATE 220 (50 ZN) MG PO CAPS
220.0000 mg | ORAL_CAPSULE | Freq: Every day | ORAL | Status: DC
  Administered 2019-06-07 – 2019-06-12 (×6): 220 mg via ORAL
  Filled 2019-06-07 (×6): qty 1

## 2019-06-07 MED ORDER — AMLODIPINE BESYLATE 5 MG PO TABS
10.0000 mg | ORAL_TABLET | Freq: Every day | ORAL | Status: DC
  Administered 2019-06-08 – 2019-06-12 (×5): 10 mg via ORAL
  Filled 2019-06-07 (×6): qty 2

## 2019-06-07 MED ORDER — PROPRANOLOL HCL ER 120 MG PO CP24
240.0000 mg | ORAL_CAPSULE | Freq: Every day | ORAL | Status: DC
  Administered 2019-06-07 – 2019-06-08 (×2): 240 mg via ORAL
  Filled 2019-06-07 (×3): qty 2

## 2019-06-07 MED ORDER — ASPIRIN 81 MG PO CHEW
81.0000 mg | CHEWABLE_TABLET | Freq: Every day | ORAL | Status: DC
  Administered 2019-06-07 – 2019-06-12 (×6): 81 mg via ORAL
  Filled 2019-06-07 (×6): qty 1

## 2019-06-07 MED ORDER — INSULIN GLARGINE 100 UNIT/ML ~~LOC~~ SOLN
10.0000 [IU] | Freq: Every day | SUBCUTANEOUS | Status: DC
  Administered 2019-06-07 – 2019-06-09 (×3): 10 [IU] via SUBCUTANEOUS
  Filled 2019-06-07 (×3): qty 0.1

## 2019-06-07 MED ORDER — ACETAMINOPHEN 325 MG PO TABS
650.0000 mg | ORAL_TABLET | Freq: Four times a day (QID) | ORAL | Status: DC | PRN
  Administered 2019-06-07: 650 mg via ORAL
  Filled 2019-06-07: qty 2

## 2019-06-07 MED ORDER — PRIMIDONE 50 MG PO TABS
50.0000 mg | ORAL_TABLET | Freq: Three times a day (TID) | ORAL | Status: DC
  Administered 2019-06-07 – 2019-06-12 (×15): 50 mg via ORAL
  Filled 2019-06-07 (×18): qty 1

## 2019-06-07 MED ORDER — SODIUM CHLORIDE 0.9 % IV SOLN
1000.0000 mL | INTRAVENOUS | Status: DC
  Administered 2019-06-07: 1000 mL via INTRAVENOUS

## 2019-06-07 MED ORDER — HYDROCHLOROTHIAZIDE 25 MG PO TABS
25.0000 mg | ORAL_TABLET | Freq: Every day | ORAL | Status: DC
  Administered 2019-06-08 – 2019-06-12 (×5): 25 mg via ORAL
  Filled 2019-06-07 (×6): qty 1

## 2019-06-07 MED ORDER — LOSARTAN POTASSIUM 25 MG PO TABS
100.0000 mg | ORAL_TABLET | Freq: Every day | ORAL | Status: DC
  Administered 2019-06-08 – 2019-06-12 (×5): 100 mg via ORAL
  Filled 2019-06-07 (×6): qty 4

## 2019-06-07 MED ORDER — ALBUTEROL SULFATE HFA 108 (90 BASE) MCG/ACT IN AERS
2.0000 | INHALATION_SPRAY | RESPIRATORY_TRACT | Status: DC | PRN
  Filled 2019-06-07: qty 6.7

## 2019-06-07 MED ORDER — ACETAMINOPHEN 650 MG RE SUPP: 650.0000 mg | Freq: Four times a day (QID) | RECTAL | Status: DC | PRN

## 2019-06-07 MED ORDER — PANTOPRAZOLE SODIUM 40 MG PO TBEC
40.0000 mg | DELAYED_RELEASE_TABLET | Freq: Every day | ORAL | Status: DC
  Administered 2019-06-07 – 2019-06-12 (×6): 40 mg via ORAL
  Filled 2019-06-07 (×6): qty 1

## 2019-06-07 MED ORDER — SODIUM CHLORIDE 0.9 % IV SOLN
200.0000 mg | Freq: Once | INTRAVENOUS | Status: AC
  Administered 2019-06-07: 200 mg via INTRAVENOUS
  Filled 2019-06-07: qty 40

## 2019-06-07 MED ORDER — INSULIN ASPART 100 UNIT/ML ~~LOC~~ SOLN
0.0000 [IU] | Freq: Three times a day (TID) | SUBCUTANEOUS | Status: DC
  Administered 2019-06-08 (×3): 20 [IU] via SUBCUTANEOUS
  Administered 2019-06-09: 4 [IU] via SUBCUTANEOUS
  Administered 2019-06-09: 11 [IU] via SUBCUTANEOUS
  Administered 2019-06-09: 3 [IU] via SUBCUTANEOUS
  Administered 2019-06-10 (×2): 11 [IU] via SUBCUTANEOUS
  Administered 2019-06-10 – 2019-06-11 (×3): 20 [IU] via SUBCUTANEOUS
  Administered 2019-06-11: 15 [IU] via SUBCUTANEOUS
  Administered 2019-06-12: 09:00:00 11 [IU] via SUBCUTANEOUS
  Administered 2019-06-12: 12:00:00 20 [IU] via SUBCUTANEOUS

## 2019-06-07 MED ORDER — ONDANSETRON HCL 4 MG PO TABS: 4.0000 mg | ORAL_TABLET | Freq: Four times a day (QID) | ORAL | Status: DC | PRN

## 2019-06-07 MED ORDER — VITAMIN C 500 MG PO TABS
1000.0000 mg | ORAL_TABLET | Freq: Every day | ORAL | Status: DC
  Administered 2019-06-07 – 2019-06-12 (×6): 1000 mg via ORAL
  Filled 2019-06-07 (×6): qty 2

## 2019-06-07 MED ORDER — ACETAMINOPHEN 500 MG PO TABS
1000.0000 mg | ORAL_TABLET | Freq: Once | ORAL | Status: AC
  Administered 2019-06-07: 1000 mg via ORAL
  Filled 2019-06-07: qty 2

## 2019-06-07 MED ORDER — EZETIMIBE 10 MG PO TABS
10.0000 mg | ORAL_TABLET | Freq: Every day | ORAL | Status: DC
  Administered 2019-06-07 – 2019-06-12 (×6): 10 mg via ORAL
  Filled 2019-06-07 (×7): qty 1

## 2019-06-07 MED ORDER — ONDANSETRON HCL 4 MG/2ML IJ SOLN: 4.0000 mg | Freq: Four times a day (QID) | INTRAMUSCULAR | Status: DC | PRN

## 2019-06-07 MED ORDER — POLYETHYLENE GLYCOL 3350 17 G PO PACK: 17.0000 g | PACK | Freq: Every day | ORAL | Status: DC | PRN

## 2019-06-07 MED ORDER — BUPROPION HCL ER (XL) 300 MG PO TB24
300.0000 mg | ORAL_TABLET | Freq: Every day | ORAL | Status: DC
  Administered 2019-06-07 – 2019-06-12 (×6): 300 mg via ORAL
  Filled 2019-06-07 (×7): qty 1

## 2019-06-07 MED ORDER — SODIUM CHLORIDE 0.9 % IV SOLN
100.0000 mg | INTRAVENOUS | Status: DC
  Administered 2019-06-08: 100 mg via INTRAVENOUS
  Filled 2019-06-07 (×2): qty 20

## 2019-06-07 NOTE — Progress Notes (Signed)
Notified bedside nurse of need to draw lactic acid.  

## 2019-06-07 NOTE — ED Notes (Signed)
Temp 101, edp aware and tylenol given.  Pt stood up at bedside and attempted to give urine sample but was not able at this time.

## 2019-06-07 NOTE — ED Notes (Signed)
CRITICAL VALUE ALERT  Critical Value:  Lactic Acid 2.1  Date & Time Notied: 06/07/2019  Provider Notified: Dr Sabra Heck  Orders Received/Actions taken: see new order.

## 2019-06-07 NOTE — ED Triage Notes (Signed)
EMS says pt started having fever 99 yesterday, generalized weakness. Today feels worse.  Reports cough, diarrhea, wheezing.  O2 sat 85% on room air.  Reports dry cough.  Temp today was 103.1 per ems.  CBG 325.  Reports hasn't had any of his meds today.  Pt says so weak today he can't walk.  Pt says had covid test in [place] yesterday but no results yet.    Pt alert and oriented.  Denies pain.  Pt says his wife also was tested yesterday.

## 2019-06-07 NOTE — H&P (Addendum)
History and Physical    [name] [name] KZL:[id number] DOB: October 08, 1951 DOA: 06/07/2019  PCP: [name], MD   Patient coming from: Home  I have personally briefly reviewed patient's old medical records in [place]  Chief Complaint: Cough, diarrhea  HPI: [name] [name] is a 68 y.o. male with medical history significant for coronary artery disease, hypertension, obstructive sleep apnea, aortic stenosis, diabetes mellitus, tremor, patient presented to the ED with complaints of cough and wheezing over the past week, and diarrhea.  Patient denies difficulty breathing, and his diarrhea has significantly improved..  Patient's wife is also ill with similar symptoms.  Patient had COVID testing done yesterday he is awaiting result.  Temperature up to 104 recorded today at home.  EMS reports O2 sats 85% on room air on arrival.  ED Course: Per ED provider temperature of 103 recorded in ED, heart rate 70s to 80s,  intermittent tachypnea to 28, O2 sats on room air greater than 90%.  Systolic blood pressure 390Z to 140s.  WBC 3.6.  Mild elevated AST 46.  Mild lactic acidosis 2.1 >> 1.2.  Inflammatory markers checked mildly elevated CRP, LDH and ferritin.  But d-dimer within normal limits 0.37.  SARS-CoV-2 test positive.  Portable chest x-ray shows left lower lobe opacity.  Patient initially started on IV ceftriaxone and azithromycin for localized pneumonia.  Hospitalist to admit for COVID pneumonia.  Review of Systems: As per HPI all other systems reviewed and negative.  Past Medical History:  Diagnosis Date   Coronary artery disease    s/p PCI of LAD w Residual 50-60% RCA   Diabetes ([place])    Dilated aortic root (HCC)    22mm by echo 01/2018   High cholesterol    History of echocardiogram 06/2011   Normal LVF,mild LVH,trivial TR/MR, mild AS/AI   History of kidney stones    Hypertension    Mild aortic stenosis    mean AV gradient 66mmHg by echo 02/2016   Tremor     Past  Surgical History:  Procedure Laterality Date   arm surgery Right    stents - placement     x 2     reports that he has never smoked. He has never used smokeless tobacco. He reports current alcohol use. He reports that he does not use drugs.  Allergies  Allergen Reactions   Codeine Other (See Comments)    Vertigo and upset stomach    Family History  Problem Relation Age of Onset   Other Father        unsure of history   CAD Brother    Heart failure Mother    Emphysema Mother    CAD Mother     Prior to Admission medications   Medication Sig Start Date End Date Taking? Authorizing Provider  allopurinol (ZYLOPRIM) 300 MG tablet Take 1 tablet (300 mg total) by mouth daily. 03/10/15  Yes Turner, Eber Hong, MD  amLODipine (NORVASC) 10 MG tablet Take 1 tablet (10 mg total) by mouth daily. 02/28/17  Yes Sueanne Margarita, MD  aspirin 81 MG tablet Take 81 mg by mouth daily.   Yes [provider]  buPROPion (WELLBUTRIN XL) 300 MG 24 hr tablet Take 300 mg by mouth daily.   Yes [provider]  Cholecalciferol (VITAMIN D) 125 MCG (5000 UT) CAPS Take 1 tablet by mouth daily.   Yes [provider]  ezetimibe (ZETIA) 10 MG tablet TAKE 1 TABLET BY MOUTH DAILY. PLEASE MAKE  OVERDUE APPT WITH DR. Radford Pax BEFORE ANYMORE REFILLS 02/10/19  Yes Turner, Eber Hong, MD  indomethacin (INDOCIN) 50 MG capsule Take 50 mg as needed by mouth (w/food - take as directed).    Yes [provider]  losartan-hydrochlorothiazide (HYZAAR) 100-25 MG tablet Take 1 tablet daily by mouth. 09/26/17  Yes Daune Perch, NP  metFORMIN (GLUCOPHAGE) 500 MG tablet Take 500 mg daily by mouth. 09/14/17  Yes [provider]  nitroGLYCERIN (NITROSTAT) 0.4 MG SL tablet Place 1 tablet (0.4 mg total) under the tongue every 5 (five) minutes as needed for chest pain. 05/09/17  Yes Turner, Eber Hong, MD  primidone (MYSOLINE) 50 MG tablet Take 1 tablet (50 mg total) by mouth 3 (three) times daily.  07/04/18  Yes Marcial Pacas, MD  propranolol ER (INDERAL LA) 120 MG 24 hr capsule Take 2 capsules (240 mg total) by mouth daily. 07/04/18  Yes Marcial Pacas, MD  rosuvastatin (CRESTOR) 20 MG tablet Take 1 tablet (20 mg total) by mouth daily. 04/17/18  Yes Simmons, Brittainy M, PA-C  pantoprazole (PROTONIX) 40 MG tablet Take 1 tablet by mouth daily. 03/04/19   [provider]    Physical Exam: Vitals:   06/07/19 1304 06/07/19 1447 06/07/19 1530 06/07/19 1600  BP:  134/80 123/74 126/84  Pulse:  76 73 72  Resp: (!) 22 20 19  (!) 22  Temp:  99 F (37.2 C)    TempSrc:  Oral    SpO2: 93% 95% 91% 92%  Weight:      Height:        Constitutional: Appears flushed, calm, comfortable Vitals:   06/07/19 1304 06/07/19 1447 06/07/19 1530 06/07/19 1600  BP:  134/80 123/74 126/84  Pulse:  76 73 72  Resp: (!) 22 20 19  (!) 22  Temp:  99 F (37.2 C)    TempSrc:  Oral    SpO2: 93% 95% 91% 92%  Weight:      Height:       Eyes: PERRL, lids and conjunctivae normal ENMT: Mucous membranes are moist. Posterior pharynx clear of any exudate or lesions. Neck: normal, supple, no masses, no thyromegaly Respiratory: clear to auscultation bilaterally, no wheezing, no crackles. Normal respiratory effort. No accessory muscle use.  Cardiovascular: Regular rate and rhythm, no murmurs / rubs / gallops. No extremity edema. 2+ pedal pulses.   Abdomen: no tenderness, no masses palpated. No hepatosplenomegaly. Bowel sounds positive.  Musculoskeletal: no clubbing / cyanosis. No joint deformity upper and lower extremities. Good ROM, no contractures. Normal muscle tone.  Skin: no rashes, lesions, ulcers. No induration Neurologic: CN 2-12 grossly intact.  Strength 5/5 in all 4.  Psychiatric: Normal judgment and insight. Alert and oriented x 3. Normal mood.   Labs on Admission: I have personally reviewed following labs and imaging studies  CBC: Recent Labs  Lab 06/07/19 1220  WBC 3.6*  NEUTROABS 2.2  HGB 16.6    HCT 48.1  MCV 91.8  PLT 83*   Basic Metabolic Panel: Recent Labs  Lab 06/07/19 1220  NA 134*  K 4.1  CL 99  CO2 24  GLUCOSE 290*  BUN 19  CREATININE 1.16  CALCIUM 9.1   Liver Function Tests: Recent Labs  Lab 06/07/19 1220  AST 46*  ALT 43  ALKPHOS 53  BILITOT 0.6  PROT 6.8  ALBUMIN 3.7   Coagulation Profile: Recent Labs  Lab 06/07/19 1220  INR 1.1   Cardiac Enzymes: Recent Labs  Lab 06/07/19 1220  CKTOTAL 49  Anemia Panel: Recent Labs    06/07/19 1220  FERRITIN 771*    Radiological Exams on Admission: Dg Chest Port 1 View  Result Date: 06/07/2019 CLINICAL DATA:  Cough, shortness of breath EXAM: PORTABLE CHEST 1 VIEW COMPARISON:  03/23/2010 FINDINGS: The heart size and mediastinal contours are within normal limits. Relatively low volume AP portable examination with heterogeneous opacity of the left lung base. The visualized skeletal structures are unremarkable. IMPRESSION: Relatively low volume AP portable examination with heterogeneous opacity of the left lung base, concerning for infection or aspiration. PA and lateral radiographs may be helpful to more clearly evaluate. Electronically Signed   By: Eddie Candle M.D.   On: 06/07/2019 13:07    EKG: Independently reviewed.  Read as atrial fibrillation. But P waves are present and rate is regular.  QTc 498.  Assessment/Plan Active Problems:   Viral pneumonia   COVID-19 infection pneumonia-cough, reported hypoxia O2 sats 85% on room air prior to arrival at this time O2 sats are greater than 90% on room air, febrile, with chest x-ray finding of lower lobe pneumonia.  Mild lactic acidosis 2.1 >> 1.2 without intervention.  Spouse with similar symptoms.  Leukopenia WBC 3.6, with mild elevated inflammatory markers CRP 3.3, LDH 212, ferritin 771.  Normal d-dimer 3.37.  Unremarkable pro calcitonin. -DC IV antibiotics at this time. -Contact pharmacy to dose steroids and Remdesmivir. -Trend inflammatory markers  daily -Mild elevated AST 46, trend LFTs daily  -Covid pneumonia protocol, proning -Zinc and vitamin C daily -Albuterol inhaler as needed -Follow-up blood culture results -Follow-up urine cultures -Addendum- patient arrived at [facility], patient at this time is not hypoxic with O2 sats 94% on room air. Patient not meeting criteria at this time for remdesmivir. If and when he becomes hypoxic it will be instituted.   Thrombocytopenia-platelets 83.  Likely secondary to viral infection.  Recent baseline ~130s-140s. -SCDs for now  Diabetes mellitus-random glucose 296.  Home medications Metformin.  Anticipate hyperglycemia with steroids. -Hemoglobin A1c a.m. - SSI- R -Hold home metformin - Will start Lantus 10u QHS to mitigate steroid effect.  Hypertension-stable. -Continue home losartan HCTZ, propranolol, Norvasc  Coronary artery disease -stable.  Denies chest pain.  EKG read as atrial fibrillation but actually P waves are present and rate is regular. -Repeat EKG -Continue home statins, zetia, aspirin.  Tremors -Continue home primidone, propranolol   DVT prophylaxis: SCDS for now Code Status: Full Family Communication: None at bedside Disposition Plan: Per rounding team Consults called: None Admission status: Inpatient, I certify that at the point of admission it is my clinical judgment that the patient will require inpatient hospital care spanning beyond 2 midnights from the point of admission due to high intensity of service, high risk for further deterioration and high frequency of surveillance required. The following factors support the patient status of inpatient: COVID-19 pneumonia, mild hypoxia, with chest x-ray findings requiring steroids are in place medically at this time.   Bethena Roys MD [facility]  06/07/2019, 5:42 PM

## 2019-06-07 NOTE — Progress Notes (Signed)
MEDICATION RELATED CONSULT NOTE - INITIAL   Pharmacy Consult for Remdesivir Indication: COVID-19  Assessment: 68 yo M presents with cough and diarrhea. Found to have COVID-19. Admitted earlier on 7/18 and now sats dropping below 94% and placing on [place]. CXR does show PNA. ALT wnl.  Plan:  Give remdesivir 200mg  IV x 1, then start 100mg  IV x 4 days Monitor clinical progress and ALT  Elenor Quinones, PharmD, BCPS, BCIDP Clinical Pharmacist 06/07/2019 10:35 PM

## 2019-06-07 NOTE — ED Provider Notes (Signed)
[facility] EMERGENCY DEPARTMENT Provider Note   CSN: [id number] Arrival date & time: 06/07/19  1154     History   Chief Complaint Chief Complaint  Patient presents with  . Diarrhea    HPI [name] [name] is a 68 y.o. male.     HPI  The patient is a 68 year old male, he has a known history of diabetes, coronary disease status post percutaneous coronary intervention of the left anterior descending, history of kidney stones hypertension and essential tremor.  He reports having recurrent bouts with bronchitis in the past.  He states that over the last week he has had some progressive symptoms including coughing, shortness of breath and some progressive weakness.  He has also noted having a fever which has become up to 104.1 today based on the paramedics evaluation.  He has been measuring them as high as 103.  He has had formal COVID testing but is unsure of the results, he was told to come to the hospital things got worse.  His wife has similar symptoms, the patient does report coughing and feels short of breath only when he coughs.  Past Medical History:  Diagnosis Date  . Coronary artery disease    s/p PCI of LAD w Residual 50-60% RCA  . Diabetes ([place])   . Dilated aortic root (HCC)    15mm by echo 01/2018  . High cholesterol   . History of echocardiogram 06/2011   Normal LVF,mild LVH,trivial TR/MR, mild AS/AI  . History of kidney stones   . Hypertension   . Mild aortic stenosis    mean AV gradient 40mmHg by echo 02/2016  . Tremor     Patient Active Problem List   Diagnosis Date Noted  . Essential tremor 07/04/2018  . Obstructive sleep apnea 07/04/2018  . Dilated aortic root ([place])   . Tremor 09/27/2017  . Ascending aorta dilatation (HCC) 02/16/2017  . Mild aortic stenosis   . Coronary atherosclerosis of native coronary artery 12/30/2013  . Essential hypertension, benign 12/30/2013  . Pure hypercholesterolemia 12/30/2013  . Encounter for long-term (current) use of other  medications 12/30/2013    Past Surgical History:  Procedure Laterality Date  . arm surgery Right   . stents - placement     x 2        Home Medications    Prior to Admission medications   Medication Sig Start Date End Date Taking? Authorizing Provider  allopurinol (ZYLOPRIM) 300 MG tablet Take 1 tablet (300 mg total) by mouth daily. 03/10/15   Sueanne Margarita, MD  amLODipine (NORVASC) 10 MG tablet Take 1 tablet (10 mg total) by mouth daily. 02/28/17   Sueanne Margarita, MD  aspirin 81 MG tablet Take 81 mg by mouth daily.    [provider]  buPROPion (WELLBUTRIN XL) 300 MG 24 hr tablet Take 300 mg by mouth daily.    [provider]  ezetimibe (ZETIA) 10 MG tablet TAKE 1 TABLET BY MOUTH DAILY. PLEASE MAKE OVERDUE APPT WITH DR. Radford Pax BEFORE ANYMORE REFILLS 02/10/19   Sueanne Margarita, MD  indomethacin (INDOCIN) 50 MG capsule Take 50 mg as needed by mouth (w/food - take as directed).     [provider]  losartan-hydrochlorothiazide (HYZAAR) 100-25 MG tablet Take 1 tablet daily by mouth. 09/26/17   Daune Perch, NP  metFORMIN (GLUCOPHAGE) 500 MG tablet Take 500 mg daily by mouth. 09/14/17   [provider]  nitroGLYCERIN (NITROSTAT) 0.4 MG SL tablet Place 1 tablet (0.4  mg total) under the tongue every 5 (five) minutes as needed for chest pain. 05/09/17   Sueanne Margarita, MD  primidone (MYSOLINE) 50 MG tablet Take 1 tablet (50 mg total) by mouth 3 (three) times daily. 07/04/18   Marcial Pacas, MD  propranolol ER (INDERAL LA) 120 MG 24 hr capsule Take 2 capsules (240 mg total) by mouth daily. 07/04/18   Marcial Pacas, MD  rosuvastatin (CRESTOR) 20 MG tablet Take 1 tablet (20 mg total) by mouth daily. 04/17/18   Consuelo Pandy, PA-C    Family History Family History  Problem Relation Age of Onset  . Other Father        unsure of history  . CAD Brother   . Heart failure Mother   . Emphysema Mother   . CAD Mother     Social History Social History    Tobacco Use  . Smoking status: Never Smoker  . Smokeless tobacco: Never Used  Substance Use Topics  . Alcohol use: Yes    Comment: socially  . Drug use: No     Allergies   Codeine   Review of Systems Review of Systems  Constitutional: Positive for fever.  HENT: Negative for sore throat.   Eyes: Negative for pain and redness.  Respiratory: Positive for cough and shortness of breath.   Cardiovascular: Negative for chest pain.  Gastrointestinal: Negative for abdominal pain, nausea and vomiting.  Genitourinary: Negative for flank pain and hematuria.  Musculoskeletal: Negative for myalgias and neck pain.  Skin: Negative for rash.  Neurological: Positive for tremors and weakness. Negative for syncope and headaches.  Hematological: Negative for adenopathy. Does not bruise/bleed easily.  All other systems reviewed and are negative.    Physical Exam Updated Vital Signs Ht 1.727 m (5\' 8" )   Wt 72.6 kg   SpO2 95% Comment: 4 liters  BMI 24.33 kg/m   Physical Exam Vitals signs and nursing note reviewed.  Constitutional:      General: He is not in acute distress.    Appearance: He is well-developed.  HENT:     Head: Normocephalic and atraumatic.     Mouth/Throat:     Mouth: Mucous membranes are dry.     Pharynx: No oropharyngeal exudate.  Eyes:     General: No scleral icterus.       Right eye: No discharge.        Left eye: No discharge.     Conjunctiva/sclera: Conjunctivae normal.     Pupils: Pupils are equal, round, and reactive to light.  Neck:     Musculoskeletal: Normal range of motion and neck supple.     Thyroid: No thyromegaly.     Vascular: No JVD.  Cardiovascular:     Rate and Rhythm: Normal rate and regular rhythm.     Heart sounds: Normal heart sounds. No murmur. No friction rub. No gallop.   Pulmonary:     Effort: Respiratory distress present.     Breath sounds: Normal breath sounds. No wheezing or rales.     Comments: Slight tachypnea but speaks in  full sentences, no significant wheezing or rales, no accessory muscle use Abdominal:     General: Bowel sounds are normal. There is no distension.     Palpations: Abdomen is soft. There is no mass.     Tenderness: There is no abdominal tenderness.  Musculoskeletal: Normal range of motion.        General: No tenderness.  Lymphadenopathy:     Cervical: No  cervical adenopathy.  Skin:    General: Skin is warm and dry.     Findings: No erythema or rash.  Neurological:     Mental Status: He is alert.     Coordination: Coordination normal.     Comments: Essential tremor characterized by arm shaking continuously.  The patient answers my questions appropriately, no facial droop, clear speech.  Psychiatric:        Behavior: Behavior normal.      ED Treatments / Results  Labs (all labs ordered are listed, but only abnormal results are displayed) Labs Reviewed  SARS CORONAVIRUS 2 (HOSPITAL ORDER, [place] [facility]) - Abnormal; Notable for the following components:      Result Value   SARS Coronavirus 2 POSITIVE (*)    All other components within normal limits  LACTIC ACID, PLASMA - Abnormal; Notable for the following components:   Lactic Acid, Venous 2.1 (*)    All other components within normal limits  COMPREHENSIVE METABOLIC PANEL - Abnormal; Notable for the following components:   Sodium 134 (*)    Glucose, Bld 290 (*)    AST 46 (*)    All other components within normal limits  CBC WITH DIFFERENTIAL/PLATELET - Abnormal; Notable for the following components:   WBC 3.6 (*)    Platelets 83 (*)    All other components within normal limits  FERRITIN - Abnormal; Notable for the following components:   Ferritin 771 (*)    All other components within normal limits  LACTATE DEHYDROGENASE - Abnormal; Notable for the following components:   LDH 212 (*)    All other components within normal limits  C-REACTIVE PROTEIN - Abnormal; Notable for the following components:    CRP 3.3 (*)    All other components within normal limits  CULTURE, BLOOD (ROUTINE X 2)  CULTURE, BLOOD (ROUTINE X 2)  URINE CULTURE  APTT  PROTIME-INR  D-DIMER, QUANTITATIVE (NOT AT [facility])  PROCALCITONIN  CK  LACTIC ACID, PLASMA  URINALYSIS, ROUTINE W REFLEX MICROSCOPIC    EKG EKG Interpretation  Date/Time:  Saturday June 07 2019 12:14:41 EDT Ventricular Rate:  86 PR Interval:    QRS Duration: 89 QT Interval:  416 QTC Calculation: 498 R Axis:   -37 Text Interpretation:  Atrial fibrillation Left axis deviation Borderline prolonged QT interval Since last tracing rate faster Confirmed by Noemi Chapel [phone]) on 06/07/2019 12:31:00 PM   Radiology Dg Chest Port 1 View  Result Date: 06/07/2019 CLINICAL DATA:  Cough, shortness of breath EXAM: PORTABLE CHEST 1 VIEW COMPARISON:  03/23/2010 FINDINGS: The heart size and mediastinal contours are within normal limits. Relatively low volume AP portable examination with heterogeneous opacity of the left lung base. The visualized skeletal structures are unremarkable. IMPRESSION: Relatively low volume AP portable examination with heterogeneous opacity of the left lung base, concerning for infection or aspiration. PA and lateral radiographs may be helpful to more clearly evaluate. Electronically Signed   By: Eddie Candle M.D.   On: 06/07/2019 13:07    Procedures .Critical Care Performed by: Noemi Chapel, MD Authorized by: Noemi Chapel, MD   Critical care provider statement:    Critical care time (minutes):  35   Critical care time was exclusive of:  Separately billable procedures and treating other patients and teaching time   Critical care was necessary to treat or prevent imminent or life-threatening deterioration of the following conditions:  Respiratory failure   Critical care was time spent personally by me on the  following activities:  Blood draw for specimens, development of treatment plan with patient or surrogate, discussions with  consultants, evaluation of patient's response to treatment, examination of patient, obtaining history from patient or surrogate, ordering and performing treatments and interventions, ordering and review of laboratory studies, ordering and review of radiographic studies, pulse oximetry, re-evaluation of patient's condition and review of old charts   (including critical care time)  Medications Ordered in ED Medications - No data to display   Initial Impression / Assessment and Plan / ED Course  I have reviewed the triage vital signs and the nursing notes.  Pertinent labs & imaging results that were available during my care of the patient were reviewed by me and considered in my medical decision making (see chart for details).        I do not see any signs of infection on the patient's exam especially since his lungs are mostly clear however given his significant fever at home with progressive cough I would suspect there is a pulmonary illness.  He will need to be evaluated from a sepsis standpoint but also for coronavirus.  Recheck temperature, antipyretics, fluids, x-ray.  Lab work reports that the patient has a lactic acid of 2.1, he has a normal metabolic panel except for hyperglycemia, as is the case for most cases of coronavirus pneumonia the patient has a white blood cell count that is low at 3600, his platelet count is low at 83,000 which is very unusual and much lower than his baseline which is closer to 150.  D-dimer is normal at 0.37, ferritin is elevated at 771, pro calcitonin is less than 0.1 and CK is 49.  The LDH is 212 and the CRP is 3.3.  His coronavirus test came back positive  I personally looked at the chest x-ray and my interpretation is that there is a left lower lobe apparent pneumonia at the base, the right side is hard to see as there is poor inspiratory effort.  Given the patient is hypoxic, febrile and has symptoms of what appears to be coronavirus he will need to be  admitted to the hospital and possibly transferred to the [place] unit.  He has been given coverage for bacterial type pneumonia though at this time it is unclear whether it is bacterial or viral.  Either way the patient is critically ill and requiring oxygen, fluids, antibiotics and in need of admission to the hospital.,  Critical care provided.  Hospitalist paged for admission  D/W Dr. Denton Brick who will admit - I appreciate her willingness to care for this patient  Final Clinical Impressions(s) / ED Diagnoses   Final diagnoses:  Pneumonia due to COVID-19 virus  Sepsis, due to unspecified organism, unspecified whether acute organ dysfunction present [facility])      Noemi Chapel, MD 06/07/19 [phone]

## 2019-06-07 NOTE — ED Notes (Signed)
CRITICAL VALUE ALERT  Critical Value:  SARS @ positive  Date & Time Notied:  06/07/2019 @ 1450  Provider Notified: Dr Sabra Heck  Orders Received/Actions taken: see new orders.

## 2019-06-08 LAB — CBC WITH DIFFERENTIAL/PLATELET
Abs Immature Granulocytes: 0.01 10*3/uL (ref 0.00–0.07)
Basophils Absolute: 0 10*3/uL (ref 0.0–0.1)
Basophils Relative: 1 %
Eosinophils Absolute: 0 10*3/uL (ref 0.0–0.5)
Eosinophils Relative: 0 %
HCT: 48.7 % (ref 39.0–52.0)
Hemoglobin: 16.5 g/dL (ref 13.0–17.0)
Immature Granulocytes: 0 %
Lymphocytes Relative: 28 %
Lymphs Abs: 1 10*3/uL (ref 0.7–4.0)
MCH: 32 pg (ref 26.0–34.0)
MCHC: 33.9 g/dL (ref 30.0–36.0)
MCV: 94.6 fL (ref 80.0–100.0)
Monocytes Absolute: 0.2 10*3/uL (ref 0.1–1.0)
Monocytes Relative: 5 %
Neutro Abs: 2.4 10*3/uL (ref 1.7–7.7)
Neutrophils Relative %: 66 %
Platelets: 75 10*3/uL — ABNORMAL LOW (ref 150–400)
RBC: 5.15 MIL/uL (ref 4.22–5.81)
RDW: 13.8 % (ref 11.5–15.5)
WBC: 3.6 10*3/uL — ABNORMAL LOW (ref 4.0–10.5)
nRBC: 0 % (ref 0.0–0.2)

## 2019-06-08 LAB — COMPREHENSIVE METABOLIC PANEL
ALT: 38 U/L (ref 0–44)
AST: 42 U/L — ABNORMAL HIGH (ref 15–41)
Albumin: 3.5 g/dL (ref 3.5–5.0)
Alkaline Phosphatase: 53 U/L (ref 38–126)
Anion gap: 13 (ref 5–15)
BUN: 19 mg/dL (ref 8–23)
CO2: 23 mmol/L (ref 22–32)
Calcium: 8.6 mg/dL — ABNORMAL LOW (ref 8.9–10.3)
Chloride: 101 mmol/L (ref 98–111)
Creatinine, Ser: 1.22 mg/dL (ref 0.61–1.24)
GFR calc Af Amer: >60 mL/min (ref >60)
GFR calc non Af Amer: >60 mL/min (ref >60)
Glucose, Bld: 358 mg/dL — ABNORMAL HIGH (ref 70–99)
Potassium: 3.5 mmol/L (ref 3.5–5.1)
Sodium: 137 mmol/L (ref 135–145)
Total Bilirubin: 0.3 mg/dL (ref 0.3–1.2)
Total Protein: 6.9 g/dL (ref 6.5–8.1)

## 2019-06-08 LAB — FERRITIN: Ferritin: 928 ng/mL — ABNORMAL HIGH (ref 24–336)

## 2019-06-08 LAB — GLUCOSE, CAPILLARY
Glucose-Capillary: 365 mg/dL — ABNORMAL HIGH (ref 70–99)
Glucose-Capillary: 426 mg/dL — ABNORMAL HIGH (ref 70–99)
Glucose-Capillary: 458 mg/dL — ABNORMAL HIGH (ref 70–99)
Glucose-Capillary: 473 mg/dL — ABNORMAL HIGH (ref 70–99)
Glucose-Capillary: 350 mg/dL — ABNORMAL HIGH (ref 70–99)

## 2019-06-08 LAB — PHOSPHORUS: Phosphorus: 3.2 mg/dL (ref 2.5–4.6)

## 2019-06-08 LAB — HIV ANTIBODY (ROUTINE TESTING W REFLEX): HIV Screen 4th Generation wRfx: NONREACTIVE

## 2019-06-08 LAB — LACTATE DEHYDROGENASE: LDH: 250 U/L — ABNORMAL HIGH (ref 98–192)

## 2019-06-08 LAB — MAGNESIUM: Magnesium: 1.7 mg/dL (ref 1.7–2.4)

## 2019-06-08 LAB — C-REACTIVE PROTEIN: CRP: 5.3 mg/dL — ABNORMAL HIGH (ref <1.0)

## 2019-06-08 MED ORDER — INSULIN ASPART 100 UNIT/ML ~~LOC~~ SOLN
10.0000 [IU] | Freq: Once | SUBCUTANEOUS | Status: AC
  Administered 2019-06-08: 20:00:00 10 [IU] via SUBCUTANEOUS

## 2019-06-08 MED ORDER — INSULIN ASPART 100 UNIT/ML ~~LOC~~ SOLN: 10.0000 [IU] | Freq: Once | SUBCUTANEOUS | Status: DC

## 2019-06-08 MED ORDER — INSULIN ASPART 100 UNIT/ML ~~LOC~~ SOLN
10.0000 [IU] | Freq: Once | SUBCUTANEOUS | Status: AC
  Administered 2019-06-08: 10 [IU] via SUBCUTANEOUS

## 2019-06-08 MED ORDER — PRO-STAT SUGAR FREE PO LIQD
30.0000 mL | Freq: Every day | ORAL | Status: DC
  Administered 2019-06-08 – 2019-06-12 (×5): 30 mL via ORAL
  Filled 2019-06-08 (×5): qty 30

## 2019-06-08 MED ORDER — ADULT MULTIVITAMIN W/MINERALS CH
1.0000 | ORAL_TABLET | Freq: Every day | ORAL | Status: DC
  Administered 2019-06-08 – 2019-06-12 (×5): 1 via ORAL
  Filled 2019-06-08 (×5): qty 1

## 2019-06-08 MED ORDER — ENSURE ENLIVE PO LIQD
237.0000 mL | ORAL | Status: DC
  Administered 2019-06-08 – 2019-06-10 (×3): 237 mL via ORAL

## 2019-06-08 MED ORDER — BOOST / RESOURCE BREEZE PO LIQD CUSTOM
1.0000 | Freq: Two times a day (BID) | ORAL | Status: DC
  Administered 2019-06-09: 237 mL via ORAL
  Administered 2019-06-09 – 2019-06-12 (×5): 1 via ORAL
  Filled 2019-06-08 (×10): qty 1

## 2019-06-08 NOTE — Progress Notes (Signed)
Initial Nutrition Assessment  RD working remotely.   DOCUMENTATION CODES:   Obesity unspecified  INTERVENTION:  - will order Boost Breeze BID, each supplement provides 250 kcal and 9 grams of protein. - will order Ensure Enlive once/day, each supplement provides 350 kcal and 20 grams of protein. - will order 30 mL Prostat once/day, each supplement provides 100 kcal and 15 grams of protein. - will order daily multivitamin with minerals. - continue to encourage PO intakes.    NUTRITION DIAGNOSIS:   Increased nutrient needs related to acute illness as evidenced by estimated needs.  GOAL:   Patient will meet greater than or equal to 90% of their needs  MONITOR:   PO intake, Supplement acceptance, Labs, Weight trends, I & O's  REASON FOR ASSESSMENT:   Malnutrition Screening Tool  ASSESSMENT:   68 y.o. male with medical history significant for CAD, HTN, OSA, aortic stenosis, DM, and tremor. He presented to the ED on 7/18 with complaints of cough, wheezing, and diarrhea over the past week. Patient's wife is also ill with similar symptoms. Patient had COVID testing done yesterday (7/17) as an outpatient; awaiting result. Temperature up to 104 recorded today (7/18) at home. EMS reported O2 sats 85% on room air on arrival.  Unable to reach patient via phone. Per RN flow sheet, patient consumed 10% of breakfast this AM. Per chart review, current weight is 213 lb and last recorded weight PTA was on 10/09/18 when he weighed 236 lb. This indicates 23 lb weight loss (9.7% body weight) in the past 8 months; not significant for time frame but unsure if weight loss occurred more acutely.   Noted CBG this AM. Will order ONS as outlined above as benefit of increased nutrition is important in light of acute illness, noted current insulin regimen.  Per notes: - possible d/c in 1-2 days - COVID PNA protocol including proning - hyperglycemia anticipated with steroid prescription - HgbA1c checked  yesterday and is 11.7%    Medications reviewed; sliding scale novolog, 10 units lantus/day, 25 mg hydrodiuril/day, 60 mg solu-medrol BID, 100 mg remdesivir/day x4 days starting 7/19, 200 mg remdesivir x1 dose 7/18, 1000 mg ascorbic acid/day, 220 mg zinc sulfate/day.  Labs reviewed; CBG: 365 mg/dl today, Ca: 8.6 mg/dl. HgbA1c 7/18: 11.7%    NUTRITION - FOCUSED PHYSICAL EXAM:  unable to complete at this time.   Diet Order:   Diet Order            Diet heart healthy/carb modified Room service appropriate? Yes; Fluid consistency: Thin  Diet effective now              EDUCATION NEEDS:   Not appropriate for education at this time  Skin:  Skin Assessment: Reviewed RN Assessment  Last BM:  7/19  Height:   Ht Readings from Last 1 Encounters:  06/07/19 5\' 10"  (1.778 m)    Weight:   Wt Readings from Last 1 Encounters:  06/07/19 96.6 kg    Ideal Body Weight:  75.4 kg  BMI:  Body mass index is 30.56 kg/m.  Estimated Nutritional Needs:   Kcal:  3662-9476 kcal  Protein:  115-125 grams  Fluid:  >/= 2.2 L/day      Jarome Matin, MS, RD, LDN, [facility] Inpatient Clinical Dietitian Pager # [phone] After hours/weekend pager # [phone]

## 2019-06-08 NOTE — Progress Notes (Signed)
CBG for 1700 was 426, MD notified, ordered to give 20 units of Novolog - and recheck in 1 hour. Novolog 20 units given

## 2019-06-08 NOTE — Plan of Care (Signed)

## 2019-06-08 NOTE — Progress Notes (Signed)
Pt's CBG was 426 at 1700 check, given 20 unit novolog.  Recheck at 1820 - it was 458.  MD notified, and orders received for additional 10 units to be given - orders taken and Novolog given to Pt. MD would like CBG in 1 hour and called with results, he will be in office until 8/830. Will pass along Night RN.

## 2019-06-08 NOTE — Progress Notes (Signed)
Phoned the Pt's wife, designated person.  Updated hers, that the Pt is feeling well, we will be getting him up walking in the hall today, to make sure he does not desat, and Pt possibly be discharge home in the 1-2 days.   Education provided on when the Pt returns home, and he will be quarantine until he cleared by the PCP, [place] [facility].  Pt's wife is waiting for test results. Advised that anyone coming into home needs to wear mask, she needs to wear a mask, and no children/ill person.   Advised that restrictions and guidelines will be on the Pt's discharge summary to go home,  Pt inquired about going back on meal assistance through [facility], advised that I would ask SW for her. Vinnie Level

## 2019-06-08 NOTE — Progress Notes (Signed)
Pt walked the unit on R/A, HR in the mid to upper 60's Oxygen saturation was between 93 to 100%, Pt c/o of minor weakness, denied shortness of breath.

## 2019-06-08 NOTE — Progress Notes (Signed)
Progress Note    AJAI HARVILLE OYD:[id number] DOB: 03-26-1951 DOA: 06/07/2019  PCP: [name], MD   Patient coming from: Home  I have personally briefly reviewed patient's old medical records in [place]  Chief Complaint: Cough, diarrhea  HPI: [name] [name] is a 68 y.o. male with medical history significant for coronary artery disease, hypertension, obstructive sleep apnea, aortic stenosis, diabetes mellitus, tremor, patient presented to the ED with complaints of cough and wheezing over the past week, and diarrhea.  Patient denies difficulty breathing, and his diarrhea has significantly improved..  Patient's wife is also ill with similar symptoms.  Patient had COVID testing done yesterday he is awaiting result.  Temperature up to 104 recorded today at home. EMS reports O2 sats 85% on room air on arrival. Per ED provider temperature of 103 recorded in ED, heart rate 70s to 80s,  intermittent tachypnea to 28, O2 sats on room air greater than 90%.  Systolic blood pressure 672C to 140s.  WBC 3.6.  Mild elevated AST 46.  Mild lactic acidosis 2.1 >> 1.2.  Inflammatory markers checked mildly elevated CRP, LDH and ferritin.  But d-dimer within normal limits 0.37.  SARS-CoV-2 test positive.  Portable chest x-ray shows left lower lobe opacity.  Patient initially started on IV ceftriaxone and azithromycin for localized pneumonia.  Hospitalist to admit for COVID pneumonia.  Assessment/Plan Active Problems:   Viral pneumonia   COVID-19 infection pneumonia without hypoxia, POA, ongoing- Reported hypoxia O2 sats 85% on room air prior to arrival  Chest x-ray finding of lower lobe pneumonia.   Procalcitonin negative - DC IV antibiotics at this time. Steroids be discontinued; will not offer remdesivir given patient remains without hypoxia  Zinc and vitamin C daily Recent Labs    06/07/19 1220 06/08/19 0311  DDIMER 0.37  --   FERRITIN 771* 928*  LDH 212* 250*  CRP 3.3* 5.3*   Lab  Results  Component Value Date   SARSCOV2NAA POSITIVE (A) 06/07/2019   Thrombocytopenia - likely acute phase reactant in the setting of above, POA -SCDs for now; follow repeat labs - 75 today - no sign/symptoms of bleeding  Diabetes mellitus-non-insulin dependent, uncontrolled -Home medications Metformin only - holding at admission -Hemoglobin A1c 11.7 -Continue sliding scale insulin, hypoglycemic protocol -Will start Lantus 10u QHS to mitigate steroid effect.  Hypertension-stable. -Continue home losartan HCTZ, propranolol, Norvasc  Coronary artery disease -stable.  Denies chest pain.  EKG read as atrial fibrillation but actually P waves are present and rate is regular. -Repeat EKG -Continue home statins, zetia, aspirin.  Tremors, essential -Continue home primidone, propranolol  DVT prophylaxis: SCDS for now Code Status: Full Family Communication: None at bedside Disposition Plan: Per rounding team Consults called: None Admission status: Inpatient, I certify that at the point of admission it is my clinical judgment that the patient will require inpatient hospital care spanning beyond 2 midnights from the point of admission due to high intensity of service, high risk for further deterioration and high frequency of surveillance required. The following factors support the patient status of inpatient: COVID-19 pneumonia, mild hypoxia, with chest x-ray findings requiring steroids are in place medically at this time.  Review of Systems: As per HPI all other systems reviewed and negative.  Past Medical History:  Diagnosis Date  . Coronary artery disease    s/p PCI of LAD w Residual 50-60% RCA  . Diabetes ([place])   . Dilated aortic root ([facility])    16mm by echo  01/2018  . High cholesterol   . History of echocardiogram 06/2011   Normal LVF,mild LVH,trivial TR/MR, mild AS/AI  . History of kidney stones   . Hypertension   . Mild aortic stenosis    mean AV gradient 45mmHg by echo 02/2016  .  Tremor     Past Surgical History:  Procedure Laterality Date  . arm surgery Right   . stents - placement     x 2     reports that he has never smoked. He has never used smokeless tobacco. He reports current alcohol use. He reports that he does not use drugs.  Allergies  Allergen Reactions  . Codeine Other (See Comments)    Vertigo and upset stomach    Family History  Problem Relation Age of Onset  . Other Father        unsure of history  . CAD Brother   . Heart failure Mother   . Emphysema Mother   . CAD Mother     Prior to Admission medications   Medication Sig Start Date End Date Taking? Authorizing Provider  allopurinol (ZYLOPRIM) 300 MG tablet Take 1 tablet (300 mg total) by mouth daily. 03/10/15  Yes Turner, Eber Hong, MD  amLODipine (NORVASC) 10 MG tablet Take 1 tablet (10 mg total) by mouth daily. 02/28/17  Yes Sueanne Margarita, MD  aspirin 81 MG tablet Take 81 mg by mouth daily.   Yes [provider]  buPROPion (WELLBUTRIN XL) 300 MG 24 hr tablet Take 300 mg by mouth daily.   Yes [provider]  Cholecalciferol (VITAMIN D) 125 MCG (5000 UT) CAPS Take 1 tablet by mouth daily.   Yes [provider]  ezetimibe (ZETIA) 10 MG tablet TAKE 1 TABLET BY MOUTH DAILY. PLEASE MAKE OVERDUE APPT WITH DR. Radford Pax BEFORE ANYMORE REFILLS 02/10/19  Yes Turner, Eber Hong, MD  indomethacin (INDOCIN) 50 MG capsule Take 50 mg as needed by mouth (w/food - take as directed).    Yes [provider]  losartan-hydrochlorothiazide (HYZAAR) 100-25 MG tablet Take 1 tablet daily by mouth. 09/26/17  Yes Daune Perch, NP  metFORMIN (GLUCOPHAGE) 500 MG tablet Take 500 mg daily by mouth. 09/14/17  Yes [provider]  nitroGLYCERIN (NITROSTAT) 0.4 MG SL tablet Place 1 tablet (0.4 mg total) under the tongue every 5 (five) minutes as needed for chest pain. 05/09/17  Yes Turner, Eber Hong, MD  primidone (MYSOLINE) 50 MG tablet Take 1 tablet (50 mg total) by mouth 3  (three) times daily. 07/04/18  Yes Marcial Pacas, MD  propranolol ER (INDERAL LA) 120 MG 24 hr capsule Take 2 capsules (240 mg total) by mouth daily. 07/04/18  Yes Marcial Pacas, MD  rosuvastatin (CRESTOR) 20 MG tablet Take 1 tablet (20 mg total) by mouth daily. 04/17/18  Yes Simmons, Brittainy M, PA-C  pantoprazole (PROTONIX) 40 MG tablet Take 1 tablet by mouth daily. 03/04/19   [provider]    Physical Exam: Vitals:   06/08/19 0700 06/08/19 0809 06/08/19 0822 06/08/19 1130  BP:  (!) 188/91 (!) 144/116 131/84  Pulse: (!) 52 82  64  Resp: 12 18  18   Temp:  97.6 F (36.4 C)  97.9 F (36.6 C)  TempSrc:  Oral  Oral  SpO2: 93% 91%  94%  Weight:      Height:        Constitutional: Appears flushed, calm, comfortable Vitals:   06/08/19 0700 06/08/19 0809 06/08/19 0822 06/08/19 1130  BP:  Marland Kitchen)  188/91 (!) 144/116 131/84  Pulse: (!) 52 82  64  Resp: 12 18  18   Temp:  97.6 F (36.4 C)  97.9 F (36.6 C)  TempSrc:  Oral  Oral  SpO2: 93% 91%  94%  Weight:      Height:       Eyes: PERRL, lids and conjunctivae normal ENMT: Mucous membranes are moist. Posterior pharynx clear of any exudate or lesions. Neck: normal, supple, no masses, no thyromegaly Respiratory: clear to auscultation bilaterally, no wheezing, no crackles. Normal respiratory effort. No accessory muscle use.  Cardiovascular: Regular rate and rhythm, no murmurs / rubs / gallops. No extremity edema. 2+ pedal pulses.   Abdomen: no tenderness, no masses palpated. No hepatosplenomegaly. Bowel sounds positive.  Musculoskeletal: no clubbing / cyanosis. No joint deformity upper and lower extremities. Good ROM, no contractures. Normal muscle tone.  Skin: no rashes, lesions, ulcers. No induration Neurologic: CN 2-12 grossly intact.  Strength 5/5 in all 4.  Psychiatric: Normal judgment and insight. Alert and oriented x 3. Normal mood.   Labs on Admission: I have personally reviewed following labs and imaging studies  CBC: Recent  Labs  Lab 06/07/19 1220 06/08/19 0311  WBC 3.6* 3.6*  NEUTROABS 2.2 2.4  HGB 16.6 16.5  HCT 48.1 48.7  MCV 91.8 94.6  PLT 83* 75*   Basic Metabolic Panel: Recent Labs  Lab 06/07/19 1220 06/08/19 0311  NA 134* 137  K 4.1 3.5  CL 99 101  CO2 24 23  GLUCOSE 290* 358*  BUN 19 19  CREATININE 1.16 1.22  CALCIUM 9.1 8.6*  MG  --  1.7  PHOS  --  3.2   Liver Function Tests: Recent Labs  Lab 06/07/19 1220 06/08/19 0311  AST 46* 42*  ALT 43 38  ALKPHOS 53 53  BILITOT 0.6 0.3  PROT 6.8 6.9  ALBUMIN 3.7 3.5   Coagulation Profile: Recent Labs  Lab 06/07/19 1220  INR 1.1   Cardiac Enzymes: Recent Labs  Lab 06/07/19 1220  CKTOTAL 49   Anemia Panel: Recent Labs    06/07/19 1220 06/08/19 0311  FERRITIN 771* 928*    Radiological Exams on Admission: Dg Chest Port 1 View  Result Date: 06/07/2019 CLINICAL DATA:  Cough, shortness of breath EXAM: PORTABLE CHEST 1 VIEW COMPARISON:  03/23/2010 FINDINGS: The heart size and mediastinal contours are within normal limits. Relatively low volume AP portable examination with heterogeneous opacity of the left lung base. The visualized skeletal structures are unremarkable. IMPRESSION: Relatively low volume AP portable examination with heterogeneous opacity of the left lung base, concerning for infection or aspiration. PA and lateral radiographs may be helpful to more clearly evaluate. Electronically Signed   By: Eddie Candle M.D.   On: 06/07/2019 13:07    EKG: Independently reviewed.  Read as atrial fibrillation. But P waves are present and rate is regular.  QTc 498.  Little Ishikawa MD [facility]  06/08/2019, 3:24 PM

## 2019-06-09 DIAGNOSIS — G250 Essential tremor: Secondary | ICD-10-CM

## 2019-06-09 DIAGNOSIS — K529 Noninfective gastroenteritis and colitis, unspecified: Secondary | ICD-10-CM

## 2019-06-09 LAB — CBC
HCT: 46.9 % (ref 39.0–52.0)
Hemoglobin: 15.9 g/dL (ref 13.0–17.0)
MCH: 31.3 pg (ref 26.0–34.0)
MCHC: 33.9 g/dL (ref 30.0–36.0)
MCV: 92.3 fL (ref 80.0–100.0)
Platelets: 101 10*3/uL — ABNORMAL LOW (ref 150–400)
RBC: 5.08 MIL/uL (ref 4.22–5.81)
RDW: 13.4 % (ref 11.5–15.5)
WBC: 6.3 10*3/uL (ref 4.0–10.5)
nRBC: 0 % (ref 0.0–0.2)

## 2019-06-09 LAB — GLUCOSE, CAPILLARY
Glucose-Capillary: 372 mg/dL — ABNORMAL HIGH (ref 70–99)
Glucose-Capillary: 132 mg/dL — ABNORMAL HIGH (ref 70–99)
Glucose-Capillary: 254 mg/dL — ABNORMAL HIGH (ref 70–99)
Glucose-Capillary: 194 mg/dL — ABNORMAL HIGH (ref 70–99)
Glucose-Capillary: 189 mg/dL — ABNORMAL HIGH (ref 70–99)
Glucose-Capillary: 193 mg/dL — ABNORMAL HIGH (ref 70–99)

## 2019-06-09 LAB — D-DIMER, QUANTITATIVE: D-Dimer, Quant: 0.59 ug/mL-FEU — ABNORMAL HIGH (ref 0.00–0.50)

## 2019-06-09 LAB — COMPREHENSIVE METABOLIC PANEL
ALT: 33 U/L (ref 0–44)
AST: 37 U/L (ref 15–41)
Albumin: 3.5 g/dL (ref 3.5–5.0)
Alkaline Phosphatase: 51 U/L (ref 38–126)
Anion gap: 12 (ref 5–15)
BUN: 26 mg/dL — ABNORMAL HIGH (ref 8–23)
CO2: 25 mmol/L (ref 22–32)
Calcium: 9.2 mg/dL (ref 8.9–10.3)
Chloride: 102 mmol/L (ref 98–111)
Creatinine, Ser: 1.16 mg/dL (ref 0.61–1.24)
GFR calc Af Amer: >60 mL/min (ref >60)
GFR calc non Af Amer: >60 mL/min (ref >60)
Glucose, Bld: 115 mg/dL — ABNORMAL HIGH (ref 70–99)
Potassium: 3.4 mmol/L — ABNORMAL LOW (ref 3.5–5.1)
Sodium: 139 mmol/L (ref 135–145)
Total Bilirubin: 0.7 mg/dL (ref 0.3–1.2)
Total Protein: 6.9 g/dL (ref 6.5–8.1)

## 2019-06-09 LAB — C-REACTIVE PROTEIN: CRP: 4.6 mg/dL — ABNORMAL HIGH (ref <1.0)

## 2019-06-09 LAB — FERRITIN: Ferritin: 1064 ng/mL — ABNORMAL HIGH (ref 24–336)

## 2019-06-09 MED ORDER — PROPRANOLOL HCL ER 80 MG PO CP24
240.0000 mg | ORAL_CAPSULE | Freq: Every day | ORAL | Status: DC
  Administered 2019-06-09 – 2019-06-12 (×4): 240 mg via ORAL
  Filled 2019-06-09 (×6): qty 3

## 2019-06-09 MED ORDER — GUAIFENESIN-DM 100-10 MG/5ML PO SYRP
10.0000 mL | ORAL_SOLUTION | ORAL | Status: DC | PRN
  Administered 2019-06-09 – 2019-06-12 (×7): 10 mL via ORAL
  Filled 2019-06-09 (×7): qty 10

## 2019-06-09 NOTE — Care Management (Addendum)
PTA independent from home with wife, pt can not find transportation home and request assistance.  Pt has thermometer in the home and will contact PCP for follow up appt.  Pt must transfer by PTAR due to being covid positive.  TOC printed transport pkg for pt to unit Network engineer.  Secretary confirms she has packet.  Unit bedside nurse to call transport PTAR once pt is ready.

## 2019-06-09 NOTE — Progress Notes (Signed)
Pt ambulated in hall, did 1 round the unit.  O2 Saturation was between 91-95%, average however was 93%.  HR in the Mid to upper 60's.  Pt tolerated activity well.  Is now in bed resting comfortably.

## 2019-06-09 NOTE — Care Management (Signed)
AC to take Pulse Ox to pt bedside.

## 2019-06-09 NOTE — Care Management Important Message (Signed)
Important Message  Patient Details  Name: [name] [name] MRN: [id number] Date of Birth: Jan 30, 1951   Medicare Important Message Given:  Yes - Important Message mailed due to current National Emergency   Verbal consent obtained due to current National Emergency  Relationship to patient: Spouse/Significant Other Contact Name: [name] Call Date: 06/09/19  Time: 1611 Phone: [phone] Outcome: Spoke with contact Important Message mailed to: Patient address on file      [name] Montine Circle 06/09/2019, 4:11 PM

## 2019-06-09 NOTE — Evaluation (Signed)
Physical Therapy Evaluation Patient Details Name: [name] [name] MRN: [id number] DOB: 02-20-1951 Today's Date: 06/09/2019   History of Present Illness  [name] [name] is a 68 y.o. male with medical history significant for coronary artery disease, hypertension, obstructive sleep apnea, aortic stenosis, diabetes mellitus, tremor, patient presented to the ED with complaints of cough and wheezing , diarrhea, temp of 103.SARS-CoV-2 test positive.  Portable chest x-ray shows left lower lobe opacity  Clinical Impression  The patient is very sluggish, sleepy and unsteady to ambulate x 20'. This is a big difference from RN report earlier where as the  Patient ambulated in the hall. Patient's tremors were escalating during the mobility. Patient's O2 sats 86-88%, HR 67.   Orthostatics sup:132/71 \\sit  105/71 Standing 111/65 The  patient is currently a risk for falling as he presents with fatigue and drop in pressure. He  Had much difficulty attempting to place his belt through loops and zipping  Pants, requiring asssutance for standing and balance and with the tasks.  Pt admitted with above diagnosis. Pt currently with functional limitations due to the deficits listed below (see PT Problem List). Pt will benefit from skilled PT to increase their independence and safety with mobility to allow discharge to the venue listed below.       Follow Up Recommendations No PT follow up    Equipment Recommendations  (tba)    Recommendations for Other Services       Precautions / Restrictions Precautions Precaution Comments: monitor sats      Mobility  Bed Mobility Overal bed mobility: Needs Assistance Bed Mobility: Rolling;Sidelying to Sit Rolling: Min guard Sidelying to sit: Min assist       General bed mobility comments: extra time, some assist with trunk, patient is very sluggish  Transfers Overall transfer level: Needs assistance Equipment used: Rolling walker (2 wheeled) Transfers: Sit to/from  Stand Sit to Stand: Min assist         General transfer comment: steady assist, noted increased tremors  Ambulation/Gait Ambulation/Gait assistance: Mod assist Gait Distance (Feet): 20 Feet Assistive device: Rolling walker (2 wheeled) Gait Pattern/deviations: Step-to pattern;Drifts right/left;Shuffle Gait velocity: decr   General Gait Details: gait is  slow and sluggish, noted increased tremors of the UE's with the activity.  Stairs            Wheelchair Mobility    Modified Rankin (Stroke Patients Only)       Balance Overall balance assessment: Needs assistance Sitting-balance support: Bilateral upper extremity supported;Feet supported Sitting balance-Leahy Scale: Fair     Standing balance support: During functional activity;Bilateral upper extremity supported Standing balance-Leahy Scale: Poor Standing balance comment: patient sways and tremors                             Pertinent Vitals/Pain Pain Assessment: Faces Faces Pain Scale: Hurts even more Pain Location: all over Pain Descriptors / Indicators: Aching Pain Intervention(s): Monitored during session    Home Living Family/patient expects to be discharged to:: Private residence Living Arrangements: Spouse/significant other Available Help at Discharge: Family Type of Home: House Home Access: Ramped entrance;Stairs to enter Entrance Stairs-Rails: Right Entrance Stairs-Number of Steps: 5- easiest entrance, ramp in back Home Layout: One level Home Equipment: None      Prior Function Level of Independence: Independent         Comments: works in Environmental manager  Extremity/Trunk Assessment   Upper Extremity Assessment Upper Extremity Assessment: Generalized weakness;RUE deficits/detail;LUE deficits/detail RUE Deficits / Details: significant tremors while mobilizing LUE Deficits / Details: same    Lower Extremity Assessment Lower Extremity Assessment:  Generalized weakness       Communication   Communication: No difficulties  Cognition Arousal/Alertness: Lethargic Behavior During Therapy: Restless;WFL for tasks assessed/performed Overall Cognitive Status: Within Functional Limits for tasks assessed                                 General Comments: states  that he has to take care of wife      General Comments      Exercises     Assessment/Plan    PT Assessment Patient needs continued PT services  PT Problem List Decreased strength;Decreased mobility;Decreased safety awareness;Decreased knowledge of precautions;Decreased activity tolerance;Cardiopulmonary status limiting activity;Decreased balance;Decreased knowledge of use of DME       PT Treatment Interventions DME instruction;Gait training;Functional mobility training;Stair training;Balance training;Therapeutic exercise;Therapeutic activities;Patient/family education    PT Goals (Current goals can be found in the Care Plan section)  Acute Rehab PT Goals Patient Stated Goal: to feel better and take care of my wife PT Goal Formulation: With patient Time For Goal Achievement: 06/23/19    Frequency Min 3X/week   Barriers to discharge Decreased caregiver support      Co-evaluation               AM-PAC PT "6 Clicks" Mobility  Outcome Measure Help needed turning from your back to your side while in a flat bed without using bedrails?: A Little Help needed moving from lying on your back to sitting on the side of a flat bed without using bedrails?: A Little Help needed moving to and from a bed to a chair (including a wheelchair)?: A Lot Help needed standing up from a chair using your arms (e.g., wheelchair or bedside chair)?: A Lot Help needed to walk in hospital room?: A Lot Help needed climbing 3-5 steps with a railing? : A Lot 6 Click Score: 14    End of Session Equipment Utilized During Treatment: Gait belt Activity Tolerance: Patient limited  by fatigue Patient left: in bed;with bed alarm set Nurse Communication: Mobility status PT Visit Diagnosis: Difficulty in walking, not elsewhere classified (R26.2)    Time: 1710-1738 PT Time Calculation (min) (ACUTE ONLY): 28 min   Charges:   PT Evaluation $PT Eval Low Complexity: 1 Low PT Treatments $Gait Training: 8-22 mins        [place]  Office [phone]   Claretha Cooper 06/09/2019, 6:05 PM

## 2019-06-09 NOTE — Progress Notes (Signed)
Pt had indicated around 1300 he was starting to feel tired, and resting in bed.  At approximately 1430, I told him he would be able today, and he was in agreement to leave.  Pt did not have ride home, but made arrangement with transport service to take him home around 1630.  At approximately 1615, I went to help the Pt get ready.Marland Kitchen He was attempting to put on his pants and he sat back several times into the bed, unable to complete the task at hand.  He said he was very tired, dizzy and weak.  MD notified, orthostatic b/p ordered and completed, along with PT/OT evaluation that was completed. During Pt's evaluation, Pt was desating into the mid to upper 80's, which was not consistent with what he was doing earlier in the day.   Spoke to Dr. Avon Gully again, about Pt and discharge, he advised that Pt would stay tonight and to discontinue his discharge orders.  Which have been completed, per the request of the MD Pt is now resting in bed, he was noted in the 88-89, he was placed on 2 L N/C, since he was c/o fatigue and dizziness. This was reported to night nurse, to continue monitoring the Pt.

## 2019-06-09 NOTE — Plan of Care (Signed)

## 2019-06-09 NOTE — Discharge Summary (Addendum)
Physician Discharge Summary  [name] [name] FTD:[id number] DOB: 1951/07/25 DOA: 06/07/2019  PCP: [name], MD  Admit date: 06/07/2019 Discharge date: 06/09/2019  Admitted From: Home Disposition:  Home  Recommendations for Outpatient Follow-up:  1. Follow up with PCP in 1-2 weeks 2. Please obtain BMP/CBC in one week  Discharge Condition: Stable CODE STATUS: Full Diet recommendation: Low carb, low-salt, low-fat diet  Brief/Interim Summary: [name] [name] is a 68 y.o. male with medical history significant for coronary artery disease, hypertension, obstructive sleep apnea, aortic stenosis, diabetes mellitus, tremor, patient presented to the ED with complaints of cough and wheezing over the past week, and diarrhea.  Patient denies difficulty breathing, and his diarrhea has significantly improved..  Patient's wife is also ill with similar symptoms.  Patient had COVID testing done yesterday he is awaiting result.  Temperature up to 104 recorded today at home. EMS reports O2 sats 85% on room air on arrival. Per ED provider temperature of 103 recorded in ED, heart rate 70s to 80s,  intermittent tachypnea to 28, O2 sats on room air greater than 90%.  Systolic blood pressure 062B to 140s.  WBC 3.6.  Mild elevated AST 46.  Mild lactic acidosis 2.1 >> 1.2.  Inflammatory markers checked mildly elevated CRP, LDH and ferritin.  But d-dimer within normal limits 0.37.  SARS-CoV-2 test positive.  Portable chest x-ray shows left lower lobe opacity.  Patient initially started on IV ceftriaxone and azithromycin for localized pneumonia.  Hospitalist to admit for COVID pneumonia.  Patient admitted as above with episodes of diarrhea which appear to be somewhat chronic per his history of chronic diverticulosis, as well as a cough.  Patient did report fever at home as high as 103.  Patient was reported to be transiently hypoxic in route to the hospital although remained without hypoxia here at rest or with ambulation.   Given resolution in patient's initial complaints of diarrhea without further respiratory complaint will discharge patient home with close follow-up with PCP as scheduled.  Patient educated at length should his respiratory status worsen, should he feel tired or short of breath with dyspnea or at rest, have ongoing high-grade fevers would recommend returning to the ED for further evaluation and treatment. Patient will be discharged home with pulse ox to monitor his oxygen levels after discharge. A1C noted to be markedly elevated at 11.7 - will need close follow up with PCP for further discussion about need for dietary/lifestyle restrictions. Of note patient's wife is also quite ill, discussed case with patient's family including daughter for close monitoring to ensure patient and his wife both on her symptoms closely to ensure safe transition home.  Patient otherwise stable and agreeable for discharge.  Late this afternoon patient was noted to be somewhat unstable while attempting to dress. Patient evaluated by PT with notable hypoxia with exertion not noted on previous ambulatory screen this afternoon. Patient also noted to have orthostatic hypotension likely exacerbating his symptoms. Will cancel DC and follow clinically overnight.  Discharge Diagnoses:  Active Problems:   Essential tremor   Viral pneumonia   Chronic diarrhea  Discharge Instructions  Allergies as of 06/09/2019      Reactions   Codeine Other (See Comments)   Vertigo and upset stomach      Medication List    STOP taking these medications   indomethacin 50 MG capsule Commonly known as: INDOCIN     TAKE these medications   allopurinol 300 MG tablet Commonly known as: ZYLOPRIM Take  1 tablet (300 mg total) by mouth daily.   amLODipine 10 MG tablet Commonly known as: NORVASC Take 1 tablet (10 mg total) by mouth daily.   aspirin 81 MG tablet Take 81 mg by mouth daily.   buPROPion 300 MG 24 hr tablet Commonly known as:  WELLBUTRIN XL Take 300 mg by mouth daily.   ezetimibe 10 MG tablet Commonly known as: ZETIA TAKE 1 TABLET BY MOUTH DAILY. PLEASE MAKE OVERDUE APPT WITH DR. Radford Pax BEFORE ANYMORE REFILLS   losartan-hydrochlorothiazide 100-25 MG tablet Commonly known as: Hyzaar Take 1 tablet daily by mouth.   metFORMIN 500 MG tablet Commonly known as: GLUCOPHAGE Take 500 mg daily by mouth.   nitroGLYCERIN 0.4 MG SL tablet Commonly known as: NITROSTAT Place 1 tablet (0.4 mg total) under the tongue every 5 (five) minutes as needed for chest pain.   pantoprazole 40 MG tablet Commonly known as: PROTONIX Take 1 tablet by mouth daily.   primidone 50 MG tablet Commonly known as: MYSOLINE Take 1 tablet (50 mg total) by mouth 3 (three) times daily.   propranolol ER 120 MG 24 hr capsule Commonly known as: Inderal LA Take 2 capsules (240 mg total) by mouth daily.   rosuvastatin 20 MG tablet Commonly known as: CRESTOR Take 1 tablet (20 mg total) by mouth daily.   Vitamin D 125 MCG (5000 UT) Caps Take 1 tablet by mouth daily.            Durable Medical Equipment  (From admission, onward)         Start     Ordered   06/08/19 1152  For home use only DME Pulse oximeter  Once     06/08/19 1152          Allergies  Allergen Reactions  . Codeine Other (See Comments)    Vertigo and upset stomach   Procedures/Studies: Dg Chest Port 1 View  Result Date: 06/07/2019 CLINICAL DATA:  Cough, shortness of breath EXAM: PORTABLE CHEST 1 VIEW COMPARISON:  03/23/2010 FINDINGS: The heart size and mediastinal contours are within normal limits. Relatively low volume AP portable examination with heterogeneous opacity of the left lung base. The visualized skeletal structures are unremarkable. IMPRESSION: Relatively low volume AP portable examination with heterogeneous opacity of the left lung base, concerning for infection or aspiration. PA and lateral radiographs may be helpful to more clearly evaluate.  Electronically Signed   By: Eddie Candle M.D.   On: 06/07/2019 13:07    Subjective: No acute issues or events overnight, ambulating without hypoxia, otherwise stable and agreeable for discharge home.   Discharge Exam: Vitals:   06/09/19 1030 06/09/19 1135  BP:  125/69  Pulse: 71 75  Resp:  (!) 23  Temp:  99.6 F (37.6 C)  SpO2:  92%   Vitals:   06/09/19 0813 06/09/19 1020 06/09/19 1030 06/09/19 1135  BP:    125/69  Pulse: 69 67 71 75  Resp: 18   (!) 23  Temp: 99.4 F (37.4 C)   99.6 F (37.6 C)  TempSrc: Oral   Oral  SpO2: 92% 93%  92%  Weight:      Height:        General:  Pleasantly resting in bed, No acute distress. HEENT:  Normocephalic atraumatic.  Sclerae nonicteric, noninjected.  Extraocular movements intact bilaterally. Neck:  Without mass or deformity.  Trachea is midline. Lungs:  Clear to auscultate bilaterally without rhonchi, wheeze, or rales. Heart:  Regular rate and rhythm.  Without  murmurs, rubs, or gallops. Abdomen:  Soft, nontender, nondistended.  Without guarding or rebound. Extremities: Without cyanosis, clubbing, edema, or obvious deformity. Vascular:  Dorsalis pedis and posterior tibial pulses palpable bilaterally. Skin:  Warm and dry, no erythema, no ulcerations.  Microbiology: Recent Results (from the past 240 hour(s))  Blood Culture (routine x 2)     Status: None (Preliminary result)   Collection Time: 06/07/19 12:20 PM   Specimen: BLOOD RIGHT ARM  Result Value Ref Range Status   Specimen Description BLOOD RIGHT ARM  Final   Special Requests   Final    BOTTLES DRAWN AEROBIC AND ANAEROBIC Blood Culture adequate volume   Culture   Final    NO GROWTH 2 DAYS Performed at [facility], [address] [place], [place] [address]    Report Status PENDING  Incomplete  SARS Coronavirus 2 (CEPHEID- Performed in [place] hospital lab), Hosp Order     Status: Abnormal   Collection Time: 06/07/19 12:20 PM   Specimen: Nasopharyngeal Swab  Result  Value Ref Range Status   SARS Coronavirus 2 POSITIVE (A) NEGATIVE Final    Comment: RESULT CALLED TO, READ BACK BY AND VERIFIED WITH: D. MARTIN @1450  07/18/2020KAY (NOTE) If result is NEGATIVE SARS-CoV-2 target nucleic acids are NOT DETECTED. The SARS-CoV-2 RNA is generally detectable in upper and lower  respiratory specimens during the acute phase of infection. The lowest  concentration of SARS-CoV-2 viral copies this assay can detect is 250  copies / mL. A negative result does not preclude SARS-CoV-2 infection  and should not be used as the sole basis for treatment or other  patient management decisions.  A negative result may occur with  improper specimen collection / handling, submission of specimen other  than nasopharyngeal swab, presence of viral mutation(s) within the  areas targeted by this assay, and inadequate number of viral copies  (<250 copies / mL). A negative result must be combined with clinical  observations, patient history, and epidemiological information. If result is POSITIVE SARS-CoV-2 target nucleic acids are DETECTED. The SAR S-CoV-2 RNA is generally detectable in upper and lower  respiratory specimens during the acute phase of infection.  Positive  results are indicative of active infection with SARS-CoV-2.  Clinical  correlation with patient history and other diagnostic information is  necessary to determine patient infection status.  Positive results do  not rule out bacterial infection or co-infection with other viruses. If result is PRESUMPTIVE POSTIVE SARS-CoV-2 nucleic acids MAY BE PRESENT.   A presumptive positive result was obtained on the submitted specimen  and confirmed on repeat testing.  While 2019 novel coronavirus  (SARS-CoV-2) nucleic acids may be present in the submitted sample  additional confirmatory testing may be necessary for epidemiological  and / or clinical management purposes  to differentiate between  SARS-CoV-2 and other  Sarbecovirus currently known to infect humans.  If clinically indicated additional testing with an alternate test  methodology [phone]) is advis ed. The SARS-CoV-2 RNA is generally  detectable in upper and lower respiratory specimens during the acute  phase of infection. The expected result is Negative. Fact Sheet for Patients:  StrictlyIdeas.no Fact Sheet for Healthcare Providers: BankingDealers.co.za This test is not yet approved or cleared by the [place] FDA and has been authorized for detection and/or diagnosis of SARS-CoV-2 by FDA under an Emergency Use Authorization (EUA).  This EUA will remain in effect (meaning this test can be used) for the duration of the COVID-19 declaration under Section 564(b)(1) of  the Act, 21 U.S.C. section 360bbb-3(b)(1), unless the authorization is terminated or revoked sooner. Performed at [facility], [address] [place], [place] [address]   Blood Culture (routine x 2)     Status: None (Preliminary result)   Collection Time: 06/07/19 12:50 PM   Specimen: BLOOD RIGHT HAND  Result Value Ref Range Status   Specimen Description BLOOD RIGHT HAND BOTTLES DRAWN AEROBIC ONLY  Final   Special Requests Blood Culture adequate volume  Final   Culture   Final    NO GROWTH 2 DAYS Performed at [facility], [address] [place], [place] [address]    Report Status PENDING  Incomplete     Labs: Basic Metabolic Panel: Recent Labs  Lab 06/07/19 1220 06/08/19 0311 06/09/19 0325  NA 134* 137 139  K 4.1 3.5 3.4*  CL 99 101 102  CO2 24 23 25   GLUCOSE 290* 358* 115*  BUN 19 19 26*  CREATININE 1.16 1.22 1.16  CALCIUM 9.1 8.6* 9.2  MG  --  1.7  --   PHOS  --  3.2  --    Liver Function Tests: Recent Labs  Lab 06/07/19 1220 06/08/19 0311 06/09/19 0325  AST 46* 42* 37  ALT 43 38 33  ALKPHOS 53 53 51  BILITOT 0.6 0.3 0.7  PROT 6.8 6.9 6.9  ALBUMIN 3.7 3.5 3.5   CBC: Recent Labs  Lab  06/07/19 1220 06/08/19 0311 06/09/19 0325  WBC 3.6* 3.6* 6.3  NEUTROABS 2.2 2.4  --   HGB 16.6 16.5 15.9  HCT 48.1 48.7 46.9  MCV 91.8 94.6 92.3  PLT 83* 75* 101*   CBG: Recent Labs  Lab 06/08/19 1819 06/08/19 1939 06/08/19 2121 06/09/19 0812 06/09/19 1135  GLUCAP 458* 473* 350* 132* 254*   D-Dimer Recent Labs    06/07/19 1220 06/09/19 0325  DDIMER 0.37 0.59*   Hgb A1c Recent Labs    06/07/19 1220  HGBA1C 11.7*   Urinalysis    Component Value Date/Time   COLORURINE YELLOW 03/23/2010 0953   APPEARANCEUR CLEAR 03/23/2010 0953   LABSPEC 1.020 03/23/2010 0953   PHURINE 7.0 03/23/2010 0953   GLUCOSEU NEGATIVE 03/23/2010 0953   HGBUR NEGATIVE 03/23/2010 0953   BILIRUBINUR NEGATIVE 03/23/2010 0953   KETONESUR NEGATIVE 03/23/2010 0953   PROTEINUR NEGATIVE 03/23/2010 0953   UROBILINOGEN 1.0 03/23/2010 0953   NITRITE NEGATIVE 03/23/2010 0953   LEUKOCYTESUR  03/23/2010 0953    NEGATIVE MICROSCOPIC NOT DONE ON URINES WITH NEGATIVE PROTEIN, BLOOD, LEUKOCYTES, NITRITE, OR GLUCOSE <1000 mg/dL.   Sepsis Labs Invalid input(s): PROCALCITONIN,  WBC,  LACTICIDVEN Microbiology Recent Results (from the past 240 hour(s))  Blood Culture (routine x 2)     Status: None (Preliminary result)   Collection Time: 06/07/19 12:20 PM   Specimen: BLOOD RIGHT ARM  Result Value Ref Range Status   Specimen Description BLOOD RIGHT ARM  Final   Special Requests   Final    BOTTLES DRAWN AEROBIC AND ANAEROBIC Blood Culture adequate volume   Culture   Final    NO GROWTH 2 DAYS Performed at [facility], [address], [place], [place] [address]    Report Status PENDING  Incomplete  SARS Coronavirus 2 (CEPHEID- Performed in [place] hospital lab), Hosp Order     Status: Abnormal   Collection Time: 06/07/19 12:20 PM   Specimen: Nasopharyngeal Swab  Result Value Ref Range Status   SARS Coronavirus 2 POSITIVE (A) NEGATIVE Final    Comment: RESULT CALLED TO,  READ BACK BY AND VERIFIED  WITH: D. MARTIN @1450  07/18/2020KAY (NOTE) If result is NEGATIVE SARS-CoV-2 target nucleic acids are NOT DETECTED. The SARS-CoV-2 RNA is generally detectable in upper and lower  respiratory specimens during the acute phase of infection. The lowest  concentration of SARS-CoV-2 viral copies this assay can detect is 250  copies / mL. A negative result does not preclude SARS-CoV-2 infection  and should not be used as the sole basis for treatment or other  patient management decisions.  A negative result may occur with  improper specimen collection / handling, submission of specimen other  than nasopharyngeal swab, presence of viral mutation(s) within the  areas targeted by this assay, and inadequate number of viral copies  (<250 copies / mL). A negative result must be combined with clinical  observations, patient history, and epidemiological information. If result is POSITIVE SARS-CoV-2 target nucleic acids are DETECTED. The SAR S-CoV-2 RNA is generally detectable in upper and lower  respiratory specimens during the acute phase of infection.  Positive  results are indicative of active infection with SARS-CoV-2.  Clinical  correlation with patient history and other diagnostic information is  necessary to determine patient infection status.  Positive results do  not rule out bacterial infection or co-infection with other viruses. If result is PRESUMPTIVE POSTIVE SARS-CoV-2 nucleic acids MAY BE PRESENT.   A presumptive positive result was obtained on the submitted specimen  and confirmed on repeat testing.  While 2019 novel coronavirus  (SARS-CoV-2) nucleic acids may be present in the submitted sample  additional confirmatory testing may be necessary for epidemiological  and / or clinical management purposes  to differentiate between  SARS-CoV-2 and other Sarbecovirus currently known to infect humans.  If clinically indicated additional testing with an alternate test  methodology [phone])  is advis ed. The SARS-CoV-2 RNA is generally  detectable in upper and lower respiratory specimens during the acute  phase of infection. The expected result is Negative. Fact Sheet for Patients:  StrictlyIdeas.no Fact Sheet for Healthcare Providers: BankingDealers.co.za This test is not yet approved or cleared by the [place] FDA and has been authorized for detection and/or diagnosis of SARS-CoV-2 by FDA under an Emergency Use Authorization (EUA).  This EUA will remain in effect (meaning this test can be used) for the duration of the COVID-19 declaration under Section 564(b)(1) of the Act, 21 U.S.C. section 360bbb-3(b)(1), unless the authorization is terminated or revoked sooner. Performed at [facility], [address], [place], [place] [address]   Blood Culture (routine x 2)     Status: None (Preliminary result)   Collection Time: 06/07/19 12:50 PM   Specimen: BLOOD RIGHT HAND  Result Value Ref Range Status   Specimen Description BLOOD RIGHT HAND BOTTLES DRAWN AEROBIC ONLY  Final   Special Requests Blood Culture adequate volume  Final   Culture   Final    NO GROWTH 2 DAYS Performed at [facility], [address], [place], [place] [address]    Report Status PENDING  Incomplete   Time coordinating discharge: Over 30 minutes  SIGNED:  Little Ishikawa, DO [facility] 06/09/2019, 1:11 PM Pager   If 7PM-7AM, please contact night-coverage [email or web address] Password [email or web address]

## 2019-06-09 NOTE — Discharge Instructions (Addendum)
COVID-19 Frequently Asked Questions °COVID-19 (coronavirus disease) is an infection that is caused by a large family of viruses. Some viruses cause illness in people and others cause illness in animals like camels, cats, and bats. In some cases, the viruses that cause illness in animals can spread to humans. °Where did the coronavirus come from? °In December 2019, [place] told the World Health Organization (WHO) of several cases of lung disease (human respiratory illness). These cases were linked to an open seafood and livestock market in the city of [place]. The link to the seafood and livestock market suggests that the virus may have spread from animals to humans. However, since that first outbreak in December, the virus has also been shown to spread from person to person. °What is the name of the disease and the virus? °Disease name °Early on, this disease was called novel coronavirus. This is because scientists determined that the disease was caused by a new (novel) respiratory virus. The World Health Organization (WHO) has now named the disease COVID-19, or coronavirus disease. °Virus name °The virus that causes the disease is called severe acute respiratory syndrome coronavirus 2 (SARS-CoV-2). °More information on disease and virus naming °World Health Organization (WHO): www.who.int/emergencies/diseases/novel-coronavirus-2019/technical-guidance/naming-the-coronavirus-disease-(covid-2019)-and-the-virus-that-causes-it °Who is at risk for complications from coronavirus disease? °Some people may be at higher risk for complications from coronavirus disease. This includes older adults and people who have chronic diseases, such as heart disease, diabetes, and lung disease. °If you are at higher risk for complications, take these extra precautions: °· Avoid close contact with people who are sick or have a fever or cough. Stay at least 3-6 ft (1-2 m) away from them, if possible. °· Wash your hands often with soap and  water for at least 20 seconds. °· Avoid touching your face, mouth, nose, or eyes. °· Keep supplies on hand at home, such as food, medicine, and cleaning supplies. °· Stay home as much as possible. °· Avoid social gatherings and travel. °How does coronavirus disease spread? °The virus that causes coronavirus disease spreads easily from person to person (is contagious). There are also cases of community-spread disease. This means the disease has spread to: °· People who have no known contact with other infected people. °· People who have not traveled to areas where there are known cases. °It appears to spread from one person to another through droplets from coughing or sneezing. °Can I get the virus from touching surfaces or objects? °There is still a lot that we do not know about the virus that causes coronavirus disease. Scientists are basing a lot of information on what they know about similar viruses, such as: °· Viruses cannot generally survive on surfaces for long. They need a human body (host) to survive. °· It is more likely that the virus is spread by close contact with people who are sick (direct contact), such as through: °? Shaking hands or hugging. °? Breathing in respiratory droplets that travel through the air. This can happen when an infected person coughs or sneezes on or near other people. °· It is less likely that the virus is spread when a person touches a surface or object that has the virus on it (indirect contact). The virus may be able to enter the body if the person touches a surface or object and then touches his or her face, eyes, nose, or mouth. °Can a person spread the virus without having symptoms of the disease? °It may be possible for the virus to spread before a person   has symptoms of the disease, but this is most likely not the main way the virus is spreading. It is more likely for the virus to spread by being in close contact with people who are sick and breathing in the respiratory  droplets of a sick person's cough or sneeze. °What are the symptoms of coronavirus disease? °Symptoms vary from person to person and can range from mild to severe. Symptoms may include: °· Fever. °· Cough. °· Tiredness, weakness, or fatigue. °· Fast breathing or feeling short of breath. °These symptoms can appear anywhere from 2 to 14 days after you have been exposed to the virus. If you develop symptoms, call your health care provider. People with severe symptoms may need hospital care. °If I am exposed to the virus, how long does it take before symptoms start? °Symptoms of coronavirus disease may appear anywhere from 2 to 14 days after a person has been exposed to the virus. If you develop symptoms, call your health care provider. °Should I be tested for this virus? °Your health care provider will decide whether to test you based on your symptoms, history of exposure, and your risk factors. °How does a health care provider test for this virus? °Health care providers will collect samples to send for testing. Samples may include: °· Taking a swab of fluid from the nose. °· Taking fluid from the lungs by having you cough up mucus (sputum) into a sterile cup. °· Taking a blood sample. °· Taking a stool or urine sample. °Is there a treatment or vaccine for this virus? °Currently, there is no vaccine to prevent coronavirus disease. Also, there are no medicines like antibiotics or antivirals to treat the virus. A person who becomes sick is given supportive care, which means rest and fluids. A person may also relieve his or her symptoms by using over-the-counter medicines that treat sneezing, coughing, and runny nose. These are the same medicines that a person takes for the common cold. °If you develop symptoms, call your health care provider. People with severe symptoms may need hospital care. °What can I do to protect myself and my family from this virus? ° °  ° °You can protect yourself and your family by taking the  same actions that you would take to prevent the spread of other viruses. Take the following actions: °· Wash your hands often with soap and water for at least 20 seconds. If soap and water are not available, use alcohol-based hand sanitizer. °· Avoid touching your face, mouth, nose, or eyes. °· Cough or sneeze into a tissue, sleeve, or elbow. Do not cough or sneeze into your hand or the air. °? If you cough or sneeze into a tissue, throw it away immediately and wash your hands. °· Disinfect objects and surfaces that you frequently touch every day. °· Avoid close contact with people who are sick or have a fever or cough. Stay at least 3-6 ft (1-2 m) away from them, if possible. °· Stay home if you are sick, except to get medical care. Call your health care provider before you get medical care. °· Make sure your vaccines are up to date. Ask your health care provider what vaccines you need. °What should I do if I need to travel? °Follow travel recommendations from your local health authority, the CDC, and WHO. °Travel information and advice °· Centers for Disease Control and Prevention (CDC): www.cdc.gov/coronavirus/2019-ncov/travelers/index.html °· World Health Organization (WHO): www.who.int/emergencies/diseases/novel-coronavirus-2019/travel-advice °Know the risks and take action to protect your health °·   You are at higher risk of getting coronavirus disease if you are traveling to areas with an outbreak or if you are exposed to travelers from areas with an outbreak. °· Wash your hands often and practice good hygiene to lower the risk of catching or spreading the virus. °What should I do if I am sick? °General instructions to stop the spread of infection °· Wash your hands often with soap and water for at least 20 seconds. If soap and water are not available, use alcohol-based hand sanitizer. °· Cough or sneeze into a tissue, sleeve, or elbow. Do not cough or sneeze into your hand or the air. °· If you cough or  sneeze into a tissue, throw it away immediately and wash your hands. °· Stay home unless you must get medical care. Call your health care provider or local health authority before you get medical care. °· Avoid public areas. Do not take public transportation, if possible. °· If you can, wear a mask if you must go out of the house or if you are in close contact with someone who is not sick. °Keep your home clean °· Disinfect objects and surfaces that are frequently touched every day. This may include: °? Counters and tables. °? Doorknobs and light switches. °? Sinks and faucets. °? Electronics such as phones, remote controls, keyboards, computers, and tablets. °· Wash dishes in hot, soapy water or use a dishwasher. Air-dry your dishes. °· Wash laundry in hot water. °Prevent infecting other household members °· Let healthy household members care for children and pets, if possible. If you have to care for children or pets, wash your hands often and wear a mask. °· Sleep in a different bedroom or bed, if possible. °· Do not share personal items, such as razors, toothbrushes, deodorant, combs, brushes, towels, and washcloths. °Where to find more information °Centers for Disease Control and Prevention (CDC) °· Information and news updates: www.cdc.gov/coronavirus/2019-ncov °World Health Organization (WHO) °· Information and news updates: www.who.int/emergencies/diseases/novel-coronavirus-2019 °· Coronavirus health topic: www.who.int/health-topics/coronavirus °· Questions and answers on COVID-19: www.who.int/news-room/q-a-detail/q-a-coronaviruses °· Global tracker: who.sprinklr.com °American Academy of Pediatrics (AAP) °· Information for families: www.healthychildren.org/English/health-issues/conditions/chest-lungs/Pages/2019-Novel-Coronavirus.aspx °The coronavirus situation is changing rapidly. Check your local health authority website or the CDC and WHO websites for updates and news. °When should I contact a health care  provider? °· Contact your health care provider if you have symptoms of an infection, such as fever or cough, and you: °? Have been near anyone who is known to have coronavirus disease. °? Have come into contact with a person who is suspected to have coronavirus disease. °? Have traveled outside of the country. °When should I get emergency medical care? °· Get help right away by calling your local emergency services (911 in the [place] if you have: °? Trouble breathing. °? Pain or pressure in your chest. °? Confusion. °? Blue-tinged lips and fingernails. °? Difficulty waking from sleep. °? Symptoms that get worse. °Let the emergency medical personnel know if you think you have coronavirus disease. °Summary °· A new respiratory virus is spreading from person to person and causing COVID-19 (coronavirus disease). °· The virus that causes COVID-19 appears to spread easily. It spreads from one person to another through droplets from coughing or sneezing. °· Older adults and those with chronic diseases are at higher risk of disease. If you are at higher risk for complications, take extra precautions. °· There is currently no vaccine to prevent coronavirus disease. There are no medicines, such as antibiotics or   antivirals, to treat the virus. °· You can protect yourself and your family by washing your hands often, avoiding touching your face, and covering your coughs and sneezes. °This information is not intended to replace advice given to you by your health care provider. Make sure you discuss any questions you have with your health care provider. °Document Released: 03/04/2019 Document Revised: 03/04/2019 Document Reviewed: 03/04/2019 °Elsevier Patient Education © 2020 [employer] ° ° °COVID-19 °COVID-19 is a respiratory infection that is caused by a virus called severe acute respiratory syndrome coronavirus 2 (SARS-CoV-2). The disease is also known as coronavirus disease or novel coronavirus. In some people, the virus  may not cause any symptoms. In others, it may cause a serious infection. The infection can get worse quickly and can lead to complications, such as: °· Pneumonia, or infection of the lungs. °· Acute respiratory distress syndrome or ARDS. This is fluid build-up in the lungs. °· Acute respiratory failure. This is a condition in which there is not enough oxygen passing from the lungs to the body. °· Sepsis or septic shock. This is a serious bodily reaction to an infection. °· Blood clotting problems. °· Secondary infections due to bacteria or fungus. °The virus that causes COVID-19 is contagious. This means that it can spread from person to person through droplets from coughs and sneezes (respiratory secretions). °What are the causes? °This illness is caused by a virus. You may catch the virus by: °· Breathing in droplets from an infected person's cough or sneeze. °· Touching something, like a table or a doorknob, that was exposed to the virus (contaminated) and then touching your mouth, nose, or eyes. °What increases the risk? °Risk for infection °You are more likely to be infected with this virus if you: °· Live in or travel to an area with a COVID-19 outbreak. °· Come in contact with a sick person who recently traveled to an area with a COVID-19 outbreak. °· Provide care for or live with a person who is infected with COVID-19. °Risk for serious illness °You are more likely to become seriously ill from the virus if you: °· Are 65 years of age or older. °· Have a long-term disease that lowers your body's ability to fight infection (immunocompromised). °· Live in a nursing home or long-term care facility. °· Have a long-term (chronic) disease such as: °? Chronic lung disease, including chronic obstructive pulmonary disease or asthma °? Heart disease. °? Diabetes. °? Chronic kidney disease. °? Liver disease. °· Are obese. °What are the signs or symptoms? °Symptoms of this condition can range from mild to severe.  Symptoms may appear any time from 2 to 14 days after being exposed to the virus. They include: °· A fever. °· A cough. °· Difficulty breathing. °· Chills. °· Muscle pains. °· A sore throat. °· Loss of taste or smell. °Some people may also have stomach problems, such as nausea, vomiting, or diarrhea. °Other people may not have any symptoms of COVID-19. °How is this diagnosed? °This condition may be diagnosed based on: °· Your signs and symptoms, especially if: °? You live in an area with a COVID-19 outbreak. °? You recently traveled to or from an area where the virus is common. °? You provide care for or live with a person who was diagnosed with COVID-19. °· A physical exam. °· Lab tests, which may include: °? A nasal swab to take a sample of fluid from your nose. °? A throat swab to take a sample of   fluid from your throat. °? A sample of mucus from your lungs (sputum). °? Blood tests. °· Imaging tests, which may include, X-rays, CT scan, or ultrasound. °How is this treated? °At present, there is no medicine to treat COVID-19. Medicines that treat other diseases are being used on a trial basis to see if they are effective against COVID-19. °Your health care provider will talk with you about ways to treat your symptoms. For most people, the infection is mild and can be managed at home with rest, fluids, and over-the-counter medicines. °Treatment for a serious infection usually takes places in a hospital intensive care unit (ICU). It may include one or more of the following treatments. These treatments are given until your symptoms improve. °· Receiving fluids and medicines through an IV. °· Supplemental oxygen. Extra oxygen is given through a tube in the nose, a face mask, or a hood. °· Positioning you to lie on your stomach (prone position). This makes it easier for oxygen to get into the lungs. °· Continuous positive airway pressure (CPAP) or bi-level positive airway pressure (BPAP) machine. This treatment uses mild  air pressure to keep the airways open. A tube that is connected to a motor delivers oxygen to the body. °· Ventilator. This treatment moves air into and out of the lungs by using a tube that is placed in your windpipe. °· Tracheostomy. This is a procedure to create a hole in the neck so that a breathing tube can be inserted. °· Extracorporeal membrane oxygenation (ECMO). This procedure gives the lungs a chance to recover by taking over the functions of the heart and lungs. It supplies oxygen to the body and removes carbon dioxide. °Follow these instructions at home: °Lifestyle °· If you are sick, stay home except to get medical care. Your health care provider will tell you how long to stay home. Call your health care provider before you go for medical care. °· Rest at home as told by your health care provider. °· Do not use any products that contain nicotine or tobacco, such as cigarettes, e-cigarettes, and chewing tobacco. If you need help quitting, ask your health care provider. °· Return to your normal activities as told by your health care provider. Ask your health care provider what activities are safe for you. °General instructions °· Take over-the-counter and prescription medicines only as told by your health care provider. °· Drink enough fluid to keep your urine pale yellow. °· Keep all follow-up visits as told by your health care provider. This is important. °How is this prevented? ° °There is no vaccine to help prevent COVID-19 infection. However, there are steps you can take to protect yourself and others from this virus. °To protect yourself:  °· Do not travel to areas where COVID-19 is a risk. The areas where COVID-19 is reported change often. To identify high-risk areas and travel restrictions, check the CDC travel website: wwwnc.cdc.gov/travel/notices °· If you live in, or must travel to, an area where COVID-19 is a risk, take precautions to avoid infection. °? Stay away from people who are  sick. °? Wash your hands often with soap and water for 20 seconds. If soap and water are not available, use an alcohol-based hand sanitizer. °? Avoid touching your mouth, face, eyes, or nose. °? Avoid going out in public, follow guidance from your state and local health authorities. °? If you must go out in public, wear a cloth face covering or face mask. °? Disinfect objects and surfaces that are   frequently touched every day. This may include:  Counters and tables.  Doorknobs and light switches.  Sinks and faucets.  Electronics, such as phones, remote controls, keyboards, computers, and tablets. To protect others: If you have symptoms of COVID-19, take steps to prevent the virus from spreading to others.  If you think you have a COVID-19 infection, contact your health care provider right away. Tell your health care team that you think you may have a COVID-19 infection.  Stay home. Leave your house only to seek medical care. Do not use public transport.  Do not travel while you are sick.  Wash your hands often with soap and water for 20 seconds. If soap and water are not available, use alcohol-based hand sanitizer.  Stay away from other members of your household. Let healthy household members care for children and pets, if possible. If you have to care for children or pets, wash your hands often and wear a mask. If possible, stay in your own room, separate from others. Use a different bathroom.  Make sure that all people in your household wash their hands well and often.  Cough or sneeze into a tissue or your sleeve or elbow. Do not cough or sneeze into your hand or into the air.  Wear a cloth face covering or face mask. Where to find more information  Centers for Disease Control and Prevention: PurpleGadgets.be  World Health Organization: https://www.castaneda.info/ Contact a health care provider if:  You live in or have traveled to an area  where COVID-19 is a risk and you have symptoms of the infection.  You have had contact with someone who has COVID-19 and you have symptoms of the infection. Get help right away if:  You have trouble breathing.  You have pain or pressure in your chest.  You have confusion.  You have bluish lips and fingernails.  You have difficulty waking from sleep.  You have symptoms that get worse. These symptoms may represent a serious problem that is an emergency. Do not wait to see if the symptoms will go away. Get medical help right away. Call your local emergency services (911 in the [place] Do not drive yourself to the hospital. Let the emergency medical personnel know if you think you have COVID-19. Summary  COVID-19 is a respiratory infection that is caused by a virus. It is also known as coronavirus disease or novel coronavirus. It can cause serious infections, such as pneumonia, acute respiratory distress syndrome, acute respiratory failure, or sepsis.  The virus that causes COVID-19 is contagious. This means that it can spread from person to person through droplets from coughs and sneezes.  You are more likely to develop a serious illness if you are 45 years of age or older, have a weak immunity, live in a nursing home, or have chronic disease.  There is no medicine to treat COVID-19. Your health care provider will talk with you about ways to treat your symptoms.  Take steps to protect yourself and others from infection. Wash your hands often and disinfect objects and surfaces that are frequently touched every day. Stay away from people who are sick and wear a mask if you are sick. This information is not intended to replace advice given to you by your health care provider. Make sure you discuss any questions you have with your health care provider. Document Released: 12/12/2018 Document Revised: 04/03/2019 Document Reviewed: 12/12/2018 Elsevier Patient Education  2020 [employer].   Follow with [name],  MD in 5-7 days  Please get a complete blood count and chemistry panel checked by your Primary MD at your next visit, and again as instructed by your Primary MD. Please get your medications reviewed and adjusted by your Primary MD.  Please request your Primary MD to go over all Hospital Tests and Procedure/Radiological results at the follow up, please get all Hospital records sent to your Prim MD by signing hospital release before you go home.  In some cases, there will be blood work, cultures and biopsy results pending at the time of your discharge. Please request that your primary care M.D. goes through all the records of your hospital data and follows up on these results.  If you had Pneumonia of Lung problems at the Hospital: Please get a 2 view Chest X ray done in 6-8 weeks after hospital discharge or sooner if instructed by your Primary MD.  If you have Congestive Heart Failure: Please call your Cardiologist or Primary MD anytime you have any of the following symptoms:  1) 3 pound weight gain in 24 hours or 5 pounds in 1 week  2) shortness of breath, with or without a dry hacking cough  3) swelling in the hands, feet or stomach  4) if you have to sleep on extra pillows at night in order to breathe  Follow cardiac low salt diet and 1.5 lit/day fluid restriction.  If you have diabetes Accuchecks 4 times/day, Once in AM empty stomach and then before each meal. Log in all results and show them to your primary doctor at your next visit. If any glucose reading is under 80 or above 300 call your primary MD immediately.  If you have Seizure/Convulsions/Epilepsy: Please do not drive, operate heavy machinery, participate in activities at heights or participate in high speed sports until you have seen by Primary MD or a Neurologist and advised to do so again. Per [facility] statutes, patients with seizures are not allowed to drive until they have  been seizure-free for six months.  Use caution when using heavy equipment or power tools. Avoid working on ladders or at heights. Take showers instead of baths. Ensure the water temperature is not too high on the home water heater. Do not go swimming alone. Do not lock yourself in a room alone (i.e. bathroom). When caring for infants or small children, sit down when holding, feeding, or changing them to minimize risk of injury to the child in the event you have a seizure. Maintain good sleep hygiene. Avoid alcohol.   If you had Gastrointestinal Bleeding: Please ask your Primary MD to check a complete blood count within one week of discharge or at your next visit. Your endoscopic/colonoscopic biopsies that are pending at the time of discharge, will also need to followed by your Primary MD.  Get Medicines reviewed and adjusted. Please take all your medications with you for your next visit with your Primary MD  Please request your Primary MD to go over all hospital tests and procedure/radiological results at the follow up, please ask your Primary MD to get all Hospital records sent to his/her office.  If you experience worsening of your admission symptoms, develop shortness of breath, life threatening emergency, suicidal or homicidal thoughts you must seek medical attention immediately by calling 911 or calling your MD immediately  if symptoms less severe.  You must read complete instructions/literature along with all the possible adverse reactions/side effects for all the Medicines you take and that have been  prescribed to you. Take any new Medicines after you have completely understood and accpet all the possible adverse reactions/side effects.   Do not drive or operate heavy machinery when taking Pain medications.   Do not take more than prescribed Pain, Sleep and Anxiety Medications  Special Instructions: If you have smoked or chewed Tobacco  in the last 2 yrs please stop smoking, stop any  regular Alcohol  and or any Recreational drug use.  Wear Seat belts while driving.  Please note You were cared for by a hospitalist during your hospital stay. If you have any questions about your discharge medications or the care you received while you were in the hospital after you are discharged, you can call the unit and asked to speak with the hospitalist on call if the hospitalist that took care of you is not available. Once you are discharged, your primary care physician will handle any further medical issues. Please note that NO REFILLS for any discharge medications will be authorized once you are discharged, as it is imperative that you return to your primary care physician (or establish a relationship with a primary care physician if you do not have one) for your aftercare needs so that they can reassess your need for medications and monitor your lab values.  You can reach the hospitalist office at phone [phone] or fax [phone]   If you do not have a primary care physician, you can call [phone] for a physician referral.  Activity: As tolerated with Full fall precautions use walker/cane & assistance as needed    Diet: diabetic  Disposition Home

## 2019-06-10 DIAGNOSIS — U071 COVID-19: Secondary | ICD-10-CM

## 2019-06-10 LAB — CBC
HCT: 47.8 % (ref 39.0–52.0)
Hemoglobin: 16 g/dL (ref 13.0–17.0)
MCH: 31 pg (ref 26.0–34.0)
MCHC: 33.5 g/dL (ref 30.0–36.0)
MCV: 92.6 fL (ref 80.0–100.0)
Platelets: 112 10*3/uL — ABNORMAL LOW (ref 150–400)
RBC: 5.16 MIL/uL (ref 4.22–5.81)
RDW: 13.6 % (ref 11.5–15.5)
WBC: 6.1 10*3/uL (ref 4.0–10.5)
nRBC: 0 % (ref 0.0–0.2)

## 2019-06-10 LAB — GLUCOSE, CAPILLARY
Glucose-Capillary: 273 mg/dL — ABNORMAL HIGH (ref 70–99)
Glucose-Capillary: 292 mg/dL — ABNORMAL HIGH (ref 70–99)
Glucose-Capillary: 398 mg/dL — ABNORMAL HIGH (ref 70–99)
Glucose-Capillary: 452 mg/dL — ABNORMAL HIGH (ref 70–99)

## 2019-06-10 LAB — COMPREHENSIVE METABOLIC PANEL
ALT: 28 U/L (ref 0–44)
AST: 37 U/L (ref 15–41)
Albumin: 3.3 g/dL — ABNORMAL LOW (ref 3.5–5.0)
Alkaline Phosphatase: 48 U/L (ref 38–126)
Anion gap: 12 (ref 5–15)
BUN: 21 mg/dL (ref 8–23)
CO2: 26 mmol/L (ref 22–32)
Calcium: 8.8 mg/dL — ABNORMAL LOW (ref 8.9–10.3)
Chloride: 98 mmol/L (ref 98–111)
Creatinine, Ser: 1.05 mg/dL (ref 0.61–1.24)
GFR calc Af Amer: >60 mL/min (ref >60)
GFR calc non Af Amer: >60 mL/min (ref >60)
Glucose, Bld: 191 mg/dL — ABNORMAL HIGH (ref 70–99)
Potassium: 3.2 mmol/L — ABNORMAL LOW (ref 3.5–5.1)
Sodium: 136 mmol/L (ref 135–145)
Total Bilirubin: 0.4 mg/dL (ref 0.3–1.2)
Total Protein: 6.7 g/dL (ref 6.5–8.1)

## 2019-06-10 LAB — FERRITIN: Ferritin: 1058 ng/mL — ABNORMAL HIGH (ref 24–336)

## 2019-06-10 LAB — D-DIMER, QUANTITATIVE: D-Dimer, Quant: 0.5 ug/mL-FEU (ref 0.00–0.50)

## 2019-06-10 LAB — C-REACTIVE PROTEIN: CRP: 4.2 mg/dL — ABNORMAL HIGH (ref <1.0)

## 2019-06-10 MED ORDER — INSULIN GLARGINE 100 UNIT/ML ~~LOC~~ SOLN
10.0000 [IU] | Freq: Two times a day (BID) | SUBCUTANEOUS | Status: DC
  Administered 2019-06-10 – 2019-06-12 (×5): 10 [IU] via SUBCUTANEOUS
  Filled 2019-06-10 (×6): qty 0.1

## 2019-06-10 MED ORDER — SODIUM CHLORIDE 0.9 % IV SOLN
100.0000 mg | INTRAVENOUS | Status: AC
  Administered 2019-06-10 – 2019-06-12 (×3): 100 mg via INTRAVENOUS
  Filled 2019-06-10 (×3): qty 20

## 2019-06-10 MED ORDER — METHYLPREDNISOLONE SODIUM SUCC 125 MG IJ SOLR
60.0000 mg | INTRAMUSCULAR | Status: DC
  Administered 2019-06-10 – 2019-06-11 (×6): 60 mg via INTRAVENOUS
  Filled 2019-06-10 (×6): qty 2

## 2019-06-10 MED ORDER — HYDROCOD POLST-CPM POLST ER 10-8 MG/5ML PO SUER: 5.0000 mL | Freq: Two times a day (BID) | ORAL | Status: DC | PRN

## 2019-06-10 NOTE — Progress Notes (Signed)
MEDICATION RELATED CONSULT NOTE - Follow-up   Pharmacy Consult for Remdesivir Indication: COVID-19  Assessment: 68 yo M presents with cough and diarrhea. Found to have COVID-19. patient received Remdesivir load 7/18 and dose on 7/19 then MD d/c remdesivir 7/20 as they thought pt going home but pt ended up desatting and worsening so now wants to restart Remdesivir to finish course.  ALT remains wnl and pt requiring 2L [place]  Plan:  Give remaining 3 doses of remdesivir to complete total of 5-day course: Remdesivir 100mg  IV x 3 doses Monitor clinical progress and ALT  Sherlon Handing, PharmD, BCPS Clinical pharmacist, phone [phone] 06/10/2019 8:44 AM

## 2019-06-10 NOTE — Evaluation (Signed)
Occupational Therapy Evaluation Patient Details Name: [name] [name] MRN: [id number] DOB: 1951-04-29 Today's Date: 06/10/2019    History of Present Illness [name] [name] is a 68 y.o. male with medical history significant for coronary artery disease, hypertension, obstructive sleep apnea, aortic stenosis, diabetes mellitus, tremor, patient presented to the ED with complaints of cough and wheezing , diarrhea, temp of 103.SARS-CoV-2 test positive.  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 Pt currently requiring Min Guard A for ADLs in standing, LB ADLs, and functional mobility. Pt's SpO2 maintaining >92% on RA (monitored on left ear). Pt would benefit from further acute OT to facilitate safe dc. Recommend dc to home once medically stable per physician.      Follow Up Recommendations  No OT follow up;Supervision/Assistance - 24 hour    Equipment Recommendations  Tub/shower seat    Recommendations for Other Services PT consult     Precautions / Restrictions Precautions Precautions: Fall Precaution Comments: monitor sats Restrictions Weight Bearing Restrictions: No      Mobility Bed Mobility Overal bed mobility: Needs Assistance Bed Mobility: Supine to Sit;Sit to Supine     Supine to sit: Supervision Sit to supine: Supervision   General bed mobility comments: Supervision for safety and extra time  Transfers Overall transfer level: Needs assistance Equipment used: Rolling walker (2 wheeled);None Transfers: Sit to/from Stand Sit to Stand: Min guard         General transfer comment: steady assist    Balance Overall balance assessment: Needs assistance Sitting-balance support: No upper extremity supported;Feet supported Sitting balance-Leahy Scale: Good     Standing balance support: During functional activity;No upper extremity  supported Standing balance-Leahy Scale: Fair Standing balance comment: guarded posture walking without RW                           ADL either performed or assessed with clinical judgement   ADL Overall ADL's : Needs assistance/impaired Eating/Feeding: Independent;Sitting   Grooming: Wash/dry hands;Min guard;Standing Grooming Details (indicate cue type and reason): Min Guard A for safety Upper Body Bathing: Set up;Supervision/ safety;Sitting   Lower Body Bathing: Min guard;Sit to/from stand Lower Body Bathing Details (indicate cue type and reason): Pt performing LB bathing and peri care while at the sink Upper Body Dressing : Set up;Supervision/safety;Sitting Upper Body Dressing Details (indicate cue type and reason): Donning second gown Lower Body Dressing: Min guard;Sit to/from stand Lower Body Dressing Details (indicate cue type and reason): Min Guard A for safety in standing. Pt donning his shoes demonstrating WFL ROM and balance for bending forward Toilet Transfer: Min guard;Ambulation;Regular Toilet;RW   Toileting- Clothing Manipulation and Hygiene: Supervision/safety;Sit to/from stand       Functional mobility during ADLs: Min guard General ADL Comments: Pt demonstrating decreased activity tolerance and balance.      Vision         Perception     Praxis      Pertinent Vitals/Pain Pain Assessment: Faces Faces Pain Scale: Hurts a little bit Pain Location: back Pain Descriptors / Indicators: Aching Pain Intervention(s): Monitored during session;Limited activity within patient's tolerance;Repositioned     Hand Dominance     Extremity/Trunk Assessment Upper Extremity Assessment Upper Extremity Assessment: Generalized weakness;RUE deficits/detail;LUE deficits/detail RUE Deficits / Details: Tremors at rest and with activity. Reports this is baseline RUE Coordination: decreased  fine motor LUE Deficits / Details: Tremors at rest and with activity. reports  this is baseline LUE Coordination: decreased fine motor   Lower Extremity Assessment Lower Extremity Assessment: Defer to PT evaluation   Cervical / Trunk Assessment Cervical / Trunk Assessment: Normal   Communication Communication Communication: No difficulties   Cognition Arousal/Alertness: Awake/alert Behavior During Therapy: WFL for tasks assessed/performed Overall Cognitive Status: Within Functional Limits for tasks assessed                                 General Comments: states  that he has to take care of wife   General Comments  SpO2 >90% on RA. RR 20-30s. HR 120s    Exercises     Shoulder Instructions      Home Living Family/patient expects to be discharged to:: Private residence Living Arrangements: Spouse/significant other Available Help at Discharge: Family Type of Home: House Home Access: Ramped entrance;Stairs to enter [employer] of Steps: 5- easiest entrance, ramp in back Entrance Stairs-Rails: Right Home Layout: One level     Bathroom Shower/Tub: Teacher, early years/pre: Handicapped height     Home Equipment: Grab bars - tub/shower;Cane - single point;Walker - 2 wheels          Prior Functioning/Environment Level of Independence: Independent        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[employer] Problem List: Decreased strength;Decreased range of motion;Decreased activity tolerance;Impaired balance (sitting and/or standing);Decreased knowledge of use of DME or AE;Decreased knowledge of precautions;Impaired UE functional use      OT Treatment/Interventions: Self-care/ADL training;Therapeutic exercise;Energy conservation;DME and/or AE instruction;Therapeutic activities;Patient/family education    OT Goals(Current goals can be found in the care plan section) Acute Rehab OT Goals Patient Stated Goal: to feel better and take care of my wife OT Goal Formulation: With patient Time For Goal Achievement:  06/24/19 Potential to Achieve Goals: Good  OT Frequency: Min 2X/week   Barriers to D/C:            Co-evaluation PT/OT/SLP Co-Evaluation/Treatment: Yes Reason for Co-Treatment: For patient/therapist safety;To address functional/ADL transfers PT goals addressed during session: Mobility/safety with mobility OT goals addressed during session: ADL's and self-care      AM-PAC OT "6 Clicks" Daily Activity     Outcome Measure Help from another person eating meals?: None Help from another person taking care of personal grooming?: A Little Help from another person toileting, which includes using toliet, bedpan, or urinal?: A Little Help from another person bathing (including washing, rinsing, drying)?: A Little Help from another person to put on and taking off regular upper body clothing?: None Help from another person to put on and taking off regular lower body clothing?: A Little 6 Click Score: 20   End of Session Equipment Utilized During Treatment: Gait belt Nurse Communication: Mobility status(Pt requesting cough meds)  Activity Tolerance: Patient tolerated treatment well Patient left: in bed;with call bell/phone within reach  OT Visit Diagnosis: Unsteadiness on feet (R26.81);Other abnormalities of gait and mobility (R26.89);Muscle weakness (generalized) (M62.81)                Time: 1448-1856 OT Time Calculation (min): 32 min Charges:  OT General Charges $OT Visit: 1 Visit OT Evaluation $OT Eval Moderate Complexity: [place], OTR/L Acute Rehab Pager: [phone] Office: [place] 06/10/2019, 10:28 AM

## 2019-06-10 NOTE — Progress Notes (Signed)
Physical Therapy Treatment Patient Details Name: [name] [name] MRN: [id number] DOB: 1951-08-18 Today's Date: 06/10/2019    History of Present Illness DERRYL UHER is a 68 y.o. male with medical history significant for coronary artery disease, hypertension, obstructive sleep apnea, aortic stenosis, diabetes mellitus, tremor, patient presented to the ED with complaints of cough and wheezing , diarrhea, temp of 103.SARS-CoV-2 test positive.  Portable chest x-ray shows left lower lobe opacity    PT Comments    The  patint reports feeling better, still weak and coughing. Patient did ambulate with 1 assist, Sats monitored on  Left ear for a higher reading than finger. sats >92% for activity. Patient is not near his baseline at tghis time and demonstrates decreased balance without RW. Continue PT while in acute care.   Follow Up Recommendations  No PT follow up     Equipment Recommendations  None recommended by PT    Recommendations for Other Services       Precautions / Restrictions Precautions Precautions: Fall Precaution Comments: monitor sats    Mobility  Bed Mobility Overal bed mobility: Needs Assistance Bed Mobility: Supine to Sit     Supine to sit: Supervision     General bed mobility comments: extra time,  Transfers   Equipment used: Rolling walker (2 wheeled);None Transfers: Sit to/from Stand Sit to Stand: Min guard         General transfer comment: steady assist, noted increased tremors  Ambulation/Gait Ambulation/Gait assistance: Min assist;Min guard Gait Distance (Feet): 20 Feet(then 200')   Gait Pattern/deviations: Step-through pattern;Staggering right;Drifts right/left;Decreased stride length;Shuffle Gait velocity: decr Gait velocity interpretation: 1.31 - 2.62 ft/sec, indicative of limited community ambulator General Gait Details: slow speed, 3 episodes of drift to righ. tremors of UE's and slight trunk noted.   Stairs             Wheelchair  Mobility    Modified Rankin (Stroke Patients Only)       Balance Overall balance assessment: Needs assistance Sitting-balance support: No upper extremity supported;Feet supported Sitting balance-Leahy Scale: Good     Standing balance support: During functional activity;No upper extremity supported Standing balance-Leahy Scale: Fair Standing balance comment: guarded posture walking without RW                            Cognition Arousal/Alertness: Awake/alert Behavior During Therapy: WFL for tasks assessed/performed Overall Cognitive Status: Within Functional Limits for tasks assessed                                        Exercises      General Comments        Pertinent Vitals/Pain Faces Pain Scale: Hurts a little bit Pain Location: back Pain Descriptors / Indicators: Aching Pain Intervention(s): Monitored during session    Home Living Family/patient expects to be discharged to:: Private residence             Home Equipment: Grab bars - tub/shower;Cane - single point;Walker - 2 wheels      Prior Function Level of Independence: Independent      Comments: works in Designer, industrial/product Goals (current goals can now be found in the care plan section)      Frequency    Min 3X/week      PT Plan Current plan remains appropriate    Co-evaluation  PT/OT/SLP Co-Evaluation/Treatment: Yes Reason for Co-Treatment: For patient/therapist safety PT goals addressed during session: Mobility/safety with mobility        AM-PAC PT "6 Clicks" Mobility   Outcome Measure  Help needed turning from your back to your side while in a flat bed without using bedrails?: None Help needed moving from lying on your back to sitting on the side of a flat bed without using bedrails?: None Help needed moving to and from a bed to a chair (including a wheelchair)?: A Little Help needed standing up from a chair using your arms (e.g., wheelchair or bedside  chair)?: A Little Help needed to walk in hospital room?: A Little Help needed climbing 3-5 steps with a railing? : A Lot 6 Click Score: 19    End of Session Equipment Utilized During Treatment: Gait belt Activity Tolerance: Patient tolerated treatment well Patient left: in bed Nurse Communication: Mobility status PT Visit Diagnosis: Difficulty in walking, not elsewhere classified (R26.2)     Time: 7062-3762 PT Time Calculation (min) (ACUTE ONLY): 36 min  Charges:  $Gait Training: 8-22 mins                     [place]  Office [phone]    Claretha Cooper 06/10/2019, 9:21 AM

## 2019-06-10 NOTE — Progress Notes (Signed)
Progress Note    [name] [name] QVZ:[id number] DOB: 01-05-1951 DOA: 06/07/2019  PCP: [name], MD   Patient coming from: Home  I have personally briefly reviewed patient's old medical records in [place]  Chief Complaint: Cough, diarrhea  HPI: [name] [name] is a 68 y.o. male with medical history significant for coronary artery disease, hypertension, obstructive sleep apnea, aortic stenosis, diabetes mellitus, tremor, patient presented to the ED with complaints of cough and wheezing over the past week, and diarrhea.  Patient denies difficulty breathing, and his diarrhea has significantly improved..  Patient's wife is also ill with similar symptoms.  Patient had COVID testing done yesterday he is awaiting result.  Temperature up to 104 recorded today at home. EMS reports O2 sats 85% on room air on arrival. Per ED provider temperature of 103 recorded in ED, heart rate 70s to 80s,  intermittent tachypnea to 28, O2 sats on room air greater than 90%.  Systolic blood pressure 329J to 140s.  WBC 3.6.  Mild elevated AST 46.  Mild lactic acidosis 2.1 >> 1.2.  Inflammatory markers checked mildly elevated CRP, LDH and ferritin.  But d-dimer within normal limits 0.37.  SARS-CoV-2 test positive.  Portable chest x-ray shows left lower lobe opacity.  Patient initially started on IV ceftriaxone and azithromycin for localized pneumonia.  Hospitalist to admit for COVID pneumonia.  Interim history: Patient admitted as above with plans of diarrhea cough, not hypoxic, febrile admitted to [facility] urine COVID positive labs.  Patient actually improved quite drastically, diarrhea appears to be somewhat chronic given diverticulosis but again remained hypoxic without symptoms overnight initially from the [place], unfortunately late in the day on the 20th patient became somewhat more dyspneic with exertion, noted to be desaturating into the 80s and initially planned discharge on the 20th was discontinued given  unstable nature, orthostatic positive blood pressure and hypoxia with exertion.  He was placed on dexamethasone and Remdesivir per protocol.  Assessment/Plan   Hospital Problems: Principal Problem:   COVID-19 virus infection Active Problems:   Essential tremor   Viral pneumonia   Chronic diarrhea   COVID-19 infection pneumonia with hypoxia, POA, ongoing- somewhat worsening Reported hypoxia O2 sats 85% on room air prior to arrival Patient ambulated multiple times yesterday without issue until late afternoon notable hypoxia with exertion - now on [place] at rest. Covid labs/markers as below somewhat worsening Chest x-ray finding of ? left lower lobe pneumonia. Procalcitonin negative - hold off on antibiotics at this time. Resume steroids/remdesivir given ongoing hypoxia Zinc and vitamin C daily Recent Labs    06/08/19 0311 06/09/19 0325 06/10/19 0220  DDIMER  --  0.59* 0.50  FERRITIN 928* 1,064* 1,058*  LDH 250*  --   --   CRP 5.3* 4.6* 4.2*   Lab Results  Component Value Date   SARSCOV2NAA POSITIVE (A) 06/07/2019   Thrombocytopenia - likely acute phase reactant in the setting of above, POA -SCDs for now; follow repeat labs - 75 today - no sign/symptoms of bleeding  Diabetes mellitus-non-insulin dependent, uncontrolled -Home medications Metformin only - holding at admission -Hemoglobin A1c 11.7 -Continue sliding scale insulin, hypoglycemic protocol -Resume Lantus at 10 units twice daily and monitor glucose closely  Hypertension, stable -Continue home losartan HCTZ, propranolol, Norvasc  Coronary artery disease  Denies chest pain.  EKG read as atrial fibrillation but actually P waves are present and rate is regular. -Repeat EKG -Continue home statins, zetia, aspirin.  Tremors, essential -Continue home primidone, propranolol  DVT prophylaxis: SCDS for now Code Status: Full Family Communication: None at bedside Disposition Plan: Per rounding team Consults called: None  Admission status: Inpatient, I certify that at the point of admission it is my clinical judgment that the patient will require inpatient hospital care spanning beyond 2 midnights from the point of admission due to high intensity of service, high risk for further deterioration and high frequency of surveillance required. The following factors support the patient status of inpatient: COVID-19 pneumonia, mild hypoxia, with chest x-ray findings requiring steroids are in place medically at this time.  Review of Systems: As per HPI all other systems reviewed and negative.  Past Medical History:  Diagnosis Date  . Coronary artery disease    s/p PCI of LAD w Residual 50-60% RCA  . Diabetes ([place])   . Dilated aortic root ([facility])    31mm by echo 01/2018  . High cholesterol   . History of echocardiogram 06/2011   Normal LVF,mild LVH,trivial TR/MR, mild AS/AI  . History of kidney stones   . Hypertension   . Mild aortic stenosis    mean AV gradient 66mmHg by echo 02/2016  . Tremor     Past Surgical History:  Procedure Laterality Date  . arm surgery Right   . stents - placement     x 2     reports that he has never smoked. He has never used smokeless tobacco. He reports current alcohol use. He reports that he does not use drugs.  Allergies  Allergen Reactions  . Codeine Other (See Comments)    Vertigo and upset stomach    Family History  Problem Relation Age of Onset  . Other Father        unsure of history  . CAD Brother   . Heart failure Mother   . Emphysema Mother   . CAD Mother     Prior to Admission medications   Medication Sig Start Date End Date Taking? Authorizing Provider  allopurinol (ZYLOPRIM) 300 MG tablet Take 1 tablet (300 mg total) by mouth daily. 03/10/15  Yes Turner, Eber Hong, MD  amLODipine (NORVASC) 10 MG tablet Take 1 tablet (10 mg total) by mouth daily. 02/28/17  Yes Sueanne Margarita, MD  aspirin 81 MG tablet Take 81 mg by mouth daily.   Yes [provider]   buPROPion (WELLBUTRIN XL) 300 MG 24 hr tablet Take 300 mg by mouth daily.   Yes [provider]  Cholecalciferol (VITAMIN D) 125 MCG (5000 UT) CAPS Take 1 tablet by mouth daily.   Yes [provider]  ezetimibe (ZETIA) 10 MG tablet TAKE 1 TABLET BY MOUTH DAILY. PLEASE MAKE OVERDUE APPT WITH DR. Radford Pax BEFORE ANYMORE REFILLS 02/10/19  Yes Turner, Eber Hong, MD  indomethacin (INDOCIN) 50 MG capsule Take 50 mg as needed by mouth (w/food - take as directed).    Yes [provider]  losartan-hydrochlorothiazide (HYZAAR) 100-25 MG tablet Take 1 tablet daily by mouth. 09/26/17  Yes Daune Perch, NP  metFORMIN (GLUCOPHAGE) 500 MG tablet Take 500 mg daily by mouth. 09/14/17  Yes [provider]  nitroGLYCERIN (NITROSTAT) 0.4 MG SL tablet Place 1 tablet (0.4 mg total) under the tongue every 5 (five) minutes as needed for chest pain. 05/09/17  Yes Turner, Eber Hong, MD  primidone (MYSOLINE) 50 MG tablet Take 1 tablet (50 mg total) by mouth 3 (three) times daily. 07/04/18  Yes Marcial Pacas, MD  propranolol ER (INDERAL LA) 120 MG 24 hr capsule Take  2 capsules (240 mg total) by mouth daily. 07/04/18  Yes Marcial Pacas, MD  rosuvastatin (CRESTOR) 20 MG tablet Take 1 tablet (20 mg total) by mouth daily. 04/17/18  Yes Simmons, Brittainy M, PA-C  pantoprazole (PROTONIX) 40 MG tablet Take 1 tablet by mouth daily. 03/04/19   [provider]    Physical Exam: Vitals:   06/10/19 0400 06/10/19 0741 06/10/19 0816 06/10/19 0821  BP: 129/79  124/77   Pulse: 62   66  Resp: 17     Temp: 98.4 F (36.9 C) 98.4 F (36.9 C)    TempSrc: Axillary Oral    SpO2: 92%     Weight:      Height:        Constitutional: Appears flushed, calm, comfortable Vitals:   06/10/19 0400 06/10/19 0741 06/10/19 0816 06/10/19 0821  BP: 129/79  124/77   Pulse: 62   66  Resp: 17     Temp: 98.4 F (36.9 C) 98.4 F (36.9 C)    TempSrc: Axillary Oral    SpO2: 92%     Weight:      Height:       Eyes:  PERRL, lids and conjunctivae normal ENMT: Mucous membranes are moist. Posterior pharynx clear of any exudate or lesions. Neck: normal, supple, no masses, no thyromegaly Respiratory: clear to auscultation bilaterally, no wheezing, no crackles. Normal respiratory effort. No accessory muscle use.  Cardiovascular: Regular rate and rhythm, no murmurs / rubs / gallops. No extremity edema. 2+ pedal pulses.   Abdomen: no tenderness, no masses palpated. No hepatosplenomegaly. Bowel sounds positive.  Musculoskeletal: no clubbing / cyanosis. No joint deformity upper and lower extremities. Good ROM, no contractures. Normal muscle tone.  Skin: no rashes, lesions, ulcers. No induration Neurologic: CN 2-12 grossly intact.  Strength 5/5 in all 4.  Psychiatric: Normal judgment and insight. Alert and oriented x 3. Normal mood.   Labs on Admission: I have personally reviewed following labs and imaging studies  CBC: Recent Labs  Lab 06/07/19 1220 06/08/19 0311 06/09/19 0325 06/10/19 0220  WBC 3.6* 3.6* 6.3 6.1  NEUTROABS 2.2 2.4  --   --   HGB 16.6 16.5 15.9 16.0  HCT 48.1 48.7 46.9 47.8  MCV 91.8 94.6 92.3 92.6  PLT 83* 75* 101* 270*   Basic Metabolic Panel: Recent Labs  Lab 06/07/19 1220 06/08/19 0311 06/09/19 0325 06/10/19 0220  NA 134* 137 139 136  K 4.1 3.5 3.4* 3.2*  CL 99 101 102 98  CO2 24 23 25 26   GLUCOSE 290* 358* 115* 191*  BUN 19 19 26* 21  CREATININE 1.16 1.22 1.16 1.05  CALCIUM 9.1 8.6* 9.2 8.8*  MG  --  1.7  --   --   PHOS  --  3.2  --   --    Liver Function Tests: Recent Labs  Lab 06/07/19 1220 06/08/19 0311 06/09/19 0325 06/10/19 0220  AST 46* 42* 37 37  ALT 43 38 33 28  ALKPHOS 53 53 51 48  BILITOT 0.6 0.3 0.7 0.4  PROT 6.8 6.9 6.9 6.7  ALBUMIN 3.7 3.5 3.5 3.3*   Coagulation Profile: Recent Labs  Lab 06/07/19 1220  INR 1.1   Cardiac Enzymes: Recent Labs  Lab 06/07/19 1220  CKTOTAL 49   Anemia Panel: Recent Labs    06/09/19 0325 06/10/19 0220   FERRITIN 1,064* 1,058*    Little Ishikawa MD [facility]  06/10/2019, 3:04 PM

## 2019-06-11 DIAGNOSIS — J1289 Other viral pneumonia: Secondary | ICD-10-CM

## 2019-06-11 DIAGNOSIS — A419 Sepsis, unspecified organism: Secondary | ICD-10-CM

## 2019-06-11 DIAGNOSIS — U071 COVID-19: Principal | ICD-10-CM

## 2019-06-11 LAB — COMPREHENSIVE METABOLIC PANEL
ALT: 28 U/L (ref 0–44)
AST: 33 U/L (ref 15–41)
Albumin: 3.4 g/dL — ABNORMAL LOW (ref 3.5–5.0)
Alkaline Phosphatase: 51 U/L (ref 38–126)
Anion gap: 13 (ref 5–15)
BUN: 30 mg/dL — ABNORMAL HIGH (ref 8–23)
CO2: 25 mmol/L (ref 22–32)
Calcium: 9.2 mg/dL (ref 8.9–10.3)
Chloride: 99 mmol/L (ref 98–111)
Creatinine, Ser: 1.09 mg/dL (ref 0.61–1.24)
GFR calc Af Amer: >60 mL/min (ref >60)
GFR calc non Af Amer: >60 mL/min (ref >60)
Glucose, Bld: 379 mg/dL — ABNORMAL HIGH (ref 70–99)
Potassium: 3.6 mmol/L (ref 3.5–5.1)
Sodium: 137 mmol/L (ref 135–145)
Total Bilirubin: 0.6 mg/dL (ref 0.3–1.2)
Total Protein: 6.8 g/dL (ref 6.5–8.1)

## 2019-06-11 LAB — CBC
HCT: 48.8 % (ref 39.0–52.0)
Hemoglobin: 17 g/dL (ref 13.0–17.0)
MCH: 31.9 pg (ref 26.0–34.0)
MCHC: 34.8 g/dL (ref 30.0–36.0)
MCV: 91.6 fL (ref 80.0–100.0)
Platelets: 122 10*3/uL — ABNORMAL LOW (ref 150–400)
RBC: 5.33 MIL/uL (ref 4.22–5.81)
RDW: 13.5 % (ref 11.5–15.5)
WBC: 4.4 10*3/uL (ref 4.0–10.5)
nRBC: 0 % (ref 0.0–0.2)

## 2019-06-11 LAB — GLUCOSE, CAPILLARY
Glucose-Capillary: 386 mg/dL — ABNORMAL HIGH (ref 70–99)
Glucose-Capillary: 461 mg/dL — ABNORMAL HIGH (ref 70–99)
Glucose-Capillary: 341 mg/dL — ABNORMAL HIGH (ref 70–99)
Glucose-Capillary: 370 mg/dL — ABNORMAL HIGH (ref 70–99)

## 2019-06-11 LAB — FERRITIN: Ferritin: 995 ng/mL — ABNORMAL HIGH (ref 24–336)

## 2019-06-11 LAB — C-REACTIVE PROTEIN: CRP: 4 mg/dL — ABNORMAL HIGH (ref <1.0)

## 2019-06-11 LAB — D-DIMER, QUANTITATIVE: D-Dimer, Quant: 0.56 ug/mL-FEU — ABNORMAL HIGH (ref 0.00–0.50)

## 2019-06-11 MED ORDER — ENOXAPARIN SODIUM 40 MG/0.4ML ~~LOC~~ SOLN
40.0000 mg | SUBCUTANEOUS | Status: DC
  Administered 2019-06-11 – 2019-06-12 (×2): 40 mg via SUBCUTANEOUS
  Filled 2019-06-11 (×2): qty 0.4

## 2019-06-11 MED ORDER — INSULIN ASPART 100 UNIT/ML ~~LOC~~ SOLN
10.0000 [IU] | Freq: Once | SUBCUTANEOUS | Status: AC
  Administered 2019-06-11: 11:00:00 10 [IU] via SUBCUTANEOUS

## 2019-06-11 MED ORDER — METHYLPREDNISOLONE SODIUM SUCC 40 MG IJ SOLR: 40.0000 mg | Freq: Two times a day (BID) | INTRAMUSCULAR | Status: DC

## 2019-06-11 MED ORDER — DEXAMETHASONE 6 MG PO TABS
6.0000 mg | ORAL_TABLET | Freq: Every day | ORAL | Status: DC
  Administered 2019-06-12: 6 mg via ORAL
  Filled 2019-06-11: qty 1

## 2019-06-11 NOTE — Progress Notes (Signed)
Report received from offgoing RN.  Patient was speaking with wife on phone, questions answered at this time.

## 2019-06-11 NOTE — Progress Notes (Signed)
Results for MEHAR, KIRKWOOD (MRN [id number]) as of 06/11/2019 11:44  Ref. Range 06/10/2019 12:25 06/10/2019 16:38 06/10/2019 20:38 06/11/2019 08:30 06/11/2019 10:51  Glucose-Capillary Latest Ref Range: 70 - 99 mg/dL 292 (H) 398 (H) 452 (H) 386 (H) 461 (H)  Noted that blood sugars continue to be greater than 300 mg/dl.   Recommend increasing Lantus to 20 units BID, continue Novolog RESISTANT correction scale TID & HS, and may need to add Novolog 5 units TID with meals.   Harvel Ricks RN BSN CDE Diabetes Coordinator Pager: [phone]  8am-5pm

## 2019-06-11 NOTE — Progress Notes (Signed)
Occupational Therapy Treatment Patient Details Name: [name] [name] MRN: [id number] DOB: April 26, 1951 Today's Date: 06/11/2019    History of present illness [name] [name] is a 68 y.o. male with medical history significant for coronary artery disease, hypertension, obstructive sleep apnea, aortic stenosis, diabetes mellitus, tremor, patient presented to the ED with complaints of cough and wheezing , diarrhea, temp of 103.SARS-CoV-2 test positive.  Portable chest x-ray shows left lower lobe opacity   OT comments  Pt motivated to dc home and verbalized worry/concern about health of his wife. Pt agreeable to education on energy conservation. Provided handout on EC for ADLs/IADLs and reviewed in full. Pt verbalized ways to implement into his daily routine. Continue to recommend dc home per physician. Will continue to follow acutely as admitted.    Follow Up Recommendations  No OT follow up;Supervision/Assistance - 24 hour    Equipment Recommendations  Tub/shower seat    Recommendations for Other Services PT consult    Precautions / Restrictions Precautions Precautions: Fall Precaution Comments: monitor sats       Mobility Bed Mobility                  Transfers                      Balance                                           ADL either performed or assessed with clinical judgement   ADL Overall ADL's : Needs assistance/impaired                                       General ADL Comments: Focused session on education for energy conservation with ADLs and IADLs. Pt verbalized ways he already used techniques and ways to implement into his routine. Also provided techniques for reducing tremors     Vision       Perception     Praxis      Cognition Arousal/Alertness: Awake/alert Behavior During Therapy: WFL for tasks assessed/performed Overall Cognitive Status: Within Functional Limits for tasks assessed                                          Exercises     Shoulder Instructions       General Comments VSS throughout on RA    Pertinent Vitals/ Pain       Pain Assessment: Faces Faces Pain Scale: No hurt Pain Intervention(s): Monitored during session  Home Living                                          Prior Functioning/Environment              Frequency  Min 2X/week        Progress Toward Goals  OT Goals(current goals can now be found in the care plan section)  Progress towards OT goals: Progressing toward goals  Acute Rehab OT Goals Patient Stated Goal: to feel better and take care of my wife OT Goal Formulation: With patient Time For  Goal Achievement: 06/24/19 Potential to Achieve Goals: Good ADL Goals Pt Will Perform Grooming: with modified independence;standing Pt Will Perform Lower Body Dressing: with modified independence;sit to/from stand Pt Will Transfer to Toilet: with modified independence;ambulating;regular height toilet Pt Will Perform Toileting - Clothing Manipulation and hygiene: with modified independence;sit to/from stand;sitting/lateral leans Pt Will Perform Tub/Shower Transfer: Tub transfer;shower seat;ambulating;with modified independence Additional ADL Goal #1: Pt will independently verbalize three energy conservation strategies for ADLs/IADLs  Plan Discharge plan remains appropriate    Co-evaluation                 AM-PAC OT "6 Clicks" Daily Activity     Outcome Measure   Help from another person eating meals?: None Help from another person taking care of personal grooming?: A Little Help from another person toileting, which includes using toliet, bedpan, or urinal?: A Little Help from another person bathing (including washing, rinsing, drying)?: A Little Help from another person to put on and taking off regular upper body clothing?: None Help from another person to put on and taking off regular lower body  clothing?: A Little 6 Click Score: 20    End of Session Equipment Utilized During Treatment: Gait belt  OT Visit Diagnosis: Unsteadiness on feet (R26.81);Other abnormalities of gait and mobility (R26.89);Muscle weakness (generalized) (M62.81)   Activity Tolerance Patient tolerated treatment well   Patient Left in bed;with call bell/phone within reach   Nurse Communication Mobility status        Time: 4827-0786 OT Time Calculation (min): 18 min  Charges: OT General Charges $OT Visit: 1 Visit OT Treatments $Self Care/Home Management : 8-22 mins  [place], OTR/L Acute Rehab Pager: [phone] Office: [place] 06/11/2019, 5:04 PM

## 2019-06-11 NOTE — Progress Notes (Signed)
PROGRESS NOTE  [name] [name] LKT:[id number] DOB: 05/11/1951 DOA: 06/07/2019 PCP: [name], MD   LOS: 4 days   Brief Narrative / Interim history: 68 year old male with history of CAD, HTN, OSA, aortic stenosis, type 2 diabetes mellitus, essential tremor who came into the ED and was admitted to the hospital on 06/07/2019 with GI symptoms/diarrhea along with cough, wheezing, and difficulties breathing.  He was initially placed on Remdesivir and steroids however upon significant improvement these were discontinued and patient was slated for discharge, however became hypoxic again and his discharge was canceled and he was resumed on Remdesivir  Subjective: Feeling well this morning, eating breakfast.  He appreciates that his essential tremor has gotten a lot worse since he has been on steroids.  Denies any shortness of breath.  Assessment & Plan: Principal Problem:   COVID-19 virus infection Active Problems:   Essential tremor   Viral pneumonia   Chronic diarrhea   Principal Problem Acute Hypoxic Respiratory Failure due to Covid-19 Viral Illness -Initially hypoxic but currently now weaned off to room air.  Evaluated by PT on 7/21 and patient able to ambulate maintaining good O2 sats on room air. -Currently on Remdesivir, has 1 more dose left to complete a 5 day course  -COVID inflammatory markers improving -Procalcitonin was negative -Continue steroids, he was on very high dose 60 every 6, converted to Decadron starting tomorrow  COVID-19 Labs  Recent Labs    06/09/19 0325 06/10/19 0220 06/11/19 0332  DDIMER 0.59* 0.50 0.56*  FERRITIN 1,064* 1,058* 995*  CRP 4.6* 4.2* 4.0*    Lab Results  Component Value Date   SARSCOV2NAA POSITIVE (A) 06/07/2019    Active Problems Thrombocytopenia -Likely in the setting of acute illness, platelets gradually improving  Type 2 diabetes mellitus, non-insulin-dependent, with hyperglycemia -At home he is on metformin, here  persistently hyperglycemic likely in the setting of steroids -His hemoglobin A1c was 11.7, patient tells me that at home his sugars are always pretty high -Decrease steroids dose today, will need to go home at least minimally on Lantus  Hypertension -Continue amlodipine, losartan, HCTZ  Hyperlipidemia -Continue statin  CAD -Continue home statins, Zetia, aspirin   Scheduled Meds: . amLODipine  10 mg Oral Daily  . aspirin  81 mg Oral Daily  . buPROPion  300 mg Oral Daily  . [START ON 06/12/2019] dexamethasone  6 mg Oral Daily  . ezetimibe  10 mg Oral Daily  . feeding supplement  1 Container Oral BID BM  . feeding supplement (ENSURE ENLIVE)  237 mL Oral Q24H  . feeding supplement (PRO-STAT SUGAR FREE 64)  30 mL Oral Daily  . losartan  100 mg Oral Daily   And  . hydrochlorothiazide  25 mg Oral Daily  . insulin aspart  0-20 Units Subcutaneous TID WC  . insulin aspart  10 Units Subcutaneous Once  . insulin glargine  10 Units Subcutaneous BID  . multivitamin with minerals  1 tablet Oral Daily  . pantoprazole  40 mg Oral Daily  . primidone  50 mg Oral TID  . propranolol ER  240 mg Oral Daily  . rosuvastatin  20 mg Oral Daily  . vitamin C  1,000 mg Oral Daily  . zinc sulfate  220 mg Oral Daily   Continuous Infusions: . remdesivir 100 mg in NS 250 mL 100 mg (06/11/19 1031)   PRN Meds:.acetaminophen **OR** acetaminophen, albuterol, chlorpheniramine-HYDROcodone, guaiFENesin-dextromethorphan, ondansetron **OR** ondansetron (ZOFRAN) IV, polyethylene glycol  DVT prophylaxis: Lovenox Code Status: Full  code Family Communication: d/w patient  Disposition Plan: home when ready   Consultants:   None   Procedures:   None   Antimicrobials:  None    Objective: Vitals:   06/10/19 1720 06/10/19 2100 06/11/19 0415 06/11/19 0819  BP: (!) 112/53 132/78 116/79 (!) 116/53  Pulse: 65 62 63 66  Resp: 18  18 20   Temp: (!) 97.4 F (36.3 C) 97.6 F (36.4 C) 97.9 F (36.6 C) (!) 97 F  (36.1 C)  TempSrc: Oral Oral Oral Axillary  SpO2: 96% 95% 93% 92%  Weight:      Height:        Intake/Output Summary (Last 24 hours) at 06/11/2019 1103 Last data filed at 06/11/2019 1000 Gross per 24 hour  Intake 1160 ml  Output 1050 ml  Net 110 ml   Filed Weights   06/07/19 1158 06/07/19 2014  Weight: 72.6 kg 96.6 kg    Examination:  Constitutional: NAD Eyes: PERRL, lids and conjunctivae normal ENMT: Mucous membranes are moist. Neck: normal, supple Respiratory: clear to auscultation bilaterally, no wheezing, no crackles. Normal respiratory effort. No accessory muscle use.  Cardiovascular: Regular rate and rhythm, no murmurs / rubs / gallops.  Abdomen: no tenderness. Bowel sounds positive.  Musculoskeletal: no clubbing / cyanosis. No joint deformity upper and lower extremities.  Skin: no rashes Neurologic: Tremor at rest as well as intentional, CN 2-12 grossly intact. Strength 5/5 in all 4.  Psychiatric: Normal judgment and insight. Alert and oriented x 3. Normal mood.    Data Reviewed: I have independently reviewed following labs and imaging studies   CBC: Recent Labs  Lab 06/07/19 1220 06/08/19 0311 06/09/19 0325 06/10/19 0220 06/11/19 0332  WBC 3.6* 3.6* 6.3 6.1 4.4  NEUTROABS 2.2 2.4  --   --   --   HGB 16.6 16.5 15.9 16.0 17.0  HCT 48.1 48.7 46.9 47.8 48.8  MCV 91.8 94.6 92.3 92.6 91.6  PLT 83* 75* 101* 112* 505*   Basic Metabolic Panel: Recent Labs  Lab 06/07/19 1220 06/08/19 0311 06/09/19 0325 06/10/19 0220 06/11/19 0332  NA 134* 137 139 136 137  K 4.1 3.5 3.4* 3.2* 3.6  CL 99 101 102 98 99  CO2 24 23 25 26 25   GLUCOSE 290* 358* 115* 191* 379*  BUN 19 19 26* 21 30*  CREATININE 1.16 1.22 1.16 1.05 1.09  CALCIUM 9.1 8.6* 9.2 8.8* 9.2  MG  --  1.7  --   --   --   PHOS  --  3.2  --   --   --    GFR: Estimated Creatinine Clearance: 75.6 mL/min (by C-G formula based on SCr of 1.09 mg/dL). Liver Function Tests: Recent Labs  Lab 06/07/19 1220  06/08/19 0311 06/09/19 0325 06/10/19 0220 06/11/19 0332  AST 46* 42* 37 37 33  ALT 43 38 33 28 28  ALKPHOS 53 53 51 48 51  BILITOT 0.6 0.3 0.7 0.4 0.6  PROT 6.8 6.9 6.9 6.7 6.8  ALBUMIN 3.7 3.5 3.5 3.3* 3.4*   No results for input(s): LIPASE, AMYLASE in the last 168 hours. No results for input(s): AMMONIA in the last 168 hours. Coagulation Profile: Recent Labs  Lab 06/07/19 1220  INR 1.1   Cardiac Enzymes: Recent Labs  Lab 06/07/19 1220  CKTOTAL 49   BNP (last 3 results) No results for input(s): PROBNP in the last 8760 hours. HbA1C: No results for input(s): HGBA1C in the last 72 hours. CBG: Recent Labs  Lab  06/10/19 0723 06/10/19 1225 06/10/19 1638 06/10/19 2038 06/11/19 0830  GLUCAP 273* 292* 398* 452* 386*   Lipid Profile: No results for input(s): CHOL, HDL, LDLCALC, TRIG, CHOLHDL, LDLDIRECT in the last 72 hours. Thyroid Function Tests: No results for input(s): TSH, T4TOTAL, FREET4, T3FREE, THYROIDAB in the last 72 hours. Anemia Panel: Recent Labs    06/10/19 0220 06/11/19 0332  FERRITIN 1,058* 995*   Urine analysis:    Component Value Date/Time   COLORURINE YELLOW 03/23/2010 [place] 03/23/2010 0953   LABSPEC 1.020 03/23/2010 0953   PHURINE 7.0 03/23/2010 0953   GLUCOSEU NEGATIVE 03/23/2010 0953   HGBUR NEGATIVE 03/23/2010 0953   BILIRUBINUR NEGATIVE 03/23/2010 0953   KETONESUR NEGATIVE 03/23/2010 0953   PROTEINUR NEGATIVE 03/23/2010 0953   UROBILINOGEN 1.0 03/23/2010 0953   NITRITE NEGATIVE 03/23/2010 0953   LEUKOCYTESUR  03/23/2010 0953    NEGATIVE MICROSCOPIC NOT DONE ON URINES WITH NEGATIVE PROTEIN, BLOOD, LEUKOCYTES, NITRITE, OR GLUCOSE <1000 mg/dL.   Sepsis Labs: Invalid input(s): PROCALCITONIN, LACTICIDVEN  Recent Results (from the past 240 hour(s))  Blood Culture (routine x 2)     Status: None (Preliminary result)   Collection Time: 06/07/19 12:20 PM   Specimen: BLOOD RIGHT ARM  Result Value Ref Range Status    Specimen Description BLOOD RIGHT ARM  Final   Special Requests   Final    BOTTLES DRAWN AEROBIC AND ANAEROBIC Blood Culture adequate volume   Culture   Final    NO GROWTH 4 DAYS Performed at [facility], [address], [place], [place] [address]    Report Status PENDING  Incomplete  SARS Coronavirus 2 (CEPHEID- Performed in [place] hospital lab), Hosp Order     Status: Abnormal   Collection Time: 06/07/19 12:20 PM   Specimen: Nasopharyngeal Swab  Result Value Ref Range Status   SARS Coronavirus 2 POSITIVE (A) NEGATIVE Final    Comment: RESULT CALLED TO, READ BACK BY AND VERIFIED WITH: D. MARTIN @1450  07/18/2020KAY (NOTE) If result is NEGATIVE SARS-CoV-2 target nucleic acids are NOT DETECTED. The SARS-CoV-2 RNA is generally detectable in upper and lower  respiratory specimens during the acute phase of infection. The lowest  concentration of SARS-CoV-2 viral copies this assay can detect is 250  copies / mL. A negative result does not preclude SARS-CoV-2 infection  and should not be used as the sole basis for treatment or other  patient management decisions.  A negative result may occur with  improper specimen collection / handling, submission of specimen other  than nasopharyngeal swab, presence of viral mutation(s) within the  areas targeted by this assay, and inadequate number of viral copies  (<250 copies / mL). A negative result must be combined with clinical  observations, patient history, and epidemiological information. If result is POSITIVE SARS-CoV-2 target nucleic acids are DETECTED. The SAR S-CoV-2 RNA is generally detectable in upper and lower  respiratory specimens during the acute phase of infection.  Positive  results are indicative of active infection with SARS-CoV-2.  Clinical  correlation with patient history and other diagnostic information is  necessary to determine patient infection status.  Positive results do  not rule out bacterial infection or  co-infection with other viruses. If result is PRESUMPTIVE POSTIVE SARS-CoV-2 nucleic acids MAY BE PRESENT.   A presumptive positive result was obtained on the submitted specimen  and confirmed on repeat testing.  While 2019 novel coronavirus  (SARS-CoV-2) nucleic acids may be present in the submitted sample  additional confirmatory  testing may be necessary for epidemiological  and / or clinical management purposes  to differentiate between  SARS-CoV-2 and other Sarbecovirus currently known to infect humans.  If clinically indicated additional testing with an alternate test  methodology [phone]) is advis ed. The SARS-CoV-2 RNA is generally  detectable in upper and lower respiratory specimens during the acute  phase of infection. The expected result is Negative. Fact Sheet for Patients:  StrictlyIdeas.no Fact Sheet for Healthcare Providers: BankingDealers.co.za This test is not yet approved or cleared by the [place] FDA and has been authorized for detection and/or diagnosis of SARS-CoV-2 by FDA under an Emergency Use Authorization (EUA).  This EUA will remain in effect (meaning this test can be used) for the duration of the COVID-19 declaration under Section 564(b)(1) of the Act, 21 U.S.C. section 360bbb-3(b)(1), unless the authorization is terminated or revoked sooner. Performed at [facility], [address] [place], [place] [address]   Blood Culture (routine x 2)     Status: None (Preliminary result)   Collection Time: 06/07/19 12:50 PM   Specimen: BLOOD RIGHT HAND  Result Value Ref Range Status   Specimen Description BLOOD RIGHT HAND BOTTLES DRAWN AEROBIC ONLY  Final   Special Requests Blood Culture adequate volume  Final   Culture   Final    NO GROWTH 4 DAYS Performed at [facility], [address], [place], [place] [address]    Report Status PENDING  Incomplete      Radiology Studies: No results found.   Marzetta Board, MD, PhD [facility]  Contact via  [email or web address]  [place] P: [phone] F: [phone]

## 2019-06-12 LAB — COMPREHENSIVE METABOLIC PANEL
ALT: 25 U/L (ref 0–44)
AST: 32 U/L (ref 15–41)
Albumin: 3.4 g/dL — ABNORMAL LOW (ref 3.5–5.0)
Alkaline Phosphatase: 57 U/L (ref 38–126)
Anion gap: 15 (ref 5–15)
BUN: 34 mg/dL — ABNORMAL HIGH (ref 8–23)
CO2: 26 mmol/L (ref 22–32)
Calcium: 9.1 mg/dL (ref 8.9–10.3)
Chloride: 94 mmol/L — ABNORMAL LOW (ref 98–111)
Creatinine, Ser: 1.12 mg/dL (ref 0.61–1.24)
GFR calc Af Amer: >60 mL/min (ref >60)
GFR calc non Af Amer: >60 mL/min (ref >60)
Glucose, Bld: 290 mg/dL — ABNORMAL HIGH (ref 70–99)
Potassium: 3.3 mmol/L — ABNORMAL LOW (ref 3.5–5.1)
Sodium: 135 mmol/L (ref 135–145)
Total Bilirubin: 0.6 mg/dL (ref 0.3–1.2)
Total Protein: 6.8 g/dL (ref 6.5–8.1)

## 2019-06-12 LAB — CBC
HCT: 47.4 % (ref 39.0–52.0)
Hemoglobin: 16.3 g/dL (ref 13.0–17.0)
MCH: 31.6 pg (ref 26.0–34.0)
MCHC: 34.4 g/dL (ref 30.0–36.0)
MCV: 91.9 fL (ref 80.0–100.0)
Platelets: 154 10*3/uL (ref 150–400)
RBC: 5.16 MIL/uL (ref 4.22–5.81)
RDW: 13.4 % (ref 11.5–15.5)
WBC: 9 10*3/uL (ref 4.0–10.5)
nRBC: 0 % (ref 0.0–0.2)

## 2019-06-12 LAB — GLUCOSE, CAPILLARY
Glucose-Capillary: 279 mg/dL — ABNORMAL HIGH (ref 70–99)
Glucose-Capillary: 357 mg/dL — ABNORMAL HIGH (ref 70–99)
Glucose-Capillary: 248 mg/dL — ABNORMAL HIGH (ref 70–99)

## 2019-06-12 LAB — CULTURE, BLOOD (ROUTINE X 2)
Culture: NO GROWTH
Culture: NO GROWTH
Special Requests: ADEQUATE
Special Requests: ADEQUATE

## 2019-06-12 LAB — FERRITIN: Ferritin: 1172 ng/mL — ABNORMAL HIGH (ref 24–336)

## 2019-06-12 LAB — D-DIMER, QUANTITATIVE: D-Dimer, Quant: 0.39 ug/mL-FEU (ref 0.00–0.50)

## 2019-06-12 LAB — C-REACTIVE PROTEIN: CRP: 1.7 mg/dL — ABNORMAL HIGH (ref <1.0)

## 2019-06-12 MED ORDER — DEXAMETHASONE 6 MG PO TABS: 6.0000 mg | ORAL_TABLET | Freq: Every day | ORAL | 0 refills | Status: DC

## 2019-06-12 MED ORDER — FREESTYLE LITE DEVI: 0 refills | Status: AC

## 2019-06-12 MED ORDER — INSULIN GLARGINE 100 UNIT/ML SOLOSTAR PEN: 10.0000 [IU] | PEN_INJECTOR | Freq: Every day | SUBCUTANEOUS | 1 refills | Status: AC

## 2019-06-12 MED ORDER — POTASSIUM CHLORIDE CRYS ER 20 MEQ PO TBCR
30.0000 meq | EXTENDED_RELEASE_TABLET | ORAL | Status: AC
  Administered 2019-06-12 (×2): 30 meq via ORAL
  Filled 2019-06-12 (×2): qty 2

## 2019-06-12 MED ORDER — GUAIFENESIN-DM 100-10 MG/5ML PO SYRP: 10.0000 mL | ORAL_SOLUTION | ORAL | 0 refills | Status: DC | PRN

## 2019-06-12 MED ORDER — INSULIN GLARGINE 100 UNIT/ML SOLOSTAR PEN: 10.0000 [IU] | PEN_INJECTOR | Freq: Every day | SUBCUTANEOUS | 2 refills | Status: DC

## 2019-06-12 NOTE — Progress Notes (Signed)
Occupational Therapy Treatment Patient Details Name: [name] [name] MRN: [id number] DOB: 1951-09-27 Today's Date: 06/12/2019    History of present illness [name] [name] is a 68 y.o. male with medical history significant for coronary artery disease, hypertension, obstructive sleep apnea, aortic stenosis, diabetes mellitus, tremor, patient presented to the ED with complaints of cough and wheezing , diarrhea, temp of 103.SARS-CoV-2 test positive.  Portable chest x-ray shows left lower lobe opacity   OT comments  Pt presents supine in bed, reports feeling anxious about returning home and about his wife who is also currently sick. Focus of session on review of energy conservation strategies provided in previous treatment session for use during functional tasks. Pt able to recall some techniques without cues, further reviewed/reinforced additional strategies. Additional focus on fine motor/coordination HEP as pt continues to have bil UE tremors (reportsthese  have improved since initial admission). Issued HEP and pt return demonstrating/verbalizing understanding. Reports he has been getting up to bathroom without difficulty today. Pt on RA throughout session with O2 sats maintaining >95%. Will continue per POC at this time.    Follow Up Recommendations  No OT follow up;Supervision/Assistance - 24 hour    Equipment Recommendations  Tub/shower seat          Precautions / Restrictions Precautions Precautions: Fall       Mobility Bed Mobility Overal bed mobility: Independent                Transfers Overall transfer level: Independent                    Balance     Sitting balance-Leahy Scale: Good       Standing balance-Leahy Scale: Fair Standing balance comment: guarded posture walking without RW                           ADL either performed or assessed with clinical judgement   ADL Overall ADL's : Needs assistance/impaired                                        General ADL Comments: pt reports he has been performing toileting ADL on his own today without difficulty; session focus today on review of energy conservation strategies for use during daily activities; provided pt fine motor/coordination HEP and practiced/reviewed as well as RN reports pt will have to manage his own insulin after discharge which is new to him and given pt baseline tremors in bil UEs     Vision       Perception     Praxis      Cognition Arousal/Alertness: Awake/alert Behavior During Therapy: WFL for tasks assessed/performed Overall Cognitive Status: Within Functional Limits for tasks assessed                                          Exercises Exercises: Other exercises Other Exercises Other Exercises: issued fine motor HEP and reviewed with pt; pt return demonstrating with min cues    Shoulder Instructions       General Comments      Pertinent Vitals/ Pain       Pain Assessment: No/denies pain  Home Living  Prior Functioning/Environment              Frequency  Min 2X/week        Progress Toward Goals  OT Goals(current goals can now be found in the care plan section)  Progress towards OT goals: Progressing toward goals  Acute Rehab OT Goals Patient Stated Goal: to feel better and take care of my wife OT Goal Formulation: With patient Time For Goal Achievement: 06/24/19 Potential to Achieve Goals: Good ADL Goals Pt Will Perform Grooming: with modified independence;standing Pt Will Perform Lower Body Dressing: with modified independence;sit to/from stand Pt Will Transfer to Toilet: with modified independence;ambulating;regular height toilet Pt Will Perform Toileting - Clothing Manipulation and hygiene: with modified independence;sit to/from stand;sitting/lateral leans Pt Will Perform Tub/Shower Transfer: Tub transfer;shower  seat;ambulating;with modified independence Additional ADL Goal #1: Pt will independently verbalize three energy conservation strategies for ADLs/IADLs  Plan Discharge plan remains appropriate    Co-evaluation                 AM-PAC OT "6 Clicks" Daily Activity     Outcome Measure   Help from another person eating meals?: None Help from another person taking care of personal grooming?: A Little Help from another person toileting, which includes using toliet, bedpan, or urinal?: A Little Help from another person bathing (including washing, rinsing, drying)?: A Little Help from another person to put on and taking off regular upper body clothing?: None Help from another person to put on and taking off regular lower body clothing?: A Little 6 Click Score: 20    End of Session    OT Visit Diagnosis: Unsteadiness on feet (R26.81);Other abnormalities of gait and mobility (R26.89);Muscle weakness (generalized) (M62.81)   Activity Tolerance Patient tolerated treatment well   Patient Left in bed;with call bell/phone within reach   Nurse Communication Mobility status        Time: 1446-1510 OT Time Calculation (min): 24 min  Charges: OT General Charges $OT Visit: 1 Visit OT Treatments $Therapeutic Activity: 8-22 mins  Lou Cal, OT [facility] Pager [phone] Office [phone]   [name] [name] 06/12/2019, 4:18 PM

## 2019-06-12 NOTE — Progress Notes (Signed)
Spoke with patient wife, [name] [name]. Update given, questions answered. Spoke with wife about possibility of patient being discharged on new diabetes medications/monitoring devices.

## 2019-06-12 NOTE — Progress Notes (Signed)
[name] [name], case manager called letting RN know that [facility] has sent all Rx to pt normal pharmacy ([facility]).

## 2019-06-12 NOTE — Progress Notes (Signed)
Discharge instructions given; questions answered. IV removed.

## 2019-06-12 NOTE — Discharge Summary (Addendum)
Physician Discharge Summary  DEMONT [name] BBC:[id number] DOB: 1951/09/08 DOA: 06/07/2019  PCP: [name], MD  Admit date: 06/07/2019 Discharge date: 06/12/2019  Admitted From: home Disposition:  home  Recommendations for Outpatient Follow-up:  1. Follow up with PCP in 1-2 weeks  Home Health: none Equipment/Devices: none  Discharge Condition: stable CODE STATUS: Full code Diet recommendation: diabetic   HPI: Per admitting MD, [name] [name] is a 68 y.o. male with medical history significant for coronary artery disease, hypertension, obstructive sleep apnea, aortic stenosis, diabetes mellitus, tremor, patient presented to the ED with complaints of cough and wheezing over the past week, and diarrhea.  Patient denies difficulty breathing, and his diarrhea has significantly improved..  Patient's wife is also ill with similar symptoms.  Patient had COVID testing done yesterday he is awaiting result.  Temperature up to 104 recorded today at home. EMS reports O2 sats 85% on room air on arrival. ED Course: Per ED provider temperature of 103 recorded in ED, heart rate 70s to 80s,  intermittent tachypnea to 28, O2 sats on room air greater than 90%.  Systolic blood pressure 503U to 140s.  WBC 3.6.  Mild elevated AST 46.  Mild lactic acidosis 2.1 >> 1.2.  Inflammatory markers checked mildly elevated CRP, LDH and ferritin.  But d-dimer within normal limits 0.37.  SARS-CoV-2 test positive.  Portable chest x-ray shows left lower lobe opacity.  Patient initially started on IV ceftriaxone and azithromycin for localized pneumonia.  Hospitalist to admit for COVID pneumonia.  Hospital Course: Principal Problem Acute Hypoxic Respiratory Failure due to Covid-19 Viral Illness -patient was admitted to the hospital with hypoxic respiratory failure, he was initially placed on Remdesivir along with steroids, however upon rapid improvement and desire to go home he was discharged, however became hypoxic again  and his treatment was resumed.  Eventually improved, he has been weaned off to room air, able to work with PT with good O2 sats and no home needs.  He has finished Remdesivir while hospitalized.  He will be discharged on Decadron for 4 additional days to complete a 10-day course.  Active Problems Thrombocytopenia -Likely in the setting of acute illness, platelets have normalized Type 2 diabetes mellitus, non-insulin-dependent, with hyperglycemia -patient's hemoglobin A1c was 11.7 showing poor control at home.  His essential tremor makes difficult insulin administration several times daily, he was also unable to withdraw insulin from a vial.  He is able to give himself shots so he will be placed on long-acting Lantus (insulin pen) to start with, he was advised to continue monitoring his CBGs and follow-up with PCP within 1 to 2 weeks following discharge to reevaluate and see additional needs.  He will be coming off of steroids in 4 days so I expect that will improve his CBGs as well Hypertension -Continue home medications Hyperlipidemia -Continue statin CAD -Continue home statins, Zetia, aspirin Essential tremors-chronic, felt like they have gotten worse while he was on IV steroids but now back to baseline   Discharge Diagnoses:  Principal Problem:   COVID-19 virus infection Active Problems:   Essential tremor   Viral pneumonia   Chronic diarrhea   Discharge Instructions  Discharge Instructions    Diet - low sodium heart healthy   Complete by: As directed    Increase activity slowly   Complete by: As directed      Allergies as of 06/12/2019      Reactions   Codeine Other (See Comments)   Vertigo and  upset stomach      Medication List    STOP taking these medications   indomethacin 50 MG capsule Commonly known as: INDOCIN     TAKE these medications   allopurinol 300 MG tablet Commonly known as: ZYLOPRIM Take 1 tablet (300 mg total) by mouth daily.   amLODipine 10 MG tablet  Commonly known as: NORVASC Take 1 tablet (10 mg total) by mouth daily.   aspirin 81 MG tablet Take 81 mg by mouth daily.   buPROPion 300 MG 24 hr tablet Commonly known as: WELLBUTRIN XL Take 300 mg by mouth daily.   dexamethasone 6 MG tablet Commonly known as: DECADRON Take 1 tablet (6 mg total) by mouth daily. Start taking on: June 13, 2019   ezetimibe 10 MG tablet Commonly known as: ZETIA TAKE 1 TABLET BY MOUTH DAILY. PLEASE MAKE OVERDUE APPT WITH DR. Radford Pax BEFORE ANYMORE REFILLS   FreeStyle Lite Devi Please dispense continuous glucose monitoring system thank you   guaiFENesin-dextromethorphan 100-10 MG/5ML syrup Commonly known as: ROBITUSSIN DM Take 10 mLs by mouth every 4 (four) hours as needed for cough.   Insulin Glargine 100 UNIT/ML Solostar Pen Commonly known as: LANTUS Inject 10 Units into the skin daily.   losartan-hydrochlorothiazide 100-25 MG tablet Commonly known as: Hyzaar Take 1 tablet daily by mouth.   metFORMIN 500 MG tablet Commonly known as: GLUCOPHAGE Take 500 mg daily by mouth.   nitroGLYCERIN 0.4 MG SL tablet Commonly known as: NITROSTAT Place 1 tablet (0.4 mg total) under the tongue every 5 (five) minutes as needed for chest pain.   pantoprazole 40 MG tablet Commonly known as: PROTONIX Take 1 tablet by mouth daily.   primidone 50 MG tablet Commonly known as: MYSOLINE Take 1 tablet (50 mg total) by mouth 3 (three) times daily.   propranolol ER 120 MG 24 hr capsule Commonly known as: Inderal LA Take 2 capsules (240 mg total) by mouth daily.   rosuvastatin 20 MG tablet Commonly known as: CRESTOR Take 1 tablet (20 mg total) by mouth daily.   Vitamin D 125 MCG (5000 UT) Caps Take 1 tablet by mouth daily.            Durable Medical Equipment  (From admission, onward)         Start     Ordered   06/08/19 1152  For home use only DME Pulse oximeter  Once     06/08/19 1152         Follow-up Information    [name], MD. Schedule an appointment as soon as possible for a visit in 1 week(s).   Specialty: Family Medicine Why: to check on your home sugars Contact information: [address] [place] [address] [phone]         Consultations:  None   Procedures/Studies:  Dg Chest Port 1 View  Result Date: 06/07/2019 CLINICAL DATA:  Cough, shortness of breath EXAM: PORTABLE CHEST 1 VIEW COMPARISON:  03/23/2010 FINDINGS: The heart size and mediastinal contours are within normal limits. Relatively low volume AP portable examination with heterogeneous opacity of the left lung base. The visualized skeletal structures are unremarkable. IMPRESSION: Relatively low volume AP portable examination with heterogeneous opacity of the left lung base, concerning for infection or aspiration. PA and lateral radiographs may be helpful to more clearly evaluate. Electronically Signed   By: Eddie Candle M.D.   On: 06/07/2019 13:07    Subjective: - no chest pain, shortness of breath, no abdominal pain,  nausea or vomiting.   Discharge Exam: BP 109/74 (BP Location: Left Arm)   Pulse 61   Temp 98.1 F (36.7 C) (Oral)   Resp 20   Ht 5\' 10"  (1.778 m)   Wt 96.6 kg   SpO2 95%   BMI 30.56 kg/m   General: Pt is alert, awake, not in acute distress Cardiovascular: RRR, S1/S2 +, no rubs, no gallops Respiratory: CTA bilaterally, no wheezing, no rhonchi Abdominal: Soft, NT, ND, bowel sounds + Extremities: no edema, no cyanosis    The results of significant diagnostics from this hospitalization (including imaging, microbiology, ancillary and laboratory) are listed below for reference.     Microbiology: Recent Results (from the past 240 hour(s))  Blood Culture (routine x 2)     Status: None   Collection Time: 06/07/19 12:20 PM   Specimen: BLOOD RIGHT ARM  Result Value Ref Range Status   Specimen Description BLOOD RIGHT ARM  Final   Special Requests   Final    BOTTLES DRAWN AEROBIC AND ANAEROBIC Blood  Culture adequate volume   Culture   Final    NO GROWTH 5 DAYS Performed at [facility], [address] [place], [place] [address]    Report Status 06/12/2019 FINAL  Final  SARS Coronavirus 2 (CEPHEID- Performed in [place] hospital lab), Hosp Order     Status: Abnormal   Collection Time: 06/07/19 12:20 PM   Specimen: Nasopharyngeal Swab  Result Value Ref Range Status   SARS Coronavirus 2 POSITIVE (A) NEGATIVE Final    Comment: RESULT CALLED TO, READ BACK BY AND VERIFIED WITH: D. MARTIN @1450  07/18/2020KAY (NOTE) If result is NEGATIVE SARS-CoV-2 target nucleic acids are NOT DETECTED. The SARS-CoV-2 RNA is generally detectable in upper and lower  respiratory specimens during the acute phase of infection. The lowest  concentration of SARS-CoV-2 viral copies this assay can detect is 250  copies / mL. A negative result does not preclude SARS-CoV-2 infection  and should not be used as the sole basis for treatment or other  patient management decisions.  A negative result may occur with  improper specimen collection / handling, submission of specimen other  than nasopharyngeal swab, presence of viral mutation(s) within the  areas targeted by this assay, and inadequate number of viral copies  (<250 copies / mL). A negative result must be combined with clinical  observations, patient history, and epidemiological information. If result is POSITIVE SARS-CoV-2 target nucleic acids are DETECTED. The SAR S-CoV-2 RNA is generally detectable in upper and lower  respiratory specimens during the acute phase of infection.  Positive  results are indicative of active infection with SARS-CoV-2.  Clinical  correlation with patient history and other diagnostic information is  necessary to determine patient infection status.  Positive results do  not rule out bacterial infection or co-infection with other viruses. If result is PRESUMPTIVE POSTIVE SARS-CoV-2 nucleic acids MAY BE PRESENT.   A  presumptive positive result was obtained on the submitted specimen  and confirmed on repeat testing.  While 2019 novel coronavirus  (SARS-CoV-2) nucleic acids may be present in the submitted sample  additional confirmatory testing may be necessary for epidemiological  and / or clinical management purposes  to differentiate between  SARS-CoV-2 and other Sarbecovirus currently known to infect humans.  If clinically indicated additional testing with an alternate test  methodology [phone]) is advis ed. The SARS-CoV-2 RNA is generally  detectable in upper and lower respiratory specimens during the acute  phase of infection. The  expected result is Negative. Fact Sheet for Patients:  StrictlyIdeas.no Fact Sheet for Healthcare Providers: BankingDealers.co.za This test is not yet approved or cleared by the [place] FDA and has been authorized for detection and/or diagnosis of SARS-CoV-2 by FDA under an Emergency Use Authorization (EUA).  This EUA will remain in effect (meaning this test can be used) for the duration of the COVID-19 declaration under Section 564(b)(1) of the Act, 21 U.S.C. section 360bbb-3(b)(1), unless the authorization is terminated or revoked sooner. Performed at [facility], [address], [place], [place] [address]   Blood Culture (routine x 2)     Status: None   Collection Time: 06/07/19 12:50 PM   Specimen: BLOOD RIGHT HAND  Result Value Ref Range Status   Specimen Description BLOOD RIGHT HAND BOTTLES DRAWN AEROBIC ONLY  Final   Special Requests Blood Culture adequate volume  Final   Culture   Final    NO GROWTH 5 DAYS Performed at [facility], [address] [place], [place] [address]    Report Status 06/12/2019 FINAL  Final     Labs: BNP (last 3 results) No results for input(s): BNP in the last 8760 hours. Basic Metabolic Panel: Recent Labs  Lab 06/08/19 0311 06/09/19 0325 06/10/19 0220 06/11/19 0332  06/12/19 0445  NA 137 139 136 137 135  K 3.5 3.4* 3.2* 3.6 3.3*  CL 101 102 98 99 94*  CO2 23 25 26 25 26   GLUCOSE 358* 115* 191* 379* 290*  BUN 19 26* 21 30* 34*  CREATININE 1.22 1.16 1.05 1.09 1.12  CALCIUM 8.6* 9.2 8.8* 9.2 9.1  MG 1.7  --   --   --   --   PHOS 3.2  --   --   --   --    Liver Function Tests: Recent Labs  Lab 06/08/19 0311 06/09/19 0325 06/10/19 0220 06/11/19 0332 06/12/19 0445  AST 42* 37 37 33 32  ALT 38 33 28 28 25   ALKPHOS 53 51 48 51 57  BILITOT 0.3 0.7 0.4 0.6 0.6  PROT 6.9 6.9 6.7 6.8 6.8  ALBUMIN 3.5 3.5 3.3* 3.4* 3.4*   No results for input(s): LIPASE, AMYLASE in the last 168 hours. No results for input(s): AMMONIA in the last 168 hours. CBC: Recent Labs  Lab 06/07/19 1220 06/08/19 0311 06/09/19 0325 06/10/19 0220 06/11/19 0332 06/12/19 0445  WBC 3.6* 3.6* 6.3 6.1 4.4 9.0  NEUTROABS 2.2 2.4  --   --   --   --   HGB 16.6 16.5 15.9 16.0 17.0 16.3  HCT 48.1 48.7 46.9 47.8 48.8 47.4  MCV 91.8 94.6 92.3 92.6 91.6 91.9  PLT 83* 75* 101* 112* 122* 154   Cardiac Enzymes: Recent Labs  Lab 06/07/19 1220  CKTOTAL 49   BNP: Invalid input(s): POCBNP CBG: Recent Labs  Lab 06/11/19 1051 06/11/19 1627 06/11/19 2008 06/12/19 0753 06/12/19 1109  GLUCAP 461* 341* 370* 279* 357*   D-Dimer Recent Labs    06/11/19 0332 06/12/19 0445  DDIMER 0.56* 0.39   Hgb A1c No results for input(s): HGBA1C in the last 72 hours. Lipid Profile No results for input(s): CHOL, HDL, LDLCALC, TRIG, CHOLHDL, LDLDIRECT in the last 72 hours. Thyroid function studies No results for input(s): TSH, T4TOTAL, T3FREE, THYROIDAB in the last 72 hours.  Invalid input(s): FREET3 Anemia work up Recent Labs    06/11/19 0332 06/12/19 0445  FERRITIN 995* 1,172*   Urinalysis    Component Value Date/Time   COLORURINE  YELLOW 03/23/2010 [place] 03/23/2010 0953   LABSPEC 1.020 03/23/2010 0953   PHURINE 7.0 03/23/2010 [place]  03/23/2010 0953   HGBUR NEGATIVE 03/23/2010 0953   BILIRUBINUR NEGATIVE 03/23/2010 0953   KETONESUR NEGATIVE 03/23/2010 0953   PROTEINUR NEGATIVE 03/23/2010 0953   UROBILINOGEN 1.0 03/23/2010 0953   NITRITE NEGATIVE 03/23/2010 0953   LEUKOCYTESUR  03/23/2010 0953    NEGATIVE MICROSCOPIC NOT DONE ON URINES WITH NEGATIVE PROTEIN, BLOOD, LEUKOCYTES, NITRITE, OR GLUCOSE <1000 mg/dL.   Sepsis Labs Invalid input(s): PROCALCITONIN,  WBC,  LACTICIDVEN  FURTHER DISCHARGE INSTRUCTIONS:   Get Medicines reviewed and adjusted: Please take all your medications with you for your next visit with your Primary MD   Laboratory/radiological data: Please request your Primary MD to go over all hospital tests and procedure/radiological results at the follow up, please ask your Primary MD to get all Hospital records sent to his/her office.   In some cases, they will be blood work, cultures and biopsy results pending at the time of your discharge. Please request that your primary care M.D. goes through all the records of your hospital data and follows up on these results.   Also Note the following: If you experience worsening of your admission symptoms, develop shortness of breath, life threatening emergency, suicidal or homicidal thoughts you must seek medical attention immediately by calling 911 or calling your MD immediately  if symptoms less severe.   You must read complete instructions/literature along with all the possible adverse reactions/side effects for all the Medicines you take and that have been prescribed to you. Take any new Medicines after you have completely understood and accpet all the possible adverse reactions/side effects.    Do not drive when taking Pain medications or sleeping medications (Benzodaizepines)   Do not take more than prescribed Pain, Sleep and Anxiety Medications. It is not advisable to combine anxiety,sleep and pain medications without talking with your primary care  practitioner   Special Instructions: If you have smoked or chewed Tobacco  in the last 2 yrs please stop smoking, stop any regular Alcohol  and or any Recreational drug use.   Wear Seat belts while driving.   Please note: You were cared for by a hospitalist during your hospital stay. Once you are discharged, your primary care physician will handle any further medical issues. Please note that NO REFILLS for any discharge medications will be authorized once you are discharged, as it is imperative that you return to your primary care physician (or establish a relationship with a primary care physician if you do not have one) for your post hospital discharge needs so that they can reassess your need for medications and monitor your lab values.  Time coordinating discharge: 45 minutes  SIGNED:  Marzetta Board, MD, PhD 06/12/2019, 12:51 PM

## 2019-06-12 NOTE — Progress Notes (Signed)
Physical Therapy Treatment Patient Details Name: [name] [name] MRN: [id number] DOB: Mar 03, 1951 Today's Date: 06/12/2019    History of Present Illness SYED ZUKAS is a 68 y.o. male with medical history significant for coronary artery disease, hypertension, obstructive sleep apnea, aortic stenosis, diabetes mellitus, tremor, patient presented to the ED with complaints of cough and wheezing , diarrhea, temp of 103.SARS-CoV-2 test positive.  Portable chest x-ray shows left lower lobe opacity    PT Comments    Patient  Reports up ad lib to Br. Observed, gait guarded, tends to " cruise" on objects. Practiced 1 step with simulated rail and able to perform.  Possible Dc today.  Follow Up Recommendations  No PT follow up     Equipment Recommendations  None recommended by PT    Recommendations for Other Services       Precautions / Restrictions Precautions Precautions: Fall    Mobility  Bed Mobility Overal bed mobility: Independent                Transfers Overall transfer level: Independent                  Ambulation/Gait Ambulation/Gait assistance: Supervision;Modified independent (Device/Increase time) Gait Distance (Feet): 40 Feet   Gait Pattern/deviations: Step-through pattern;Decreased stride length;Shuffle;Drifts right/left     General Gait Details: up to BR ad lib, cruising on objects   Stairs Stairs: Yes Stairs assistance: Min guard Stair Management: One rail Right;Step to pattern Number of Stairs: 1 General stair comments: slighly unsteady, needs to have someone assist if possible.   Wheelchair Mobility    Modified Rankin (Stroke Patients Only)       Balance     Sitting balance-Leahy Scale: Good       Standing balance-Leahy Scale: Fair Standing balance comment: guarded posture walking without RW                            Cognition Arousal/Alertness: Awake/alert                                             Exercises      General Comments        Pertinent Vitals/Pain Pain Assessment: No/denies pain    Home Living                      Prior Function            PT Goals (current goals can now be found in the care plan section) Progress towards PT goals: Progressing toward goals    Frequency    Min 3X/week      PT Plan Current plan remains appropriate    Co-evaluation              AM-PAC PT "6 Clicks" Mobility   Outcome Measure  Help needed turning from your back to your side while in a flat bed without using bedrails?: None Help needed moving from lying on your back to sitting on the side of a flat bed without using bedrails?: None Help needed moving to and from a bed to a chair (including a wheelchair)?: None Help needed standing up from a chair using your arms (e.g., wheelchair or bedside chair)?: None Help needed to walk in hospital room?: None Help needed climbing 3-5 steps with a  railing? : A Little 6 Click Score: 23    End of Session Equipment Utilized During Treatment: Gait belt Activity Tolerance: Patient tolerated treatment well Patient left: in bed Nurse Communication: Mobility status PT Visit Diagnosis: Difficulty in walking, not elsewhere classified (R26.2)     Time: 4103-0131 PT Time Calculation (min) (ACUTE ONLY): 15 min  Charges:  $Gait Training: 8-22 mins                     [place]  Office [phone]    Claretha Cooper 06/12/2019, 2:42 PM

## 2019-06-20 DIAGNOSIS — Z09 Encounter for follow-up examination after completed treatment for conditions other than malignant neoplasm: Secondary | ICD-10-CM | POA: Diagnosis not present

## 2019-06-20 DIAGNOSIS — U071 COVID-19: Secondary | ICD-10-CM | POA: Diagnosis not present

## 2019-06-23 DIAGNOSIS — U071 COVID-19: Secondary | ICD-10-CM | POA: Diagnosis not present

## 2019-07-15 ENCOUNTER — Other Ambulatory Visit: Payer: Self-pay | Admitting: Neurology

## 2019-08-01 ENCOUNTER — Other Ambulatory Visit: Payer: Self-pay

## 2019-08-01 ENCOUNTER — Emergency Department (HOSPITAL_BASED_OUTPATIENT_CLINIC_OR_DEPARTMENT_OTHER)
Admission: EM | Admit: 2019-08-01 | Discharge: 2019-08-01 | Disposition: A | Discharge status: Home / Self Care | Payer: Medicare HMO | Attending: Emergency Medicine | Admitting: Emergency Medicine

## 2019-08-01 ENCOUNTER — Emergency Department (HOSPITAL_BASED_OUTPATIENT_CLINIC_OR_DEPARTMENT_OTHER): Payer: Medicare HMO

## 2019-08-01 ENCOUNTER — Encounter (HOSPITAL_BASED_OUTPATIENT_CLINIC_OR_DEPARTMENT_OTHER): Payer: Self-pay | Admitting: *Deleted

## 2019-08-01 DIAGNOSIS — Z7982 Long term (current) use of aspirin: Secondary | ICD-10-CM | POA: Insufficient documentation

## 2019-08-01 DIAGNOSIS — S0990XA Unspecified injury of head, initial encounter: Secondary | ICD-10-CM | POA: Diagnosis not present

## 2019-08-01 DIAGNOSIS — Y92014 Private driveway to single-family (private) house as the place of occurrence of the external cause: Secondary | ICD-10-CM | POA: Diagnosis not present

## 2019-08-01 DIAGNOSIS — Z79899 Other long term (current) drug therapy: Secondary | ICD-10-CM | POA: Diagnosis not present

## 2019-08-01 DIAGNOSIS — I10 Essential (primary) hypertension: Secondary | ICD-10-CM | POA: Diagnosis not present

## 2019-08-01 DIAGNOSIS — Y999 Unspecified external cause status: Secondary | ICD-10-CM | POA: Insufficient documentation

## 2019-08-01 DIAGNOSIS — E119 Type 2 diabetes mellitus without complications: Secondary | ICD-10-CM | POA: Diagnosis not present

## 2019-08-01 DIAGNOSIS — W19XXXA Unspecified fall, initial encounter: Secondary | ICD-10-CM

## 2019-08-01 DIAGNOSIS — Y939 Activity, unspecified: Secondary | ICD-10-CM | POA: Insufficient documentation

## 2019-08-01 DIAGNOSIS — Z7984 Long term (current) use of oral hypoglycemic drugs: Secondary | ICD-10-CM | POA: Insufficient documentation

## 2019-08-01 DIAGNOSIS — S299XXA Unspecified injury of thorax, initial encounter: Secondary | ICD-10-CM | POA: Diagnosis not present

## 2019-08-01 DIAGNOSIS — I2510 Atherosclerotic heart disease of native coronary artery without angina pectoris: Secondary | ICD-10-CM | POA: Diagnosis not present

## 2019-08-01 DIAGNOSIS — R42 Dizziness and giddiness: Secondary | ICD-10-CM | POA: Diagnosis not present

## 2019-08-01 DIAGNOSIS — W01198A Fall on same level from slipping, tripping and stumbling with subsequent striking against other object, initial encounter: Secondary | ICD-10-CM | POA: Diagnosis not present

## 2019-08-01 LAB — BASIC METABOLIC PANEL
Anion gap: 10 (ref 5–15)
BUN: 18 mg/dL (ref 8–23)
CO2: 25 mmol/L (ref 22–32)
Calcium: 9.1 mg/dL (ref 8.9–10.3)
Chloride: 101 mmol/L (ref 98–111)
Creatinine, Ser: 1.03 mg/dL (ref 0.61–1.24)
GFR calc Af Amer: >60 mL/min (ref >60)
GFR calc non Af Amer: >60 mL/min (ref >60)
Glucose, Bld: 374 mg/dL — ABNORMAL HIGH (ref 70–99)
Potassium: 4.1 mmol/L (ref 3.5–5.1)
Sodium: 136 mmol/L (ref 135–145)

## 2019-08-01 LAB — CBC
HCT: 43.1 % (ref 39.0–52.0)
Hemoglobin: 14.5 g/dL (ref 13.0–17.0)
MCH: 31.7 pg (ref 26.0–34.0)
MCHC: 33.6 g/dL (ref 30.0–36.0)
MCV: 94.1 fL (ref 80.0–100.0)
Platelets: 142 10*3/uL — ABNORMAL LOW (ref 150–400)
RBC: 4.58 MIL/uL (ref 4.22–5.81)
RDW: 14.3 % (ref 11.5–15.5)
WBC: 5.7 10*3/uL (ref 4.0–10.5)
nRBC: 0 % (ref 0.0–0.2)

## 2019-08-01 MED ORDER — SODIUM CHLORIDE 0.9 % IV BOLUS
1000.0000 mL | Freq: Once | INTRAVENOUS | Status: AC
  Administered 2019-08-01: 1000 mL via INTRAVENOUS

## 2019-08-01 MED ORDER — MECLIZINE HCL 25 MG PO TABS: 25.0000 mg | ORAL_TABLET | Freq: Three times a day (TID) | ORAL | 0 refills | Status: DC | PRN

## 2019-08-01 MED ORDER — MECLIZINE HCL 25 MG PO TABS
25.0000 mg | ORAL_TABLET | Freq: Once | ORAL | Status: AC
  Administered 2019-08-01: 25 mg via ORAL
  Filled 2019-08-01: qty 1

## 2019-08-01 NOTE — ED Notes (Signed)
Patient transported to X-ray 

## 2019-08-01 NOTE — Discharge Instructions (Addendum)
[name] [name],  [name] [name] were seen in the ED today for follow up of your head injury. Your CT is negative for any acute bleed. I have prescribed meclizine for your intermittent dizziness. Take this as needed. Take your BP medications. Continue to hydrate well and if your symptoms persist, follow up with your PCP.   If you develop severe headache, visual changes, nausea/vomiting or persistent dizziness, please seek emergent medical care.

## 2019-08-01 NOTE — ED Notes (Signed)
Patient transported to CT 
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
## 2019-08-01 NOTE — ED Provider Notes (Signed)
[facility] EMERGENCY DEPARTMENT Provider Note   CSN: [id number] Arrival date & time: 08/01/19  0825  History   Chief Complaint Chief Complaint  Patient presents with  . Head Injury    HPI [name] [name] is a 68 y.o. male with Hx of HTN, CAD s/p PCI, HLD and mild aortic stenosis presenting after a fall last Thursday. History obtained by patient. He reports that he fell his driveway on Thursday and rolled into the woods, hitting his head on a tree. He lost consciousness however, he is unaware if this was prior to the fall or after hitting his head. When he woke up, it was dark outside. He denies any headaches or visual disturbances since the event but does report intermittent dizziness and nausea. He has dizziness when bending down and also upon standing from a seated position. He spoke with his PCP regarding his symptoms and was instructed to present to ED to rule out any life threatening causes of symptoms.  Of note, patient was admitted in July for pneumonia due to [place] for 5 days. He denies any respiratory symptoms at this time.   Past Medical History:  Diagnosis Date  . Coronary artery disease    s/p PCI of LAD w Residual 50-60% RCA  . Diabetes ([place])   . Dilated aortic root (HCC)    62mm by echo 01/2018  . High cholesterol   . History of echocardiogram 06/2011   Normal LVF,mild LVH,trivial TR/MR, mild AS/AI  . History of kidney stones   . Hypertension   . Mild aortic stenosis    mean AV gradient 40mmHg by echo 02/2016  . Tremor     Patient Active Problem List   Diagnosis Date Noted  . COVID-19 virus infection 06/10/2019  . Chronic diarrhea 06/09/2019  . Viral pneumonia 06/07/2019  . Essential tremor 07/04/2018  . Obstructive sleep apnea 07/04/2018  . Dilated aortic root ([place])   . Tremor 09/27/2017  . Ascending aorta dilatation (HCC) 02/16/2017  . Mild aortic stenosis   . Coronary atherosclerosis of native coronary artery 12/30/2013  . Essential hypertension,  benign 12/30/2013  . Pure hypercholesterolemia 12/30/2013  . Encounter for long-term (current) use of other medications 12/30/2013    Past Surgical History:  Procedure Laterality Date  . arm surgery Right   . stents - placement     x 2       Home Medications    Prior to Admission medications   Medication Sig Start Date End Date Taking? Authorizing Provider  allopurinol (ZYLOPRIM) 300 MG tablet Take 1 tablet (300 mg total) by mouth daily. 03/10/15   Sueanne Margarita, MD  amLODipine (NORVASC) 10 MG tablet Take 1 tablet (10 mg total) by mouth daily. 02/28/17   Sueanne Margarita, MD  aspirin 81 MG tablet Take 81 mg by mouth daily.    [provider]  Blood Glucose Monitoring Suppl (FREESTYLE LITE) DEVI Please dispense continuous glucose monitoring system thank you 06/12/19   Caren Griffins, MD  buPROPion (WELLBUTRIN XL) 300 MG 24 hr tablet Take 300 mg by mouth daily.    [provider]  Cholecalciferol (VITAMIN D) 125 MCG (5000 UT) CAPS Take 1 tablet by mouth daily.    [provider]  dexamethasone (DECADRON) 6 MG tablet Take 1 tablet (6 mg total) by mouth daily. 06/13/19   Caren Griffins, MD  ezetimibe (ZETIA) 10 MG tablet TAKE 1 TABLET BY MOUTH DAILY. PLEASE MAKE OVERDUE APPT WITH DR. Radford Pax  BEFORE ANYMORE REFILLS 02/10/19   Turner, Eber Hong, MD  guaiFENesin-dextromethorphan (ROBITUSSIN DM) 100-10 MG/5ML syrup Take 10 mLs by mouth every 4 (four) hours as needed for cough. 06/12/19   Caren Griffins, MD  Insulin Glargine (LANTUS) 100 UNIT/ML Solostar Pen Inject 10 Units into the skin daily. 06/12/19   Caren Griffins, MD  losartan-hydrochlorothiazide (HYZAAR) 100-25 MG tablet Take 1 tablet daily by mouth. 09/26/17   Daune Perch, NP  metFORMIN (GLUCOPHAGE) 500 MG tablet Take 500 mg daily by mouth. 09/14/17   [provider]  nitroGLYCERIN (NITROSTAT) 0.4 MG SL tablet Place 1 tablet (0.4 mg total) under the tongue every 5 (five) minutes as needed for  chest pain. 05/09/17   Sueanne Margarita, MD  pantoprazole (PROTONIX) 40 MG tablet Take 1 tablet by mouth daily. 03/04/19   [provider]  primidone (MYSOLINE) 50 MG tablet Take 1 tablet (50 mg total) by mouth 3 (three) times daily. Please call [phone] to schedule appt. 07/15/19   Marcial Pacas, MD  propranolol ER (INDERAL LA) 120 MG 24 hr capsule Take 2 capsules (240 mg total) by mouth daily. 07/04/18   Marcial Pacas, MD  rosuvastatin (CRESTOR) 20 MG tablet Take 1 tablet (20 mg total) by mouth daily. 04/17/18   Consuelo Pandy, PA-C    Family History Family History  Problem Relation Age of Onset  . Other Father        unsure of history  . CAD Brother   . Heart failure Mother   . Emphysema Mother   . CAD Mother     Social History Social History   Tobacco Use  . Smoking status: Never Smoker  . Smokeless tobacco: Never Used  Substance Use Topics  . Alcohol use: Yes    Comment: socially  . Drug use: No     Allergies   Codeine   Review of Systems Review of Systems  Constitutional: Negative for activity change, appetite change, chills and fever.  HENT: Negative for ear discharge, ear pain, facial swelling, rhinorrhea and sinus pressure.   Eyes: Negative for photophobia, pain, discharge and visual disturbance.  Respiratory: Negative for cough, chest tightness, shortness of breath and wheezing.   Cardiovascular: Negative for chest pain and palpitations.  Gastrointestinal: Positive for nausea. Negative for abdominal distention, abdominal pain, constipation, diarrhea and vomiting.  Endocrine: Negative.   Genitourinary: Negative.   Musculoskeletal: Negative.   Allergic/Immunologic: Negative.   Neurological: Positive for dizziness. Negative for facial asymmetry, speech difficulty, weakness, light-headedness, numbness and headaches.  Hematological: Negative.   Psychiatric/Behavioral: Negative.     Physical Exam Updated Vital Signs BP (!) 162/7 (BP Location: Right Arm)    Pulse 73   Temp 98.6 F (37 C) (Oral)   Resp 14   Ht 5\' 10"  (1.778 m)   Wt 106.6 kg   SpO2 96%   BMI 33.72 kg/m   Physical Exam Constitutional:      General: He is not in acute distress.    Appearance: Normal appearance.  HENT:     Head: Normocephalic.     Comments: 3-4cm scabbed over laceration on right temporal region     Right Ear: Tympanic membrane, ear canal and external ear normal.     Left Ear: Tympanic membrane, ear canal and external ear normal.     Nose: Nose normal.     Mouth/Throat:     Mouth: Mucous membranes are moist.     Pharynx: Oropharynx is clear. No oropharyngeal exudate or posterior  oropharyngeal erythema.  Eyes:     General:        Right eye: No discharge.        Left eye: No discharge.     Extraocular Movements: Extraocular movements intact.     Conjunctiva/sclera: Conjunctivae normal.     Pupils: Pupils are equal, round, and reactive to light.     Comments: Slight nystagmus to the right  Bruising noted around the left eye - yellow in color   Neck:     Musculoskeletal: Normal range of motion and neck supple. No neck rigidity or muscular tenderness.  Cardiovascular:     Rate and Rhythm: Normal rate and regular rhythm.     Pulses: Normal pulses.     Heart sounds: Normal heart sounds.  Pulmonary:     Effort: Pulmonary effort is normal. No respiratory distress.     Breath sounds: Normal breath sounds. No wheezing, rhonchi or rales.  Abdominal:     General: Bowel sounds are normal. There is no distension.     Palpations: Abdomen is soft.     Tenderness: There is no abdominal tenderness.  Musculoskeletal: Normal range of motion.        General: No swelling, tenderness or signs of injury.  Lymphadenopathy:     Cervical: No cervical adenopathy.  Skin:    General: Skin is warm and dry.     Capillary Refill: Capillary refill takes less than 2 seconds.  Neurological:     General: No focal deficit present.     Mental Status: He is alert and oriented to  person, place, and time. Mental status is at baseline.     Cranial Nerves: No cranial nerve deficit.     Sensory: No sensory deficit.     Motor: No weakness.     Coordination: Coordination normal.  Psychiatric:        Mood and Affect: Mood normal.        Behavior: Behavior normal.        Thought Content: Thought content normal.        Judgment: Judgment normal.    ED Treatments / Results  Labs (all labs ordered are listed, but only abnormal results are displayed) Labs Reviewed  CBC - Abnormal; Notable for the following components:      Result Value   Platelets 142 (*)    All other components within normal limits  BASIC METABOLIC PANEL - Abnormal; Notable for the following components:   Glucose, Bld 374 (*)    All other components within normal limits    EKG EKG Interpretation  Date/Time:  Friday August 01 2019 09:19:45 EDT Ventricular Rate:  69 PR Interval:    QRS Duration: 101 QT Interval:  401 QTC Calculation: 430 R Axis:   -17 Text Interpretation:  Sinus rhythm Borderline left axis deviation Borderline T abnormalities, inferior leads No significant change since last tracing Confirmed by Isla Pence [phone]) on 08/01/2019 9:25:09 AM   Radiology Dg Chest 2 View  Result Date: 08/01/2019 CLINICAL DATA:  Fall 1 week ago. Weakness. Coronary artery disease and diabetes. EXAM: CHEST - 2 VIEW COMPARISON:  06/07/2019 FINDINGS: The heart size and mediastinal contours are within normal limits. Both lungs are clear. The visualized skeletal structures are unremarkable. IMPRESSION: No active cardiopulmonary disease. Electronically Signed   By: Marlaine Hind M.D.   On: 08/01/2019 09:39   Ct Head Wo Contrast  Result Date: 08/01/2019 CLINICAL DATA:  Dizziness and ataxia.  Fall 1 week prior EXAM: CT  HEAD WITHOUT CONTRAST TECHNIQUE: Contiguous axial images were obtained from the base of the skull through the vertex without intravenous contrast. COMPARISON:  November 24, 2017 FINDINGS:  Brain: There is stable age related volume loss. There is no intracranial mass, hemorrhage, extra-axial fluid collection, or midline shift. There is mild patchy small vessel disease in the centra semiovale bilaterally. No acute infarct evident. Vascular: No hyperdense vessel. There is calcification in the distal left vertebral artery and carotid siphon regions bilaterally. Skull: The bony calvarium appears intact. Sinuses/Orbits: There is mucosal thickening in several ethmoid air cells. Other paranasal sinuses are clear. There is leftward deviation of the nasal septum. Evidence of cataract removal on the right. Orbits otherwise appear symmetric bilaterally. Other: Mastoid air cells are clear on the right. There is opacification in several mastoid air cells on the left inferiorly, stable. IMPRESSION: Age related volume loss with periventricular small vessel disease, stable. No acute infarct. No mass or hemorrhage. There are foci of arterial vascular calcification. There is mucosal thickening in several ethmoid air cells. There is chronic mucosal thickening in several inferior mastoid air cells on the left. Deviated nasal septum noted. Electronically Signed   By: Lowella Grip III M.D.   On: 08/01/2019 10:04    Procedures Procedures (including critical care time)  Medications Ordered in ED Medications  meclizine (ANTIVERT) tablet 25 mg (25 mg Oral Given 08/01/19 0926)  sodium chloride 0.9 % bolus 1,000 mL (1,000 mLs Intravenous New Bag/Given 08/01/19 0926)     Initial Impression / Assessment and Plan / ED Course  I have reviewed the triage vital signs and the nursing notes.  Pertinent labs & imaging results that were available during my care of the patient were reviewed by me and considered in my medical decision making (see chart for details).  Patient is a 68yr old male presenting to the ED today after fall with head trauma 8 days ago. He is unsure if he lost consciousness prior to or after  hitting his head but reports that when he fell it was day light and when he woke up, it was night time. Patient has had intermittent dizziness with changing positions and nausea but denies any headache, vomiting, fevers, chills, visual disturbances, chest pain, palpitations, shortness of breath or focal weakness.  On exam, there is a 3-4cm healed laceration to the left parietal area of head and yellow discoloration around left eye consistent with timing of injury. No other lesions noted. Neurological examination only significant for slight nystagmus to the right. EKG, CXR and CT Head w/o contrast wnl. Patient given fluids, meclizine with improvement in symptoms.  Patient is safe for discharge to home with PCP follow up. Meclizine given on discharge with instructions to increase hydration and take BP medications regularly as prescribed. Return precautions given.   Final Clinical Impressions(s) / ED Diagnoses   Final diagnoses:  Fall    ED Discharge Orders    None       Harvie Heck, MD 08/01/19 1026    Isla Pence, MD 08/01/19 1259    Isla Pence, MD 08/01/19 1300

## 2019-08-01 NOTE — ED Notes (Signed)
ED Provider at bedside. 

## 2019-08-05 DIAGNOSIS — H524 Presbyopia: Secondary | ICD-10-CM | POA: Diagnosis not present

## 2019-08-05 DIAGNOSIS — Z961 Presence of intraocular lens: Secondary | ICD-10-CM | POA: Diagnosis not present

## 2019-08-05 DIAGNOSIS — H2512 Age-related nuclear cataract, left eye: Secondary | ICD-10-CM | POA: Diagnosis not present

## 2019-08-27 DIAGNOSIS — E7800 Pure hypercholesterolemia, unspecified: Secondary | ICD-10-CM | POA: Diagnosis not present

## 2019-08-27 DIAGNOSIS — I10 Essential (primary) hypertension: Secondary | ICD-10-CM | POA: Diagnosis not present

## 2019-08-27 DIAGNOSIS — E1169 Type 2 diabetes mellitus with other specified complication: Secondary | ICD-10-CM | POA: Diagnosis not present

## 2019-08-27 DIAGNOSIS — F324 Major depressive disorder, single episode, in partial remission: Secondary | ICD-10-CM | POA: Diagnosis not present

## 2019-09-28 DIAGNOSIS — I10 Essential (primary) hypertension: Secondary | ICD-10-CM | POA: Diagnosis not present

## 2019-09-28 DIAGNOSIS — E119 Type 2 diabetes mellitus without complications: Secondary | ICD-10-CM | POA: Diagnosis not present

## 2019-09-28 DIAGNOSIS — E7800 Pure hypercholesterolemia, unspecified: Secondary | ICD-10-CM | POA: Diagnosis not present

## 2019-09-28 DIAGNOSIS — F324 Major depressive disorder, single episode, in partial remission: Secondary | ICD-10-CM | POA: Diagnosis not present

## 2019-09-28 DIAGNOSIS — E1169 Type 2 diabetes mellitus with other specified complication: Secondary | ICD-10-CM | POA: Diagnosis not present

## 2019-11-28 ENCOUNTER — Encounter: Payer: Self-pay | Admitting: Cardiology

## 2019-11-28 ENCOUNTER — Telehealth (INDEPENDENT_AMBULATORY_CARE_PROVIDER_SITE_OTHER): Payer: Medicare HMO | Admitting: Cardiology

## 2019-11-28 ENCOUNTER — Other Ambulatory Visit: Payer: Self-pay

## 2019-11-28 VITALS — Ht 70.0 in | Wt 235.0 lb

## 2019-11-28 DIAGNOSIS — I10 Essential (primary) hypertension: Secondary | ICD-10-CM | POA: Diagnosis not present

## 2019-11-28 DIAGNOSIS — I350 Nonrheumatic aortic (valve) stenosis: Secondary | ICD-10-CM | POA: Diagnosis not present

## 2019-11-28 DIAGNOSIS — I77810 Thoracic aortic ectasia: Secondary | ICD-10-CM | POA: Diagnosis not present

## 2019-11-28 DIAGNOSIS — E7800 Pure hypercholesterolemia, unspecified: Secondary | ICD-10-CM | POA: Diagnosis not present

## 2019-11-28 DIAGNOSIS — I2510 Atherosclerotic heart disease of native coronary artery without angina pectoris: Secondary | ICD-10-CM | POA: Diagnosis not present

## 2019-11-28 MED ORDER — ROSUVASTATIN CALCIUM 20 MG PO TABS: 20.0000 mg | ORAL_TABLET | Freq: Every day | ORAL | 11 refills | Status: DC

## 2019-11-28 MED ORDER — EZETIMIBE 10 MG PO TABS: 10.0000 mg | ORAL_TABLET | Freq: Every day | ORAL | 0 refills | Status: DC

## 2019-11-28 NOTE — Patient Instructions (Signed)
Medication Instructions:  Your physician recommends that you continue on your current medications as directed. Please refer to the Current Medication list given to you today.  *If you need a refill on your cardiac medications before your next appointment, please call your pharmacy*  Lab Work: CMET and fasting lipid panel  If you have labs (blood work) drawn today and your tests are completely normal, you will receive your results only by: Marland Kitchen MyChart Message (if you have MyChart) OR . A paper copy in the mail If you have any lab test that is abnormal or we need to change your treatment, we will call you to review the results.  Testing/Procedures: Your physician has requested that you have an echocardiogram. Echocardiography is a painless test that uses sound waves to create images of your heart. It provides your doctor with information about the size and shape of your heart and how well your heart's chambers and valves are working. This procedure takes approximately one hour. There are no restrictions for this procedure.  Follow-Up: At [facility], you and your health needs are our priority.  As part of our continuing mission to provide you with exceptional heart care, we have created designated Provider Care Teams.  These Care Teams include your primary Cardiologist (physician) and Advanced Practice Providers (APPs -  Physician Assistants and Nurse Practitioners) who all work together to provide you with the care you need, when you need it.  Your next appointment:   1 year(s)  The format for your next appointment:   Either In Person or Virtual  Provider:   Fransico Him, MD

## 2019-11-28 NOTE — Progress Notes (Signed)
Virtual Visit via Telephone Note   This visit type was conducted due to national recommendations for restrictions regarding the COVID-19 Pandemic (e.g. social distancing) in an effort to limit this patient's exposure and mitigate transmission in our community.  Due to his co-morbid illnesses, this patient is at least at moderate risk for complications without adequate follow up.  This format is felt to be most appropriate for this patient at this time.  The patient did not have access to video technology/had technical difficulties with video requiring transitioning to audio format only (telephone).  All issues noted in this document were discussed and addressed.  No physical exam could be performed with this format.  Please refer to the patient's chart for his  consent to telehealth for [facility].   Evaluation Performed:  Follow-up visit  This visit type was conducted due to national recommendations for restrictions regarding the COVID-19 Pandemic (e.g. social distancing).  This format is felt to be most appropriate for this patient at this time.  All issues noted in this document were discussed and addressed.  No physical exam was performed (except for noted visual exam findings with Video Visits).  Please refer to the patient's chart (MyChart message for video visits and phone note for telephone visits) for the patient's consent to telehealth for [facility].  Date:  11/28/2019   ID:  [name] [name], DOB March 04, 1951, MRN [id number]  Patient Location:  Home  Provider location:   [place]  PCP:  [name], MD  Cardiologist:  Fransico Him, MD  Electrophysiologist:  None   Chief Complaint:  CAD, AS, HTN  History of Present Illness:    [name] [name] is a 69 y.o. male who presents via audio/video conferencing for a telehealth visit today.    [name] [name] is a 69 y.o. male with a history of ASCAD s/-p PCI LAD and residual RCA of 50-60%, mild AS by echo 2019, HTN and  dyslipidemia.  He is here today for followup and is doing well.  He denies any anginal chest pain or pressure,  PND, orthopnea, LE edema, dizziness, palpitations or syncope.   He has some mild DOE at times but does not limit him.  He is compliant with his meds and is tolerating meds with no SE.      The patient does not have symptoms concerning for COVID-19 infection (fever, chills, cough, or new shortness of breath).   Prior CV studies:   The following studies were reviewed today:  none  Past Medical History:  Diagnosis Date  . Coronary artery disease    s/p PCI of LAD w Residual 50-60% RCA  . Diabetes ([place])   . Dilated aortic root (HCC)    85mm by echo 01/2018  . High cholesterol   . History of echocardiogram 06/2011   Normal LVF,mild LVH,trivial TR/MR, mild AS/AI  . History of kidney stones   . Hypertension   . Mild aortic stenosis    mean AV gradient 7mmHg by echo 02/2016  . Tremor    Past Surgical History:  Procedure Laterality Date  . arm surgery Right   . stents - placement     x 2     Current Meds  Medication Sig  . allopurinol (ZYLOPRIM) 300 MG tablet Take 1 tablet (300 mg total) by mouth daily.  Marland Kitchen amLODipine (NORVASC) 10 MG tablet Take 1 tablet (10 mg total) by mouth daily.  Marland Kitchen aspirin 81 MG tablet Take 81 mg by mouth  daily.  . Blood Glucose Monitoring Suppl (FREESTYLE LITE) DEVI Please dispense continuous glucose monitoring system thank you  . buPROPion (WELLBUTRIN XL) 300 MG 24 hr tablet Take 300 mg by mouth daily.  . Cholecalciferol (VITAMIN D) 125 MCG (5000 UT) CAPS Take 1 tablet by mouth daily.  Marland Kitchen ezetimibe (ZETIA) 10 MG tablet TAKE 1 TABLET BY MOUTH DAILY. PLEASE MAKE OVERDUE APPT WITH DR. Radford Pax BEFORE ANYMORE REFILLS  . Insulin Glargine (LANTUS) 100 UNIT/ML Solostar Pen Inject 10 Units into the skin daily.  Marland Kitchen losartan-hydrochlorothiazide (HYZAAR) 100-25 MG tablet Take 1 tablet daily by mouth.  . metFORMIN (GLUCOPHAGE) 500 MG tablet Take 500 mg daily by  mouth.  . nitroGLYCERIN (NITROSTAT) 0.4 MG SL tablet Place 1 tablet (0.4 mg total) under the tongue every 5 (five) minutes as needed for chest pain.  . pantoprazole (PROTONIX) 40 MG tablet Take 1 tablet by mouth daily.  . primidone (MYSOLINE) 50 MG tablet Take 1 tablet (50 mg total) by mouth 3 (three) times daily. Please call [phone] to schedule appt.  . propranolol ER (INDERAL LA) 120 MG 24 hr capsule Take 2 capsules (240 mg total) by mouth daily.  . rosuvastatin (CRESTOR) 20 MG tablet Take 1 tablet (20 mg total) by mouth daily.     Allergies:   Codeine   Social History   Tobacco Use  . Smoking status: Never Smoker  . Smokeless tobacco: Never Used  Substance Use Topics  . Alcohol use: Yes    Comment: socially  . Drug use: No     Family Hx: The patient's family history includes CAD in his brother and mother; Emphysema in his mother; Heart failure in his mother; Other in his father.  ROS:   Please see the history of present illness.     All other systems reviewed and are negative.   Labs/Other Tests and Data Reviewed:    Recent Labs: 06/08/2019: Magnesium 1.7 06/12/2019: ALT 25 08/01/2019: BUN 18; Creatinine, Ser 1.03; Hemoglobin 14.5; Platelets 142; Potassium 4.1; Sodium 136   Recent Lipid Panel Lab Results  Component Value Date/Time   CHOL 133 12/20/2017 07:32 AM   TRIG 226 (H) 12/20/2017 07:32 AM   HDL 33 (L) 12/20/2017 07:32 AM   CHOLHDL 4.0 12/20/2017 07:32 AM   CHOLHDL 2.4 03/10/2016 07:47 AM   LDLCALC 55 12/20/2017 07:32 AM   LDLDIRECT 54.0 08/18/2015 07:33 AM    Wt Readings from Last 3 Encounters:  11/28/19 235 lb (106.6 kg)  08/01/19 235 lb (106.6 kg)  06/07/19 213 lb (96.6 kg)     Objective:    Vital Signs:  Ht 5\' 10"  (1.778 m)   Wt 235 lb (106.6 kg)   BMI 33.72 kg/m    ASSESSMENT & PLAN:   1. ASCAD -s/-p PCI LAD and residual RCA of 50-60% -denies any angina sx -continue ASA 81mg  daily, BB and statin  2.  HTN -continue amlodipine 10mg   daily, Hyzaar 100-25mg  daily and Propranolol ER 240mg  daily -creatinine was 1.03 in Sept 2020  3.  HLD -LDL goal < 70 -continue Crestor 20mg  daily and Zetia 10mg  daily -check FLP and ALT  4.  Mild AS -mean AVG was 30mmHg by echo 2019  5.  Dilated ascending aorta -2D echo showed mild dilated ascending aorta at 37mm -repeat echo to assess for progression  COVID-19 Education: The signs and symptoms of COVID-19 were discussed with the patient and how to seek care for testing (follow up with PCP or arrange E-visit).  The importance  of social distancing was discussed today.  Patient Risk:   After full review of this patient's clinical status, I feel that they are at least moderate risk at this time.  Time:   Today, I have spent 20 minutes directly with the patient on telemedicine discussing medical problems including CAD< HTN, lipids.  We also reviewed the symptoms of COVID 19 and the ways to protect against contracting the virus with telehealth technology.  I spent an additional 5 minutes reviewing patient's chart including labs and 2D echo.  Medication Adjustments/Labs and Tests Ordered: Current medicines are reviewed at length with the patient today.  Concerns regarding medicines are outlined above.  Tests Ordered: No orders of the defined types were placed in this encounter.  Medication Changes: No orders of the defined types were placed in this encounter.   Disposition:  Follow up in 1 year(s)  Signed, Fransico Him, MD  11/28/2019 8:11 AM    [place]

## 2019-12-10 ENCOUNTER — Other Ambulatory Visit (HOSPITAL_COMMUNITY): Payer: Medicare HMO

## 2019-12-10 ENCOUNTER — Other Ambulatory Visit: Payer: Medicare HMO

## 2019-12-16 ENCOUNTER — Other Ambulatory Visit: Payer: Self-pay

## 2019-12-16 ENCOUNTER — Other Ambulatory Visit: Payer: Self-pay | Admitting: Cardiology

## 2019-12-16 ENCOUNTER — Ambulatory Visit (HOSPITAL_COMMUNITY): Payer: Medicare HMO | Attending: Cardiovascular Disease

## 2019-12-16 ENCOUNTER — Other Ambulatory Visit: Payer: Medicare HMO

## 2019-12-16 ENCOUNTER — Encounter: Payer: Self-pay | Admitting: Cardiology

## 2019-12-16 DIAGNOSIS — I10 Essential (primary) hypertension: Secondary | ICD-10-CM | POA: Diagnosis not present

## 2019-12-16 DIAGNOSIS — I350 Nonrheumatic aortic (valve) stenosis: Secondary | ICD-10-CM

## 2019-12-16 DIAGNOSIS — I2510 Atherosclerotic heart disease of native coronary artery without angina pectoris: Secondary | ICD-10-CM | POA: Diagnosis not present

## 2019-12-16 DIAGNOSIS — I77810 Thoracic aortic ectasia: Secondary | ICD-10-CM

## 2019-12-16 DIAGNOSIS — E7800 Pure hypercholesterolemia, unspecified: Secondary | ICD-10-CM | POA: Insufficient documentation

## 2019-12-16 LAB — LIPID PANEL
Chol/HDL Ratio: 3.9 ratio (ref 0.0–5.0)
Cholesterol, Total: 171 mg/dL (ref 100–199)
HDL: 44 mg/dL (ref >39)
LDL Chol Calc (NIH): 98 mg/dL (ref 0–99)
Triglycerides: 169 mg/dL — ABNORMAL HIGH (ref 0–149)
VLDL Cholesterol Cal: 29 mg/dL (ref 5–40)

## 2019-12-16 LAB — COMPREHENSIVE METABOLIC PANEL
ALT: 53 IU/L — ABNORMAL HIGH (ref 0–44)
AST: 47 IU/L — ABNORMAL HIGH (ref 0–40)
Albumin/Globulin Ratio: 2.4 — ABNORMAL HIGH (ref 1.2–2.2)
Albumin: 4.7 g/dL (ref 3.8–4.8)
Alkaline Phosphatase: 66 IU/L (ref 39–117)
BUN/Creatinine Ratio: 10 (ref 10–24)
BUN: 10 mg/dL (ref 8–27)
Bilirubin Total: 0.4 mg/dL (ref 0.0–1.2)
CO2: 21 mmol/L (ref 20–29)
Calcium: 9.8 mg/dL (ref 8.6–10.2)
Chloride: 102 mmol/L (ref 96–106)
Creatinine, Ser: 1.01 mg/dL (ref 0.76–1.27)
GFR calc Af Amer: 88 mL/min/{1.73_m2} (ref >59)
GFR calc non Af Amer: 76 mL/min/{1.73_m2} (ref >59)
Globulin, Total: 2 g/dL (ref 1.5–4.5)
Glucose: 173 mg/dL — ABNORMAL HIGH (ref 65–99)
Potassium: 4.5 mmol/L (ref 3.5–5.2)
Sodium: 139 mmol/L (ref 134–144)
Total Protein: 6.7 g/dL (ref 6.0–8.5)

## 2019-12-17 ENCOUNTER — Telehealth: Payer: Self-pay | Admitting: Pharmacist

## 2019-12-17 ENCOUNTER — Telehealth: Payer: Self-pay

## 2019-12-17 DIAGNOSIS — I2510 Atherosclerotic heart disease of native coronary artery without angina pectoris: Secondary | ICD-10-CM

## 2019-12-17 DIAGNOSIS — I350 Nonrheumatic aortic (valve) stenosis: Secondary | ICD-10-CM

## 2019-12-17 DIAGNOSIS — I77810 Thoracic aortic ectasia: Secondary | ICD-10-CM

## 2019-12-17 MED ORDER — REPATHA SURECLICK 140 MG/ML ~~LOC~~ SOAJ: 1.0000 "pen " | SUBCUTANEOUS | 11 refills | Status: DC

## 2019-12-17 NOTE — Telephone Encounter (Addendum)
Referral from Dr Radford Pax for lipid management - pt currently on rosuvastatin 20mg  daily (intolerant to 40mg  daily - myalgias) and Zetia 10mg  daily. LDL is 98 above goal < 70 due to history of ASCVD. Previously took [place] in 2017 but had to stop secondary to cost. His insurance now covers [place] on his formulary for $45 month which is more affordable for pt. He is willing to start therapy. Prior authorization submitted and approved. Pt is aware. Will recheck lipids in 2-3 months.

## 2019-12-17 NOTE — Telephone Encounter (Signed)
-----   Message from Nuala Alpha, LPN sent at [id number]  1:36 PM EST -----  ----- Message ----- From: Sueanne Margarita, MD Sent: 12/16/2019  10:52 AM EST To: [facility] Triage  Echo showed normal heart function with mildly leaky MV, TV and AV, mild AS and mildly dilated aortic root at 1mm.  Repeat 2D echo in 1 year for dilated aorta and AS

## 2020-01-22 ENCOUNTER — Ambulatory Visit: Payer: Medicare HMO | Attending: Internal Medicine

## 2020-01-22 DIAGNOSIS — Z23 Encounter for immunization: Secondary | ICD-10-CM | POA: Insufficient documentation

## 2020-01-22 NOTE — Progress Notes (Signed)
   Covid-19 Vaccination Clinic  Name:  [name] [name]    MRN: [id number] DOB: March 20, 1951  01/22/2020  [name] [name] was observed post Covid-19 immunization for 15 minutes without incident. He was provided with Vaccine Information Sheet and instruction to access the V-Safe system.   [name] [name] was instructed to call [phone] with any severe reactions post vaccine: Marland Kitchen Difficulty breathing  . Swelling of face and throat  . A fast heartbeat  . A bad rash all over body  . Dizziness and weakness   Immunizations Administered    Name Date Dose VIS Date Route   Moderna COVID-19 Vaccine 01/22/2020  9:27 AM 0.5 mL 10/21/2019 Intramuscular   Manufacturer: Moderna   Lot: [id number]   [id number]

## 2020-02-18 ENCOUNTER — Other Ambulatory Visit: Payer: Self-pay

## 2020-02-18 ENCOUNTER — Other Ambulatory Visit: Payer: Medicare HMO | Admitting: *Deleted

## 2020-02-18 DIAGNOSIS — I2510 Atherosclerotic heart disease of native coronary artery without angina pectoris: Secondary | ICD-10-CM

## 2020-02-18 LAB — LIPID PANEL
Chol/HDL Ratio: 2.5 ratio (ref 0.0–5.0)
Cholesterol, Total: 85 mg/dL — ABNORMAL LOW (ref 100–199)
HDL: 34 mg/dL — ABNORMAL LOW (ref >39)
LDL Chol Calc (NIH): 17 mg/dL (ref 0–99)
Triglycerides: 219 mg/dL — ABNORMAL HIGH (ref 0–149)
VLDL Cholesterol Cal: 34 mg/dL (ref 5–40)

## 2020-02-18 LAB — HEPATIC FUNCTION PANEL
ALT: 27 IU/L (ref 0–44)
AST: 18 IU/L (ref 0–40)
Albumin: 4.8 g/dL (ref 3.8–4.8)
Alkaline Phosphatase: 76 IU/L (ref 39–117)
Bilirubin Total: 0.7 mg/dL (ref 0.0–1.2)
Bilirubin, Direct: 0.2 mg/dL (ref 0.00–0.40)
Total Protein: 7.1 g/dL (ref 6.0–8.5)

## 2020-02-19 ENCOUNTER — Telehealth: Payer: Self-pay

## 2020-02-19 DIAGNOSIS — E7800 Pure hypercholesterolemia, unspecified: Secondary | ICD-10-CM

## 2020-02-19 MED ORDER — FENOFIBRATE 145 MG PO TABS: 145.0000 mg | ORAL_TABLET | Freq: Every day | ORAL | 3 refills | Status: DC

## 2020-02-19 NOTE — Telephone Encounter (Signed)
The patient has been notified of the result and verbalized understanding.  All questions (if any) were answered. Antonieta Iba, RN 02/19/2020 2:11 PM  Patient will stop zetia. He states that he was fasting when he had his labs drawn - reports only having one cup of black coffee within 12 hours prior to labs. He states that he does not drink alcohol nor does he consume much sugar. He does eat bread and potatoes which I advised him to cut back on. He would like to go ahead and start on fenofibrate 145 mg daily.

## 2020-02-19 NOTE — Telephone Encounter (Signed)
-----   Message from Leeroy Bock, [facility] sent at 02/19/2020 11:52 AM EDT ----- Excellent reduction in LDL from 98 down to 17 since starting Repatha injections. Recommend continuing Repatha and rosuvastatin 20mg  daily. Can stop Zetia since his LDL will remain at goal without this.   His triglycerides have increased a bit - would confirm that pt was fasting when labs were checked. If so, encourage him to decrease intake of carbs, sugar, and alcohol to lower his TG further. If he reports compliance to all of the above, could start fenofibrate 145mg  daily and recheck lipids and LFTs in 2-3 months.

## 2020-02-25 ENCOUNTER — Ambulatory Visit: Payer: Medicare HMO | Attending: Internal Medicine

## 2020-02-25 DIAGNOSIS — Z23 Encounter for immunization: Secondary | ICD-10-CM

## 2020-02-25 NOTE — Progress Notes (Signed)
   Covid-19 Vaccination Clinic  Name:  [name]    MRN: [id number] DOB: [birth year]  02/25/2020  [name] was observed post Covid-19 immunization for 15 minutes without incident. He was provided with Vaccine Information Sheet and instruction to access the V-Safe system.   [name] was instructed to call 911 with any severe reactions post vaccine: Marland Kitchen Difficulty breathing  . Swelling of face and throat  . A fast heartbeat  . A bad rash all over body  . Dizziness and weakness   Immunizations Administered    Name Date Dose VIS Date Route   Moderna COVID-19 Vaccine 02/25/2020  9:33 AM 0.5 mL 10/21/2019 Intramuscular   Manufacturer: Levan Hurst   [id number]   [id number]

## 2020-03-02 DIAGNOSIS — H2512 Age-related nuclear cataract, left eye: Secondary | ICD-10-CM | POA: Diagnosis not present

## 2020-03-02 DIAGNOSIS — H524 Presbyopia: Secondary | ICD-10-CM | POA: Diagnosis not present

## 2020-03-11 DIAGNOSIS — M25511 Pain in right shoulder: Secondary | ICD-10-CM | POA: Diagnosis not present

## 2020-03-11 DIAGNOSIS — I10 Essential (primary) hypertension: Secondary | ICD-10-CM | POA: Diagnosis not present

## 2020-03-11 DIAGNOSIS — F324 Major depressive disorder, single episode, in partial remission: Secondary | ICD-10-CM | POA: Diagnosis not present

## 2020-03-11 DIAGNOSIS — E1169 Type 2 diabetes mellitus with other specified complication: Secondary | ICD-10-CM | POA: Diagnosis not present

## 2020-03-11 DIAGNOSIS — Z7984 Long term (current) use of oral hypoglycemic drugs: Secondary | ICD-10-CM | POA: Diagnosis not present

## 2020-03-11 DIAGNOSIS — E7800 Pure hypercholesterolemia, unspecified: Secondary | ICD-10-CM | POA: Diagnosis not present

## 2020-03-11 DIAGNOSIS — E119 Type 2 diabetes mellitus without complications: Secondary | ICD-10-CM | POA: Diagnosis not present

## 2020-04-07 DIAGNOSIS — H25812 Combined forms of age-related cataract, left eye: Secondary | ICD-10-CM | POA: Diagnosis not present

## 2020-04-07 DIAGNOSIS — H2512 Age-related nuclear cataract, left eye: Secondary | ICD-10-CM | POA: Diagnosis not present

## 2020-04-26 ENCOUNTER — Other Ambulatory Visit: Payer: Medicare HMO

## 2020-05-04 DIAGNOSIS — E119 Type 2 diabetes mellitus without complications: Secondary | ICD-10-CM | POA: Diagnosis not present

## 2020-05-04 DIAGNOSIS — I10 Essential (primary) hypertension: Secondary | ICD-10-CM | POA: Diagnosis not present

## 2020-05-04 DIAGNOSIS — E1169 Type 2 diabetes mellitus with other specified complication: Secondary | ICD-10-CM | POA: Diagnosis not present

## 2020-05-04 DIAGNOSIS — F324 Major depressive disorder, single episode, in partial remission: Secondary | ICD-10-CM | POA: Diagnosis not present

## 2020-05-04 DIAGNOSIS — E7800 Pure hypercholesterolemia, unspecified: Secondary | ICD-10-CM | POA: Diagnosis not present

## 2020-05-06 ENCOUNTER — Other Ambulatory Visit: Payer: Self-pay

## 2020-05-06 ENCOUNTER — Other Ambulatory Visit: Payer: Medicare HMO | Admitting: *Deleted

## 2020-05-06 DIAGNOSIS — E7800 Pure hypercholesterolemia, unspecified: Secondary | ICD-10-CM

## 2020-05-06 LAB — ALT: ALT: 30 IU/L (ref 0–44)

## 2020-05-06 LAB — LIPID PANEL
Chol/HDL Ratio: 3.2 ratio (ref 0.0–5.0)
Cholesterol, Total: 120 mg/dL (ref 100–199)
HDL: 37 mg/dL — ABNORMAL LOW (ref >39)
LDL Chol Calc (NIH): 38 mg/dL (ref 0–99)
Triglycerides: 297 mg/dL — ABNORMAL HIGH (ref 0–149)
VLDL Cholesterol Cal: 45 mg/dL — ABNORMAL HIGH (ref 5–40)

## 2020-05-11 ENCOUNTER — Telehealth: Payer: Self-pay | Admitting: Cardiology

## 2020-05-11 NOTE — Telephone Encounter (Signed)
New message ° ° °Patient is returning call for lab results. Please call. °

## 2020-05-11 NOTE — Telephone Encounter (Signed)
The patient has been notified of the result and verbalized understanding.  All questions (if any) were answered. Antonieta Iba, RN 05/11/2020 12:38 PM   Patient states that he did not have anything to eat or drink for about 10 hours prior to lab work. Advised patient on limiting carbs, sugars, and alcohol. He states that he does good with everything except for the carbs which is he working on. He states that he will think about starting vascepa and call us back if he decides its something that he wants to try.

## 2020-05-27 ENCOUNTER — Other Ambulatory Visit: Payer: Self-pay | Admitting: Cardiology

## 2020-06-10 DIAGNOSIS — E1169 Type 2 diabetes mellitus with other specified complication: Secondary | ICD-10-CM | POA: Diagnosis not present

## 2020-06-10 DIAGNOSIS — F324 Major depressive disorder, single episode, in partial remission: Secondary | ICD-10-CM | POA: Diagnosis not present

## 2020-06-10 DIAGNOSIS — E291 Testicular hypofunction: Secondary | ICD-10-CM | POA: Diagnosis not present

## 2020-06-10 DIAGNOSIS — M109 Gout, unspecified: Secondary | ICD-10-CM | POA: Diagnosis not present

## 2020-06-10 DIAGNOSIS — E7800 Pure hypercholesterolemia, unspecified: Secondary | ICD-10-CM | POA: Diagnosis not present

## 2020-06-10 DIAGNOSIS — Z0000 Encounter for general adult medical examination without abnormal findings: Secondary | ICD-10-CM | POA: Diagnosis not present

## 2020-06-10 DIAGNOSIS — Z125 Encounter for screening for malignant neoplasm of prostate: Secondary | ICD-10-CM | POA: Diagnosis not present

## 2020-06-10 DIAGNOSIS — I10 Essential (primary) hypertension: Secondary | ICD-10-CM | POA: Diagnosis not present

## 2020-06-10 DIAGNOSIS — R5383 Other fatigue: Secondary | ICD-10-CM | POA: Diagnosis not present

## 2020-07-01 ENCOUNTER — Encounter: Payer: Self-pay | Admitting: Podiatry

## 2020-07-01 ENCOUNTER — Ambulatory Visit: Payer: Medicare HMO | Admitting: Podiatry

## 2020-07-01 ENCOUNTER — Other Ambulatory Visit: Payer: Self-pay

## 2020-07-01 VITALS — BP 142/86 | HR 59

## 2020-07-01 DIAGNOSIS — M79675 Pain in left toe(s): Secondary | ICD-10-CM

## 2020-07-01 DIAGNOSIS — M2041 Other hammer toe(s) (acquired), right foot: Secondary | ICD-10-CM

## 2020-07-01 DIAGNOSIS — E1165 Type 2 diabetes mellitus with hyperglycemia: Secondary | ICD-10-CM | POA: Diagnosis not present

## 2020-07-01 DIAGNOSIS — M2042 Other hammer toe(s) (acquired), left foot: Secondary | ICD-10-CM | POA: Diagnosis not present

## 2020-07-01 DIAGNOSIS — B351 Tinea unguium: Secondary | ICD-10-CM | POA: Diagnosis not present

## 2020-07-01 DIAGNOSIS — M79674 Pain in right toe(s): Secondary | ICD-10-CM

## 2020-07-01 NOTE — Patient Instructions (Signed)
Diabetes Mellitus and Foot Care Foot care is an important part of your health, especially when you have diabetes. Diabetes may cause you to have problems because of poor blood flow (circulation) to your feet and legs, which can cause your skin to:  Become thinner and drier.  Break more easily.  Heal more slowly.  Peel and crack. You may also have nerve damage (neuropathy) in your legs and feet, causing decreased feeling in them. This means that you may not notice minor injuries to your feet that could lead to more serious problems. Noticing and addressing any potential problems early is the best way to prevent future foot problems. How to care for your feet Foot hygiene  Wash your feet daily with warm water and mild soap. Do not use hot water. Then, pat your feet and the areas between your toes until they are completely dry. Do not soak your feet as this can dry your skin.  Trim your toenails straight across. Do not dig under them or around the cuticle. File the edges of your nails with an emery board or nail file.  Apply a moisturizing lotion or petroleum jelly to the skin on your feet and to dry, brittle toenails. Use lotion that does not contain alcohol and is unscented. Do not apply lotion between your toes. Shoes and socks  Wear clean socks or stockings every day. Make sure they are not too tight. Do not wear knee-high stockings since they may decrease blood flow to your legs.  Wear shoes that fit properly and have enough cushioning. Always look in your shoes before you put them on to be sure there are no objects inside.  To break in new shoes, wear them for just a few hours a day. This prevents injuries on your feet. Wounds, scrapes, corns, and calluses  Check your feet daily for blisters, cuts, bruises, sores, and redness. If you cannot see the bottom of your feet, use a mirror or ask someone for help.  Do not cut corns or calluses or try to remove them with medicine.  If you  find a minor scrape, cut, or break in the skin on your feet, keep it and the skin around it clean and dry. You may clean these areas with mild soap and water. Do not clean the area with peroxide, alcohol, or iodine.  If you have a wound, scrape, corn, or callus on your foot, look at it several times a day to make sure it is healing and not infected. Check for: ? Redness, swelling, or pain. ? Fluid or blood. ? Warmth. ? Pus or a bad smell. General instructions  Do not cross your legs. This may decrease blood flow to your feet.  Do not use heating pads or hot water bottles on your feet. They may burn your skin. If you have lost feeling in your feet or legs, you may not know this is happening until it is too late.  Protect your feet from hot and cold by wearing shoes, such as at the beach or on hot pavement.  Schedule a complete foot exam at least once a year (annually) or more often if you have foot problems. If you have foot problems, report any cuts, sores, or bruises to your health care provider immediately. Contact a health care provider if:  You have a medical condition that increases your risk of infection and you have any cuts, sores, or bruises on your feet.  You have an injury that is not   healing.  You have redness on your legs or feet.  You feel burning or tingling in your legs or feet.  You have pain or cramps in your legs and feet.  Your legs or feet are numb.  Your feet always feel cold.  You have pain around a toenail. Get help right away if:  You have a wound, scrape, corn, or callus on your foot and: ? You have pain, swelling, or redness that gets worse. ? You have fluid or blood coming from the wound, scrape, corn, or callus. ? Your wound, scrape, corn, or callus feels warm to the touch. ? You have pus or a bad smell coming from the wound, scrape, corn, or callus. ? You have a fever. ? You have a red line going up your leg. Summary  Check your feet every day  for cuts, sores, red spots, swelling, and blisters.  Moisturize feet and legs daily.  Wear shoes that fit properly and have enough cushioning.  If you have foot problems, report any cuts, sores, or bruises to your health care provider immediately.  Schedule a complete foot exam at least once a year (annually) or more often if you have foot problems. This information is not intended to replace advice given to you by your health care provider. Make sure you discuss any questions you have with your health care provider. Document Revised: 07/30/2019 Document Reviewed: 12/08/2016 Elsevier Patient Education  2020 [employer]  

## 2020-07-01 NOTE — Progress Notes (Signed)
  Subjective:  Patient ID: [name] [name], male    DOB: [birth year],  MRN: [id number]  Chief Complaint  Patient presents with  . Diabetes    A1C  11.7, diabetic foot exam  . Nail Problem    thick painful toenails    69 y.o. male presents with the above complaint. History confirmed with patient.  His nails are very thick and painful, is unable to cut them at home.  Objective:  Physical Exam: warm, good capillary refill, no trophic changes or ulcerative lesions, normal DP and PT pulses and normal sensory exam.  Onychomycosis noted, painful with pressure  Assessment:   1. Onychomycosis   2. Pain due to onychomycosis of toenails of both feet   3. Uncontrolled type 2 diabetes mellitus with hyperglycemia ([place])   4. Hammertoe of right foot   5. Hammertoe of left foot      Plan:  Patient was evaluated and treated and all questions answered.  Discussed the etiology and treatment options for the condition in detail with the patient. Educated patient on the topical and oral treatment options for mycotic nails. Recommended debridement of the nails today. Sharp and mechanical debridement performed of all painful and mycotic nails today. Nails debrided in length and thickness using a nail nipper and a mechanical burr to level of comfort. Discussed treatment options including appropriate shoe gear. Follow up as needed for painful nails.  Patient educated on diabetes. Discussed proper diabetic foot care and discussed risks and complications of disease. Educated patient in depth on reasons to return to the office immediately should he/she discover anything concerning or new on the feet. All questions answered. Discussed proper shoes as well.  I recommended that he get extra-depth diabetic shoes with accommodative inserts.  He said he will consider this for the future.  Return in about 3 months (around 10/01/2020) for diabetic nail trim.

## 2020-07-14 DIAGNOSIS — E7800 Pure hypercholesterolemia, unspecified: Secondary | ICD-10-CM | POA: Diagnosis not present

## 2020-07-14 DIAGNOSIS — E1169 Type 2 diabetes mellitus with other specified complication: Secondary | ICD-10-CM | POA: Diagnosis not present

## 2020-07-14 DIAGNOSIS — F324 Major depressive disorder, single episode, in partial remission: Secondary | ICD-10-CM | POA: Diagnosis not present

## 2020-07-14 DIAGNOSIS — I10 Essential (primary) hypertension: Secondary | ICD-10-CM | POA: Diagnosis not present

## 2020-08-27 ENCOUNTER — Other Ambulatory Visit: Payer: Self-pay | Admitting: Neurology

## 2020-10-07 ENCOUNTER — Encounter: Payer: Self-pay | Admitting: Podiatry

## 2020-10-07 ENCOUNTER — Other Ambulatory Visit: Payer: Self-pay

## 2020-10-07 ENCOUNTER — Ambulatory Visit: Payer: Medicare HMO | Admitting: Podiatry

## 2020-10-07 DIAGNOSIS — M2041 Other hammer toe(s) (acquired), right foot: Secondary | ICD-10-CM | POA: Diagnosis not present

## 2020-10-07 DIAGNOSIS — M79674 Pain in right toe(s): Secondary | ICD-10-CM | POA: Diagnosis not present

## 2020-10-07 DIAGNOSIS — B351 Tinea unguium: Secondary | ICD-10-CM | POA: Diagnosis not present

## 2020-10-07 DIAGNOSIS — E1165 Type 2 diabetes mellitus with hyperglycemia: Secondary | ICD-10-CM | POA: Diagnosis not present

## 2020-10-07 DIAGNOSIS — M2042 Other hammer toe(s) (acquired), left foot: Secondary | ICD-10-CM | POA: Diagnosis not present

## 2020-10-07 DIAGNOSIS — L84 Corns and callosities: Secondary | ICD-10-CM

## 2020-10-07 DIAGNOSIS — R52 Pain, unspecified: Secondary | ICD-10-CM

## 2020-10-07 DIAGNOSIS — M79675 Pain in left toe(s): Secondary | ICD-10-CM | POA: Diagnosis not present

## 2020-10-07 DIAGNOSIS — E1142 Type 2 diabetes mellitus with diabetic polyneuropathy: Secondary | ICD-10-CM

## 2020-10-07 NOTE — Progress Notes (Signed)
  Subjective:  Patient ID: [name] [name], male    DOB: 16-Feb-1951,  MRN: [id number]  Chief Complaint  Patient presents with  . Nail Problem    thick painful toenails, 3 month follow up  . Diabetes    A1C  11.7    69 y.o. male returns with the above complaint. History confirmed with patient.  His nails are very thick and painful, is unable to cut them at home.  Objective:  Physical Exam: warm, good capillary refill, no trophic changes or ulcerative lesions, normal DP and PT pulses, he reports paresthesias at home, he has decreased protective sensation.  Onychomycosis noted, painful with pressure hyperkeratoses medial plantar heel bilaterally, medial hallux and distal hallux left and right respectively  Assessment:   No diagnosis found.   Plan:  Patient was evaluated and treated and all questions answered.  Discussed the etiology and treatment options for the condition in detail with the patient. Educated patient on the topical and oral treatment options for mycotic nails. Recommended debridement of the nails today. Sharp and mechanical debridement performed of all painful and mycotic nails today. Nails debrided in length and thickness using a nail nipper and a mechanical burr to level of comfort. Discussed treatment options including appropriate shoe gear. Follow up as needed for painful nails.  Patient educated on diabetes. Discussed proper diabetic foot care and discussed risks and complications of disease. Educated patient in depth on reasons to return to the office immediately should he/she discover anything concerning or new on the feet. All questions answered. Discussed proper shoes as well.  I recommended that he get extra-depth diabetic shoes with accommodative inserts.  He said he will consider this for the future.  All symptomatic hyperkeratoses were safely debrided with a sterile #15 blade to patient's level of comfort without incident. We discussed preventative and palliative  care of these lesions including supportive and accommodative shoegear, padding, prefabricated and custom molded accommodative orthoses, use of a pumice stone and lotions/creams daily.   Return in about 3 months (around 01/07/2021) for diabetic nail trim.

## 2020-10-07 NOTE — Patient Instructions (Signed)
Look for urea 40% cream or ointment and apply to the thickened dry skin / calluses. This can be bought over the counter, at a pharmacy or online such as [employer].  

## 2020-10-13 DIAGNOSIS — M25511 Pain in right shoulder: Secondary | ICD-10-CM | POA: Diagnosis not present

## 2020-10-18 DIAGNOSIS — E1169 Type 2 diabetes mellitus with other specified complication: Secondary | ICD-10-CM | POA: Diagnosis not present

## 2020-10-18 DIAGNOSIS — E7800 Pure hypercholesterolemia, unspecified: Secondary | ICD-10-CM | POA: Diagnosis not present

## 2020-10-18 DIAGNOSIS — I10 Essential (primary) hypertension: Secondary | ICD-10-CM | POA: Diagnosis not present

## 2020-10-18 DIAGNOSIS — F324 Major depressive disorder, single episode, in partial remission: Secondary | ICD-10-CM | POA: Diagnosis not present

## 2020-11-16 DIAGNOSIS — Z23 Encounter for immunization: Secondary | ICD-10-CM | POA: Diagnosis not present

## 2020-11-16 DIAGNOSIS — K635 Polyp of colon: Secondary | ICD-10-CM | POA: Diagnosis not present

## 2020-11-16 DIAGNOSIS — R198 Other specified symptoms and signs involving the digestive system and abdomen: Secondary | ICD-10-CM | POA: Diagnosis not present

## 2020-11-16 DIAGNOSIS — E1169 Type 2 diabetes mellitus with other specified complication: Secondary | ICD-10-CM | POA: Diagnosis not present

## 2020-12-16 ENCOUNTER — Other Ambulatory Visit (HOSPITAL_COMMUNITY): Payer: Medicare HMO

## 2021-01-06 ENCOUNTER — Ambulatory Visit: Payer: Medicare HMO | Admitting: Podiatry

## 2021-01-17 DIAGNOSIS — E1169 Type 2 diabetes mellitus with other specified complication: Secondary | ICD-10-CM | POA: Diagnosis not present

## 2021-01-17 DIAGNOSIS — E7800 Pure hypercholesterolemia, unspecified: Secondary | ICD-10-CM | POA: Diagnosis not present

## 2021-01-17 DIAGNOSIS — F324 Major depressive disorder, single episode, in partial remission: Secondary | ICD-10-CM | POA: Diagnosis not present

## 2021-01-17 DIAGNOSIS — I10 Essential (primary) hypertension: Secondary | ICD-10-CM | POA: Diagnosis not present

## 2021-01-28 ENCOUNTER — Inpatient Hospital Stay (HOSPITAL_COMMUNITY)
Admission: EM | Disposition: A | Discharge status: Home / Self Care | Payer: Self-pay | Source: Home / Self Care | Attending: Interventional Cardiology

## 2021-01-28 ENCOUNTER — Ambulatory Visit (HOSPITAL_COMMUNITY): Admit: 2021-01-28 | Payer: Medicare HMO | Admitting: Interventional Cardiology

## 2021-01-28 ENCOUNTER — Inpatient Hospital Stay (HOSPITAL_COMMUNITY): Payer: Medicare HMO

## 2021-01-28 ENCOUNTER — Encounter (HOSPITAL_COMMUNITY): Payer: Self-pay | Admitting: Interventional Cardiology

## 2021-01-28 ENCOUNTER — Inpatient Hospital Stay (HOSPITAL_COMMUNITY)
Admission: EM | Admit: 2021-01-28 | Discharge: 2021-01-31 | DRG: 282 | Disposition: A | Discharge status: Home / Self Care | Payer: Medicare HMO | Attending: Interventional Cardiology | Admitting: Interventional Cardiology

## 2021-01-28 DIAGNOSIS — I2510 Atherosclerotic heart disease of native coronary artery without angina pectoris: Secondary | ICD-10-CM

## 2021-01-28 DIAGNOSIS — I77810 Thoracic aortic ectasia: Secondary | ICD-10-CM

## 2021-01-28 DIAGNOSIS — E1159 Type 2 diabetes mellitus with other circulatory complications: Secondary | ICD-10-CM | POA: Diagnosis not present

## 2021-01-28 DIAGNOSIS — R0689 Other abnormalities of breathing: Secondary | ICD-10-CM | POA: Diagnosis not present

## 2021-01-28 DIAGNOSIS — E119 Type 2 diabetes mellitus without complications: Secondary | ICD-10-CM

## 2021-01-28 DIAGNOSIS — R400 Somnolence: Secondary | ICD-10-CM | POA: Diagnosis not present

## 2021-01-28 DIAGNOSIS — Z7984 Long term (current) use of oral hypoglycemic drugs: Secondary | ICD-10-CM | POA: Diagnosis not present

## 2021-01-28 DIAGNOSIS — I25110 Atherosclerotic heart disease of native coronary artery with unstable angina pectoris: Secondary | ICD-10-CM | POA: Diagnosis present

## 2021-01-28 DIAGNOSIS — E785 Hyperlipidemia, unspecified: Secondary | ICD-10-CM | POA: Diagnosis present

## 2021-01-28 DIAGNOSIS — Z7952 Long term (current) use of systemic steroids: Secondary | ICD-10-CM | POA: Diagnosis not present

## 2021-01-28 DIAGNOSIS — I10 Essential (primary) hypertension: Secondary | ICD-10-CM | POA: Diagnosis not present

## 2021-01-28 DIAGNOSIS — Z79899 Other long term (current) drug therapy: Secondary | ICD-10-CM

## 2021-01-28 DIAGNOSIS — Z5309 Procedure and treatment not carried out because of other contraindication: Secondary | ICD-10-CM | POA: Diagnosis not present

## 2021-01-28 DIAGNOSIS — I2119 ST elevation (STEMI) myocardial infarction involving other coronary artery of inferior wall: Secondary | ICD-10-CM

## 2021-01-28 DIAGNOSIS — R2681 Unsteadiness on feet: Secondary | ICD-10-CM | POA: Diagnosis present

## 2021-01-28 DIAGNOSIS — R251 Tremor, unspecified: Secondary | ICD-10-CM | POA: Diagnosis present

## 2021-01-28 DIAGNOSIS — I350 Nonrheumatic aortic (valve) stenosis: Secondary | ICD-10-CM | POA: Diagnosis not present

## 2021-01-28 DIAGNOSIS — Z955 Presence of coronary angioplasty implant and graft: Secondary | ICD-10-CM | POA: Diagnosis not present

## 2021-01-28 DIAGNOSIS — E7800 Pure hypercholesterolemia, unspecified: Secondary | ICD-10-CM | POA: Diagnosis present

## 2021-01-28 DIAGNOSIS — Z20822 Contact with and (suspected) exposure to covid-19: Secondary | ICD-10-CM | POA: Diagnosis present

## 2021-01-28 DIAGNOSIS — I2111 ST elevation (STEMI) myocardial infarction involving right coronary artery: Secondary | ICD-10-CM

## 2021-01-28 DIAGNOSIS — Z794 Long term (current) use of insulin: Secondary | ICD-10-CM | POA: Diagnosis not present

## 2021-01-28 DIAGNOSIS — E1169 Type 2 diabetes mellitus with other specified complication: Secondary | ICD-10-CM | POA: Diagnosis not present

## 2021-01-28 DIAGNOSIS — Z7982 Long term (current) use of aspirin: Secondary | ICD-10-CM

## 2021-01-28 DIAGNOSIS — R079 Chest pain, unspecified: Secondary | ICD-10-CM

## 2021-01-28 DIAGNOSIS — R0789 Other chest pain: Secondary | ICD-10-CM | POA: Diagnosis not present

## 2021-01-28 DIAGNOSIS — R231 Pallor: Secondary | ICD-10-CM | POA: Diagnosis not present

## 2021-01-28 DIAGNOSIS — I213 ST elevation (STEMI) myocardial infarction of unspecified site: Secondary | ICD-10-CM | POA: Diagnosis not present

## 2021-01-28 HISTORY — PX: LEFT HEART CATH AND CORONARY ANGIOGRAPHY: CATH118249

## 2021-01-28 LAB — TROPONIN I (HIGH SENSITIVITY)
Troponin I (High Sensitivity): 101 ng/L (ref <18)
Troponin I (High Sensitivity): 254 ng/L (ref <18)
Troponin I (High Sensitivity): 462 ng/L (ref <18)

## 2021-01-28 LAB — ECHOCARDIOGRAM COMPLETE
AR max vel: 2.53 cm2
AV Area VTI: 2.44 cm2
AV Area mean vel: 2.46 cm2
AV Mean grad: 11 mmHg
AV Peak grad: 19.2 mmHg
Ao pk vel: 2.19 m/s
Area-P 1/2: 3.31 cm2
Calc EF: 51.1 %
Height: 70 in
P 1/2 time: 701 msec
S' Lateral: 3.2 cm
Single Plane A2C EF: 55.4 %
Single Plane A4C EF: 45.5 %
Weight: 3776 oz

## 2021-01-28 LAB — POCT ACTIVATED CLOTTING TIME
Activated Clotting Time: 196 seconds
Activated Clotting Time: 261 seconds

## 2021-01-28 LAB — COMPREHENSIVE METABOLIC PANEL
ALT: 19 U/L (ref 0–44)
AST: 25 U/L (ref 15–41)
Albumin: 3.7 g/dL (ref 3.5–5.0)
Alkaline Phosphatase: 48 U/L (ref 38–126)
Anion gap: 10 (ref 5–15)
BUN: 10 mg/dL (ref 8–23)
CO2: 24 mmol/L (ref 22–32)
Calcium: 9.4 mg/dL (ref 8.9–10.3)
Chloride: 100 mmol/L (ref 98–111)
Creatinine, Ser: 1.04 mg/dL (ref 0.61–1.24)
GFR, Estimated: >60 mL/min (ref >60)
Glucose, Bld: 272 mg/dL — ABNORMAL HIGH (ref 70–99)
Potassium: 3.9 mmol/L (ref 3.5–5.1)
Sodium: 134 mmol/L — ABNORMAL LOW (ref 135–145)
Total Bilirubin: 1 mg/dL (ref 0.3–1.2)
Total Protein: 6.3 g/dL — ABNORMAL LOW (ref 6.5–8.1)

## 2021-01-28 LAB — GLUCOSE, CAPILLARY
Glucose-Capillary: 234 mg/dL — ABNORMAL HIGH (ref 70–99)
Glucose-Capillary: 205 mg/dL — ABNORMAL HIGH (ref 70–99)
Glucose-Capillary: 230 mg/dL — ABNORMAL HIGH (ref 70–99)

## 2021-01-28 LAB — CBC
HCT: 43.1 % (ref 39.0–52.0)
Hemoglobin: 15.6 g/dL (ref 13.0–17.0)
MCH: 31.3 pg (ref 26.0–34.0)
MCHC: 36.2 g/dL — ABNORMAL HIGH (ref 30.0–36.0)
MCV: 86.5 fL (ref 80.0–100.0)
Platelets: 143 10*3/uL — ABNORMAL LOW (ref 150–400)
RBC: 4.98 MIL/uL (ref 4.22–5.81)
RDW: 12.9 % (ref 11.5–15.5)
WBC: 8.1 10*3/uL (ref 4.0–10.5)
nRBC: 0 % (ref 0.0–0.2)

## 2021-01-28 LAB — POCT I-STAT, CHEM 8
BUN: 13 mg/dL (ref 8–23)
Calcium, Ion: 1.3 mmol/L (ref 1.15–1.40)
Chloride: 101 mmol/L (ref 98–111)
Creatinine, Ser: 0.9 mg/dL (ref 0.61–1.24)
Glucose, Bld: 281 mg/dL — ABNORMAL HIGH (ref 70–99)
HCT: 43 % (ref 39.0–52.0)
Hemoglobin: 14.6 g/dL (ref 13.0–17.0)
Potassium: 4 mmol/L (ref 3.5–5.1)
Sodium: 137 mmol/L (ref 135–145)
TCO2: 24 mmol/L (ref 22–32)

## 2021-01-28 LAB — LIPID PANEL
Cholesterol: 224 mg/dL — ABNORMAL HIGH (ref 0–200)
HDL: 36 mg/dL — ABNORMAL LOW (ref >40)
LDL Cholesterol: 144 mg/dL — ABNORMAL HIGH (ref 0–99)
Total CHOL/HDL Ratio: 6.2 RATIO
Triglycerides: 218 mg/dL — ABNORMAL HIGH (ref <150)
VLDL: 44 mg/dL — ABNORMAL HIGH (ref 0–40)

## 2021-01-28 LAB — PROTIME-INR
INR: 1.1 (ref 0.8–1.2)
Prothrombin Time: 13.3 seconds (ref 11.4–15.2)

## 2021-01-28 LAB — SARS CORONAVIRUS 2 (TAT 6-24 HRS): SARS Coronavirus 2: NEGATIVE

## 2021-01-28 LAB — HEMOGLOBIN A1C
Hgb A1c MFr Bld: 9.6 % — ABNORMAL HIGH (ref 4.8–5.6)
Mean Plasma Glucose: 228.82 mg/dL

## 2021-01-28 LAB — BRAIN NATRIURETIC PEPTIDE: B Natriuretic Peptide: 200.8 pg/mL — ABNORMAL HIGH (ref 0.0–100.0)

## 2021-01-28 LAB — APTT: aPTT: 25 seconds (ref 24–36)

## 2021-01-28 SURGERY — LEFT HEART CATH AND CORONARY ANGIOGRAPHY
Anesthesia: LOCAL

## 2021-01-28 MED ORDER — HEPARIN (PORCINE) 25000 UT/250ML-% IV SOLN
1800.0000 [IU]/h | INTRAVENOUS | Status: AC
  Administered 2021-01-28: 20:00:00 1150 [IU]/h via INTRAVENOUS
  Administered 2021-01-29: 18:00:00 1500 [IU]/h via INTRAVENOUS
  Administered 2021-01-30: 1800 [IU]/h via INTRAVENOUS
  Filled 2021-01-28 (×5): qty 250

## 2021-01-28 MED ORDER — INSULIN ASPART 100 UNIT/ML ~~LOC~~ SOLN
0.0000 [IU] | Freq: Three times a day (TID) | SUBCUTANEOUS | Status: DC
  Administered 2021-01-28 (×2): 5 [IU] via SUBCUTANEOUS
  Administered 2021-01-29: 3 [IU] via SUBCUTANEOUS
  Administered 2021-01-29 – 2021-01-30 (×4): 5 [IU] via SUBCUTANEOUS
  Administered 2021-01-30 – 2021-01-31 (×2): 3 [IU] via SUBCUTANEOUS
  Administered 2021-01-31: 5 [IU] via SUBCUTANEOUS

## 2021-01-28 MED ORDER — LIDOCAINE HCL (PF) 1 % IJ SOLN
INTRAMUSCULAR | Status: DC | PRN
  Administered 2021-01-28: 5 mL

## 2021-01-28 MED ORDER — ASPIRIN EC 81 MG PO TBEC
81.0000 mg | DELAYED_RELEASE_TABLET | Freq: Every day | ORAL | Status: DC
  Administered 2021-01-29 – 2021-01-31 (×3): 81 mg via ORAL
  Filled 2021-01-28 (×3): qty 1

## 2021-01-28 MED ORDER — SODIUM CHLORIDE 0.9 % IV SOLN: INTRAVENOUS | Status: AC

## 2021-01-28 MED ORDER — NITROGLYCERIN IN D5W 200-5 MCG/ML-% IV SOLN
0.0000 ug/min | INTRAVENOUS | Status: DC
  Administered 2021-01-28: 60 ug/min via INTRAVENOUS
  Administered 2021-01-28: 80 ug/min via INTRAVENOUS
  Filled 2021-01-28: qty 250

## 2021-01-28 MED ORDER — INSULIN GLARGINE 100 UNIT/ML ~~LOC~~ SOLN
10.0000 [IU] | Freq: Every day | SUBCUTANEOUS | Status: DC
  Administered 2021-01-29 – 2021-01-31 (×3): 10 [IU] via SUBCUTANEOUS
  Filled 2021-01-28 (×3): qty 0.1

## 2021-01-28 MED ORDER — CARVEDILOL 3.125 MG PO TABS
6.2500 mg | ORAL_TABLET | Freq: Two times a day (BID) | ORAL | Status: DC
  Administered 2021-01-28 – 2021-01-31 (×7): 6.25 mg via ORAL
  Filled 2021-01-28 (×7): qty 2

## 2021-01-28 MED ORDER — NITROGLYCERIN IN D5W 200-5 MCG/ML-% IV SOLN
INTRAVENOUS | Status: AC
  Filled 2021-01-28: qty 250

## 2021-01-28 MED ORDER — LORAZEPAM 2 MG/ML IJ SOLN
1.0000 mg | Freq: Four times a day (QID) | INTRAMUSCULAR | Status: DC
  Administered 2021-01-28 – 2021-01-29 (×3): 1 mg via INTRAVENOUS
  Filled 2021-01-28 (×3): qty 1

## 2021-01-28 MED ORDER — MIDAZOLAM HCL 2 MG/2ML IJ SOLN
INTRAMUSCULAR | Status: AC
  Filled 2021-01-28: qty 2

## 2021-01-28 MED ORDER — SODIUM CHLORIDE 0.9 % IV SOLN
INTRAVENOUS | Status: AC | PRN
Start: 2021-01-28 — End: 2021-01-28
  Administered 2021-01-28: 10 mL/h via INTRAVENOUS

## 2021-01-28 MED ORDER — FENOFIBRATE 54 MG PO TABS
54.0000 mg | ORAL_TABLET | Freq: Every day | ORAL | Status: DC
  Administered 2021-01-28 – 2021-01-31 (×4): 54 mg via ORAL
  Filled 2021-01-28 (×4): qty 1

## 2021-01-28 MED ORDER — HYDRALAZINE HCL 20 MG/ML IJ SOLN: 10.0000 mg | INTRAMUSCULAR | Status: AC | PRN

## 2021-01-28 MED ORDER — HEPARIN (PORCINE) IN NACL 1000-0.9 UT/500ML-% IV SOLN
INTRAVENOUS | Status: AC
  Filled 2021-01-28: qty 1000

## 2021-01-28 MED ORDER — NITROGLYCERIN IN D5W 200-5 MCG/ML-% IV SOLN
INTRAVENOUS | Status: AC | PRN
  Administered 2021-01-28: 20 ug/min via INTRAVENOUS

## 2021-01-28 MED ORDER — HEPARIN SODIUM (PORCINE) 1000 UNIT/ML IJ SOLN
INTRAMUSCULAR | Status: AC
  Filled 2021-01-28: qty 1

## 2021-01-28 MED ORDER — MIDAZOLAM HCL 2 MG/2ML IJ SOLN
INTRAMUSCULAR | Status: DC | PRN
  Administered 2021-01-28: 1 mg via INTRAVENOUS

## 2021-01-28 MED ORDER — BUPROPION HCL ER (XL) 300 MG PO TB24
300.0000 mg | ORAL_TABLET | Freq: Every day | ORAL | Status: DC
  Administered 2021-01-28 – 2021-01-31 (×4): 300 mg via ORAL
  Filled 2021-01-28 (×4): qty 1

## 2021-01-28 MED ORDER — VERAPAMIL HCL 2.5 MG/ML IV SOLN
INTRAVENOUS | Status: DC | PRN
  Administered 2021-01-28: 10 mL via INTRA_ARTERIAL

## 2021-01-28 MED ORDER — HEPARIN (PORCINE) IN NACL 1000-0.9 UT/500ML-% IV SOLN
INTRAVENOUS | Status: DC | PRN
  Administered 2021-01-28 (×3): 500 mL

## 2021-01-28 MED ORDER — OXYCODONE HCL 5 MG PO TABS
5.0000 mg | ORAL_TABLET | ORAL | Status: DC | PRN
  Administered 2021-01-28: 10 mg via ORAL
  Filled 2021-01-28: qty 2

## 2021-01-28 MED ORDER — HEPARIN (PORCINE) IN NACL 1000-0.9 UT/500ML-% IV SOLN
INTRAVENOUS | Status: AC
  Filled 2021-01-28: qty 500

## 2021-01-28 MED ORDER — CLOPIDOGREL BISULFATE 300 MG PO TABS
ORAL_TABLET | ORAL | Status: DC | PRN
  Administered 2021-01-28: 600 mg via ORAL

## 2021-01-28 MED ORDER — LABETALOL HCL 5 MG/ML IV SOLN: 10.0000 mg | INTRAVENOUS | Status: AC | PRN

## 2021-01-28 MED ORDER — EVOLOCUMAB 140 MG/ML ~~LOC~~ SOAJ: 1.0000 "pen " | SUBCUTANEOUS | Status: DC

## 2021-01-28 MED ORDER — NITROGLYCERIN 1 MG/10 ML FOR IR/CATH LAB
INTRA_ARTERIAL | Status: AC
  Filled 2021-01-28: qty 10

## 2021-01-28 MED ORDER — FAMOTIDINE IN NACL 20-0.9 MG/50ML-% IV SOLN
INTRAVENOUS | Status: AC | PRN
  Administered 2021-01-28: 20 mg via INTRAVENOUS

## 2021-01-28 MED ORDER — LIDOCAINE HCL (PF) 1 % IJ SOLN
INTRAMUSCULAR | Status: AC
  Filled 2021-01-28: qty 30

## 2021-01-28 MED ORDER — FAMOTIDINE IN NACL 20-0.9 MG/50ML-% IV SOLN
INTRAVENOUS | Status: AC
  Filled 2021-01-28: qty 50

## 2021-01-28 MED ORDER — ACETAMINOPHEN 325 MG PO TABS
650.0000 mg | ORAL_TABLET | ORAL | Status: DC | PRN
  Administered 2021-01-28 – 2021-01-30 (×2): 650 mg via ORAL
  Filled 2021-01-28 (×2): qty 2

## 2021-01-28 MED ORDER — HEPARIN SODIUM (PORCINE) 1000 UNIT/ML IJ SOLN
INTRAMUSCULAR | Status: DC | PRN
  Administered 2021-01-28 (×2): 5000 [IU] via INTRAVENOUS
  Administered 2021-01-28: 2000 [IU] via INTRAVENOUS

## 2021-01-28 MED ORDER — VERAPAMIL HCL 2.5 MG/ML IV SOLN
INTRAVENOUS | Status: AC
  Filled 2021-01-28: qty 2

## 2021-01-28 MED ORDER — ROSUVASTATIN CALCIUM 20 MG PO TABS
40.0000 mg | ORAL_TABLET | Freq: Every day | ORAL | Status: DC
  Administered 2021-01-28 – 2021-01-31 (×4): 40 mg via ORAL
  Filled 2021-01-28 (×4): qty 2

## 2021-01-28 MED ORDER — EZETIMIBE 10 MG PO TABS: 10.0000 mg | ORAL_TABLET | Freq: Every day | ORAL | Status: DC

## 2021-01-28 MED ORDER — FENTANYL CITRATE (PF) 100 MCG/2ML IJ SOLN
50.0000 ug | INTRAMUSCULAR | Status: DC | PRN
  Administered 2021-01-28: 50 ug via INTRAVENOUS
  Filled 2021-01-28: qty 2

## 2021-01-28 MED ORDER — FENTANYL CITRATE (PF) 100 MCG/2ML IJ SOLN
INTRAMUSCULAR | Status: DC | PRN
  Administered 2021-01-28 (×2): 25 ug via INTRAVENOUS

## 2021-01-28 MED ORDER — CLOPIDOGREL BISULFATE 300 MG PO TABS
ORAL_TABLET | ORAL | Status: AC
  Filled 2021-01-28: qty 2

## 2021-01-28 MED ORDER — ASPIRIN 81 MG PO CHEW: 81.0000 mg | CHEWABLE_TABLET | Freq: Every day | ORAL | Status: DC

## 2021-01-28 MED ORDER — SODIUM CHLORIDE 0.9% FLUSH: 3.0000 mL | INTRAVENOUS | Status: DC | PRN

## 2021-01-28 MED ORDER — SODIUM CHLORIDE 0.9 % IV SOLN: 250.0000 mL | INTRAVENOUS | Status: DC | PRN

## 2021-01-28 MED ORDER — FENTANYL CITRATE (PF) 100 MCG/2ML IJ SOLN
INTRAMUSCULAR | Status: AC
  Filled 2021-01-28: qty 2

## 2021-01-28 MED ORDER — SODIUM CHLORIDE 0.9% FLUSH
3.0000 mL | Freq: Two times a day (BID) | INTRAVENOUS | Status: DC
  Administered 2021-01-28 – 2021-01-31 (×6): 3 mL via INTRAVENOUS

## 2021-01-28 MED ORDER — CLOPIDOGREL BISULFATE 75 MG PO TABS
75.0000 mg | ORAL_TABLET | Freq: Every day | ORAL | Status: DC
  Administered 2021-01-29 – 2021-01-31 (×3): 75 mg via ORAL
  Filled 2021-01-28 (×3): qty 1

## 2021-01-28 MED ORDER — IOHEXOL 350 MG/ML SOLN
INTRAVENOUS | Status: DC | PRN
  Administered 2021-01-28: 130 mL

## 2021-01-28 MED ORDER — ONDANSETRON HCL 4 MG/2ML IJ SOLN: 4.0000 mg | Freq: Four times a day (QID) | INTRAMUSCULAR | Status: DC | PRN

## 2021-01-28 MED ORDER — IOHEXOL 350 MG/ML SOLN
INTRAVENOUS | Status: AC
  Filled 2021-01-28: qty 1

## 2021-01-28 SURGICAL SUPPLY — 14 items
CATH 5FR JL3.5 JR4 ANG PIG MP (CATHETERS) ×2 IMPLANT
CATH LAUNCHER 5F EBU3.5 (CATHETERS) ×2 IMPLANT
CATH VISTA GUIDE 6FR JR4 (CATHETERS) ×2 IMPLANT
DEVICE RAD COMP TR BAND LRG (VASCULAR PRODUCTS) ×2 IMPLANT
ELECT DEFIB PAD ADLT CADENCE (PAD) ×2 IMPLANT
GLIDESHEATH SLEND A-KIT 6F 22G (SHEATH) ×2 IMPLANT
GUIDEWIRE INQWIRE 1.5J.035X260 (WIRE) ×1 IMPLANT
INQWIRE 1.5J .035X260CM (WIRE) ×2
KIT ENCORE 26 ADVANTAGE (KITS) ×2 IMPLANT
KIT HEART LEFT (KITS) ×2 IMPLANT
PACK CARDIAC CATHETERIZATION (CUSTOM PROCEDURE TRAY) ×2 IMPLANT
TRANSDUCER W/STOPCOCK (MISCELLANEOUS) ×2 IMPLANT
TUBING CIL FLEX 10 FLL-RA (TUBING) ×2 IMPLANT
WIRE ASAHI PROWATER 180CM (WIRE) ×2 IMPLANT

## 2021-01-28 NOTE — Progress Notes (Signed)
   Went to check on patient after he went to [facility] unit following cath for STEMI. He is still having some chest pain but states it is much improved. He appears comfortably. Nitro drip just up-titrated to 65mcg/min. Advised patient to let us know if he is still having pain in a little while. He does have PRN Fentanyl ordered but has not required this yet.  Confirmed what medications he was taking at home for his cholesterol. He states he is no longer taking Zetia. This was reportedly stopped when he started Repatha. However, he is still taking Crestor 20mg  daily as well as fenofibrate. Lipid panel today showed: Total Cholesterol 224, Triglycerdies 218, HDL 36, LDL 144 (however, he was not fasting). Will start Crestor 40mg  daily here.  Hemoglobin A1c 9.6. He was placed on sliding scale insulin here. Will consult diabetes coordinator.  BP elevated after cath. He was not started on all of his home medications following cath as he was placed on Nitro drip. We will see how his BP does on the Nitro drip and can adjust medication accordingly.   Darreld Mclean, PA-C 01/28/2021 12:28 PM

## 2021-01-28 NOTE — Progress Notes (Signed)
Progress Note  Patient Name: [name] [name] Date of Encounter: 01/29/2021  [facility] Cardiologist: Fransico Him, MD   Subjective   Out of it this morning after receiving ativan. Denies chest pain. Right radial site c/d/i.  Underwent coronary angiography yesterday found to have:  Distal RCA beyond the origin of the PDA within a tortuous segment 99% stenosis with TIMI grade II flow.  RCA.  Attempted to cross with a wire and perform angioplasty but poor wire torque ability and distal location prevented crossing.  Left main is widely patent  30 to 40% ostial to proximal LAD.  Widely patent LAD with into tortuous segment proximal to mid.  Tortuous circumflex, widely patent  Inferobasal hypokinesis.  EF 50%.  Unable to cross distal RCA due to tortuosity and located. Now on medical management.   Inpatient Medications    Scheduled Meds: . aspirin EC  81 mg Oral Daily  . buPROPion  300 mg Oral Daily  . carvedilol  6.25 mg Oral BID WC  . Chlorhexidine Gluconate Cloth  6 each Topical Daily  . clopidogrel  75 mg Oral Q breakfast  . fenofibrate  54 mg Oral Daily  . insulin aspart  0-15 Units Subcutaneous TID WC  . insulin glargine  10 Units Subcutaneous Daily  . LORazepam  1 mg Intravenous Q6H  . rosuvastatin  40 mg Oral Daily  . sodium chloride flush  3 mL Intravenous Q12H   Continuous Infusions: . sodium chloride    . heparin 1,300 Units/hr (01/29/21 0800)  . nitroGLYCERIN Stopped (01/29/21 0212)   PRN Meds: sodium chloride, acetaminophen, fentaNYL (SUBLIMAZE) injection, ondansetron (ZOFRAN) IV, oxyCODONE, sodium chloride flush   Vital Signs    Vitals:   01/29/21 0700 01/29/21 0800 01/29/21 0810 01/29/21 1000  BP: (!) 164/89  (!) 164/94   Pulse:      Resp: (!) 26 17 (!) 21 (!) 28  Temp:  98.8 F (37.1 C)    TempSrc:      SpO2: 95% 94% 94%   Weight:        Intake/Output Summary (Last 24 hours) at 01/29/2021 1046 Last data filed at 01/29/2021 0850 Gross per  24 hour  Intake 1157.53 ml  Output 1475 ml  Net -317.47 ml   Last 3 Weights 01/29/2021 01/28/2021 11/28/2019  Weight (lbs) 205 lb 14.6 oz 236 lb 235 lb  Weight (kg) 93.4 kg 107.049 kg 106.595 kg      Telemetry    NSR - Personally Reviewed  ECG    NSR, LVH, inferior infarct- Personally Reviewed  Physical Exam   GEN: Somnolent but arousable.   Neck: No JVD Cardiac: RRR, no murmurs, rubs, or gallops. Right radial cath site c/d/i  Respiratory: Clear to auscultation bilaterally. GI: Soft, nontender, non-distended  MS: No edema; No deformity. Neuro:  Nonfocal  Psych: Tired and "out-of-it" after ativan  Labs    High Sensitivity Troponin:   Recent Labs  Lab 01/28/21 1029 01/28/21 1356 01/28/21 1608  TROPONINIHS 101* 254* 462*      Chemistry Recent Labs  Lab 01/28/21 1029 01/29/21 0211  NA 137  134* 136  K 4.0  3.9 3.6  CL 101  100 101  CO2 24 25  GLUCOSE 281*  272* 242*  BUN 13  10 7*  CREATININE 0.90  1.04 1.03  CALCIUM 9.4 9.5  PROT 6.3*  --   ALBUMIN 3.7  --   AST 25  --   ALT 19  --  ALKPHOS 48  --   BILITOT 1.0  --   GFRNONAA >60 >60  ANIONGAP 10 10     Hematology Recent Labs  Lab 01/28/21 1029 01/29/21 0211  WBC 8.1 10.4  RBC 4.98 4.81  HGB 14.6  15.6 15.1  HCT 43.0  43.1 41.7  MCV 86.5 86.7  MCH 31.3 31.4  MCHC 36.2* 36.2*  RDW 12.9 12.9  PLT 143* 145*    BNP Recent Labs  Lab 01/28/21 1713  BNP 200.8*     DDimer No results for input(s): DDIMER in the last 168 hours.   Radiology    CARDIAC CATHETERIZATION  Result Date: 01/28/2021  RCA continuation 99% stenosis before the origin of 2 left ventricular branches that are relatively small.  TIMI grade II flow was noted.  Attempt to angioplasty the vessel was unsuccessful as we were unable to cross with a wire.  This resulted in total occlusion of the vessel with recurrence of more substantial symptoms and EKG changes.  Left main is widely patent.  LAD stent is widely patent  and tortuous.  Proximal to the LAD stent there is native 30 to 40% narrowing.  Circumflex is widely patent and tortuous.  Inferobasal hypokinesis.  EF 60%.  LVEDP normal. RECOMMENDATIONS:  IV hydration  IV nitroglycerin  Resume heparin for 48 hours  Aspirin and Plavix  Beta-blocker therapy  Analgesia with fentanyl transitioning to morphine.   ECHOCARDIOGRAM COMPLETE  Result Date: 01/28/2021    ECHOCARDIOGRAM REPORT   Patient Name:   [name] Date of Exam: 01/28/2021 Medical Rec #:  [id number]     Height:       70.0 in Accession #:    [id number]    Weight:       236.0 lb Date of Birth:  [birth year]     BSA:          2.239 m Patient Age:    70 years      BP:           180/103 mmHg Patient Gender: M             HR:           69 bpm. Exam Location:  Inpatient Procedure: 2D Echo, Cardiac Doppler and Color Doppler                               MODIFIED REPORT: This report was modified by Cherlynn Kaiser MD on 01/28/2021 due to revision.  Indications:     Chest Pain R07.9  History:         Patient has prior history of Echocardiogram examinations, most                  recent 12/16/2019. CAD; Risk Factors:Hypertension, Dyslipidemia,                  Diabetes and Non-Smoker. Dilated aortic root. Mild AS.  Sonographer:     Vickie Epley RDCS Referring Phys:  [id number] Darreld Mclean Diagnosing Phys: Cherlynn Kaiser MD IMPRESSIONS  1. Left ventricular ejection fraction, by estimation, is 60 to 65%. The left ventricle has normal function. The left ventricle demonstrates regional wall motion abnormalities (see scoring diagram/findings for description). There is mild left ventricular  hypertrophy. Left ventricular diastolic parameters are consistent with Grade I diastolic dysfunction (impaired relaxation).  2. Right ventricular systolic function is normal. The right ventricular size is normal. There is normal  pulmonary artery systolic pressure.  3. The mitral valve is normal in structure. Trivial mitral valve  regurgitation. No evidence of mitral stenosis.  4. The aortic valve is abnormal. There is moderate calcification of the aortic valve. Aortic valve regurgitation is trivial. Mild aortic valve stenosis. Aortic valve mean gradient measures 11.0 mmHg.  5. Aortic dilatation noted. There is mild dilatation of the ascending aorta, measuring 41 mm.  6. The inferior vena cava is normal in size with greater than 50% respiratory variability, suggesting right atrial pressure of 3 mmHg. FINDINGS  Left Ventricle: Left ventricular ejection fraction, by estimation, is 60 to 65%. The left ventricle has normal function. The left ventricle demonstrates regional wall motion abnormalities. The left ventricular internal cavity size was normal in size. There is mild left ventricular hypertrophy. Left ventricular diastolic parameters are consistent with Grade I diastolic dysfunction (impaired relaxation).  LV Wall Scoring: The basal inferolateral segment and basal inferior segment are hypokinetic. Right Ventricle: The right ventricular size is normal. No increase in right ventricular wall thickness. Right ventricular systolic function is normal. There is normal pulmonary artery systolic pressure. The tricuspid regurgitant velocity is 1.94 m/s, and  with an assumed right atrial pressure of 3 mmHg, the estimated right ventricular systolic pressure is 51.8 mmHg. Left Atrium: Left atrial size was normal in size. Right Atrium: Right atrial size was normal in size. Pericardium: There is no evidence of pericardial effusion. Mitral Valve: The mitral valve is normal in structure. Trivial mitral valve regurgitation. No evidence of mitral valve stenosis. Tricuspid Valve: The tricuspid valve is normal in structure. Tricuspid valve regurgitation is not demonstrated. No evidence of tricuspid stenosis. Aortic Valve: The aortic valve is abnormal. There is moderate calcification of the aortic valve. Aortic valve regurgitation is trivial. Aortic  regurgitation PHT measures 701 msec. Mild aortic stenosis is present. Aortic valve mean gradient measures 11.0 mmHg. Aortic valve peak gradient measures 19.2 mmHg. Aortic valve area, by VTI measures 2.44 cm. Pulmonic Valve: The pulmonic valve was grossly normal. Pulmonic valve regurgitation is trivial. No evidence of pulmonic stenosis. Aorta: Aortic dilatation noted. There is mild dilatation of the ascending aorta, measuring 41 mm. Venous: The inferior vena cava is normal in size with greater than 50% respiratory variability, suggesting right atrial pressure of 3 mmHg. IAS/Shunts: No atrial level shunt detected by color flow Doppler.  LEFT VENTRICLE PLAX 2D LVIDd:         4.20 cm  Diastology LVIDs:         3.20 cm  LV e' medial:    3.65 cm/s LV PW:         1.10 cm  LV E/e' medial:  16.0 LV IVS:        1.10 cm  LV e' lateral:   6.08 cm/s LVOT diam:     2.50 cm  LV E/e' lateral: 9.6 LV SV:         108 LV SV Index:   48 LVOT Area:     4.91 cm  RIGHT VENTRICLE RV S prime:     12.10 cm/s TAPSE (M-mode): 2.2 cm LEFT ATRIUM             Index       RIGHT ATRIUM           Index LA diam:        3.90 cm 1.74 cm/m  RA Area:     15.10 cm LA Vol (A2C):   34.4 ml 15.36 ml/m RA Volume:  36.30 ml  16.21 ml/m LA Vol (A4C):   37.5 ml 16.75 ml/m LA Biplane Vol: 38.1 ml 17.01 ml/m  AORTIC VALVE AV Area (Vmax):    2.53 cm AV Area (Vmean):   2.46 cm AV Area (VTI):     2.44 cm AV Vmax:           219.00 cm/s AV Vmean:          156.000 cm/s AV VTI:            0.440 m AV Peak Grad:      19.2 mmHg AV Mean Grad:      11.0 mmHg LVOT Vmax:         113.00 cm/s LVOT Vmean:        78.300 cm/s LVOT VTI:          0.219 m LVOT/AV VTI ratio: 0.50 AI PHT:            701 msec  AORTA Ao Root diam: 3.40 cm Ao Asc diam:  4.10 cm MITRAL VALVE               TRICUSPID VALVE MV Area (PHT): 3.31 cm    TR Peak grad:   15.1 mmHg MV Decel Time: 229 msec    TR Vmax:        194.00 cm/s MV E velocity: 58.30 cm/s MV A velocity: 91.90 cm/s  SHUNTS MV E/A  ratio:  0.63        Systemic VTI:  0.22 m                            Systemic Diam: 2.50 cm Cherlynn Kaiser MD Electronically signed by Cherlynn Kaiser MD Signature Date/Time: 01/28/2021/4:19:31 PM    Final (Updated)     Cardiac Studies   Cardiac catheterization January 2021: IMPRESSIONS  1. Left ventricular ejection fraction, by visual estimation, is 60 to  65%. The left ventricle has normal function. There is mildly increased  left ventricular hypertrophy.  2. The left ventricle has no regional wall motion abnormalities.  3. Global right ventricle has normal systolic function.The right  ventricular size is normal. No increase in right ventricular wall  thickness.  4. Left atrial size was normal.  5. Right atrial size was normal.  6. The mitral valve is normal in structure. Mild mitral valve  regurgitation. No evidence of mitral stenosis.  7. The tricuspid valve is normal in structure.  8. The tricuspid valve is normal in structure. Tricuspid valve  regurgitation is mild.  9. The aortic valve is tricuspid. Aortic valve regurgitation is mild.  Mild aortic valve stenosis.  10. Pulmonic regurgitation is mild.  11. The pulmonic valve was normal in structure. Pulmonic valve  regurgitation is mild.  12. Aortic dilatation noted.  13. There is mild dilatation of the aortic root measuring 40 mm.  14. Mildly elevated pulmonary artery systolic pressure.  15. The inferior vena cava is normal in size with greater than 50%  respiratory variability, suggesting right atrial pressure of 3 mmHg.   Cath 01/28/21:  RCA continuation 99% stenosis before the origin of 2 left ventricular branches that are relatively small.  TIMI grade II flow was noted.  Attempt to angioplasty the vessel was unsuccessful as we were unable to cross with a wire.  This resulted in total occlusion of the vessel with recurrence of more substantial symptoms and EKG changes.  Left main is widely patent.  LAD  stent  is widely patent and tortuous.  Proximal to the LAD stent there is native 30 to 40% narrowing.  Circumflex is widely patent and tortuous.  Inferobasal hypokinesis.  EF 60%.  LVEDP normal.  RECOMMENDATIONS:   IV hydration  IV nitroglycerin  Resume heparin for 48 hours  Aspirin and Plavix  Beta-blocker therapy  Analgesia with fentanyl transitioning to morphine.  TTE 12/16/19: 1. Left ventricular ejection fraction, by visual estimation, is 60 to  65%. The left ventricle has normal function. There is mildly increased  left ventricular hypertrophy.  2. The left ventricle has no regional wall motion abnormalities.  3. Global right ventricle has normal systolic function.The right  ventricular size is normal. No increase in right ventricular wall  thickness.  4. Left atrial size was normal.  5. Right atrial size was normal.  6. The mitral valve is normal in structure. Mild mitral valve  regurgitation. No evidence of mitral stenosis.  7. The tricuspid valve is normal in structure.  8. The tricuspid valve is normal in structure. Tricuspid valve  regurgitation is mild.  9. The aortic valve is tricuspid. Aortic valve regurgitation is mild.  Mild aortic valve stenosis.  10. Pulmonic regurgitation is mild.  11. The pulmonic valve was normal in structure. Pulmonic valve  regurgitation is mild.  12. Aortic dilatation noted.  13. There is mild dilatation of the aortic root measuring 40 mm.  14. Mildly elevated pulmonary artery systolic pressure.  15. The inferior vena cava is normal in size with greater than 50%  respiratory variability, suggesting right atrial pressure of 3 mmHg.    Patient Profile     69 y.o. male with diabetes mellitus type 2, essential hypertension, CAD with LAD stent 2011 after an abnormal nuclear stress test identified anterior wall ischemia presenting with inferior STEMI with distal RCA stenosis unable to cross with wire now planned for medical  management  Assessment & Plan    Inferior ST elevation MI  Known CAD s/p LAD PCI in 2011 Cath with 99% stenosis of distal PCA beyond PDA with tortuous segment. Unable to cross with the wire. Otherwise, 30-40% ostial-to-prox LAD, patent LAD stent, patent LCx. Now on medical management for distal RCA disease. -Continue ASA 81mg  daily -Continue coreg 6.25mg  BID -Continue plavix 75mg  daily -Continue coreg 25mg  BID -Continue heparin gtt for at least 48h for management of ACS -Continue crestor -Start imdur 30mg  daily -Okay to transfer out of the ICU  Diabetes mellitus type 2:  -Continue lantus and SSI while in hospital -Resume home meds on discharge  Hyperlipidemia:  -Continue crestor 40mg  daily -Continue fenofibrate  Essential hypertension: On losartan HCTZ, amlodipine, and Inderal LA (for blood pressure and tremor) at home. -Resume home amlodipine 5mg  daily -Continue coreg 25mg  BID -Start imdur as above  Somnolence: -Stop scheduled ativan  Tremors: -Resume home primidone   For questions or updates, please contact [facility] Please consult [email or web address] for contact info under        Signed, Freada Bergeron, MD  01/29/2021, 10:46 AM

## 2021-01-28 NOTE — Progress Notes (Signed)
Inpatient Diabetes Program Recommendations  AACE/ADA: New Consensus Statement on Inpatient Glycemic Control   Target Ranges:  Prepandial:   less than 140 mg/dL      Peak postprandial:   less than 180 mg/dL (1-2 hours)      Critically ill patients:  140 - 180 mg/dL   Results for RONSON, HAGINS (MRN [id number]) as of 01/28/2021 12:44  Ref. Range 01/28/2021 10:29  Glucose Latest Ref Range: 70 - 99 mg/dL 272 (H)  Hemoglobin A1C Latest Ref Range: 4.8 - 5.6 % 9.6 (H)   Review of Glycemic Control  Diabetes history: DM2 Outpatient Diabetes medications: Lantus 10 units QAM Current orders for Inpatient glycemic control: Novolog 0-15 units TID with meals  Inpatient Diabetes Program Recommendations:    Insulin: Please consider ordering Lantus 10 units daily to start 01/29/21 (since patient reports taking Lantus at home this morning).  HbgA1C: A1C 9.6% on 01/28/21 indicating an average glucose of 220 mg/dl over the past 2-3 months. Noted prior A1C was 11.5% on 11/16/20 and 13.7% on 06/10/20.   NOTE: Noted consult for diabetes coordinator for assistance with DM management. Diabetes coordinator working remotely today. Chart reviewed. Spoke with patient over the phone about diabetes and home regimen for diabetes control. Patient reports being followed by PCP for diabetes management and currently taking Lantus 10 units daily as an outpatient for diabetes control. Patient reports that he took Lantus 10 units at home this morning.  Inquired about Metformin noted on home medication list and patient states that his doctor took him off Metformin in the past.  Patient reports taking insulin consistently every morning.  Patient reports that he has not been checking glucose very often. Patient states that his wife has dementia and over the past 4-5 weeks he has been busy taking care of her and just not taken the time to check glucose. Patient reports that when he last checked his glucose it was 190-low 200's mg/dl.   Discussed A1C results (9.6% on 01/28/21) and explained that current A1C indicates an average glucose of 220 mg/dl over the past 2-3 months. Commended patient for improving A1C from 11.5% on 11/16/20 (prior to that 13.7% on 06/10/20) and encouraged him to continue to work with PCP to continue to improve.   Discussed glucose and A1C goals. Discussed importance of checking CBGs and maintaining good CBG control to prevent long-term and short-term complications. Explained how hyperglycemia leads to damage within blood vessels which lead to the common complications seen with uncontrolled diabetes. Stressed to the patient the importance of improving glycemic control to prevent further complications from uncontrolled diabetes. Inquired about potential of using continuous glucose monitoring sensor and patient states that his PCP has mentioned it but he was not sure if it would stay on due to the type of work he does.  Discussed FreeStyle Libre and explained how it could provide more data on glucose trends for him and his provider which could help with making medication changes to improve DM control. Encouraged patient to talk with his PCP again about prescribing so he could give it a try. Patient verbalized understanding of information discussed and reports no further questions at this time related to diabetes.   Thanks, Barnie Alderman, RN, MSN, CDE Diabetes Coordinator Inpatient Diabetes Program [phone] (Team Pager from 8am to 5pm)

## 2021-01-28 NOTE — H&P (Deleted)
  The note originally documented on this encounter has been moved the the encounter in which it belongs.  

## 2021-01-28 NOTE — H&P (Addendum)
Cardiology Admission History and Physical:   Patient ID: [name] [name] MRN: [id number]; DOB: [birth year]   Admission date: (Not on file)  PCP:  [name], MD   [place]  Cardiologist:  Fransico Him, MD  Advanced Practice Provider:  No care team member to display Electrophysiologist:  None        Chief Complaint: Chest pain  Patient Profile:   [name] [name] is a 70 y.o. male with diabetes mellitus type 2, essential hypertension, CAD with LAD stent 2011 after an abnormal nuclear stress test identified anterior wall ischemia.  No cardiac issues since that time.  Does not smoke cigarettes.  History of Present Illness:   [name] [name] 8 lasagna for breakfast and later developed "indigestion".  It persisted, became more severe, began to radiate into both arms.  He called EMS.  This was around 9 AM.  On the scene EKG was mildly abnormal and several nitroglycerin tablets were given that led to improvement in symptoms.  Upon arrival here at 10 AM his discomfort is 1/10.  States that if this was all he had he never would have called anyone.  This is a new discomfort that he has never had before.  Based upon the appearance of his EKG which reveals hyperacute ST-T abnormality in 2, 3, and aVF with reciprocal precordial ST T wave abnormality in V1 and V2, STEMI activation was felt to be appropriate.   Past Medical History:  Diagnosis Date  . Coronary artery disease    s/p PCI of LAD w Residual 50-60% RCA  . Diabetes ([place])   . Dilated aortic root (HCC)    75mm by echo 11/2019  . High cholesterol   . History of echocardiogram 06/2011   Normal LVF,mild LVH,trivial TR/MR, mild AS/AI  . History of kidney stones   . Hypertension   . Mild aortic stenosis    mild by echo 11/2019  . Tremor     Past Surgical History:  Procedure Laterality Date  . arm surgery Right   . stents - placement     x 2     Medications Prior to Admission: Prior to Admission  medications   Medication Sig Start Date End Date Taking? Authorizing Provider  allopurinol (ZYLOPRIM) 300 MG tablet Take 1 tablet (300 mg total) by mouth daily. 03/10/15   Sueanne Margarita, MD  amLODipine (NORVASC) 10 MG tablet Take 1 tablet (10 mg total) by mouth daily. 02/28/17   Sueanne Margarita, MD  aspirin 81 MG tablet Take 81 mg by mouth daily.    [provider]  benzonatate (TESSALON) 200 MG capsule benzonatate 200 mg capsule  TAKE 1 CAPSULE BY MOUTH THREE TIMES A DAY AS NEEDED FOR COUGH    [provider]  Blood Glucose Monitoring Suppl (FREESTYLE LITE) DEVI Please dispense continuous glucose monitoring system thank you 06/12/19   Caren Griffins, MD  buPROPion (WELLBUTRIN XL) 150 MG 24 hr tablet  06/10/20   [provider]  buPROPion (WELLBUTRIN XL) 300 MG 24 hr tablet Take 300 mg by mouth daily.    [provider]  carvedilol (COREG) 25 MG tablet  06/10/20   [provider]  Cholecalciferol (VITAMIN D) 125 MCG (5000 UT) CAPS Take 1 tablet by mouth daily.    [provider]  dexamethasone (DECADRON) 6 MG tablet dexamethasone 6 mg tablet  TAKE 1 TABLET (6 MG TOTAL) BY MOUTH DAILY.    [provider]  Evolocumab (  REPATHA SURECLICK) 431 MG/ML SOAJ Inject 1 pen into the skin every 14 (fourteen) days. 12/17/19   Sueanne Margarita, MD  ezetimibe (ZETIA) 10 MG tablet TAKE 1 TABLET BY MOUTH EVERY DAY 05/28/20   Sueanne Margarita, MD  fenofibrate (TRICOR) 145 MG tablet Take 1 tablet (145 mg total) by mouth daily. 02/19/20   Sueanne Margarita, MD  Insulin Glargine (LANTUS) 100 UNIT/ML Solostar Pen Inject 10 Units into the skin daily. 06/12/19   Caren Griffins, MD  ketorolac (ACULAR) 0.5 % ophthalmic solution  04/01/20   [provider]  losartan-hydrochlorothiazide (HYZAAR) 100-25 MG tablet Take 1 tablet daily by mouth. 09/26/17   Daune Perch, NP  meclizine (ANTIVERT) 25 MG tablet meclizine 25 mg tablet  TAKE 1 TABLET (25 MG TOTAL) BY  MOUTH 3 (THREE) TIMES DAILY AS NEEDED FOR DIZZINESS.    [provider]  metFORMIN (GLUCOPHAGE) 500 MG tablet Take 500 mg daily by mouth. 09/14/17   [provider]  moxifloxacin (VIGAMOX) 0.5 % ophthalmic solution  04/02/20   [provider]  nitroGLYCERIN (NITROSTAT) 0.4 MG SL tablet Place 1 tablet (0.4 mg total) under the tongue every 5 (five) minutes as needed for chest pain. 05/09/17   Sueanne Margarita, MD  nystatin (MYCOSTATIN) 100000 UNIT/ML suspension nystatin 100,000 unit/mL oral suspension  SWISH/GARGLE AND SWALLOW 1 TEASPOONFUL FOUR TIMES A DAY MOUTH/THROAT 30 DAY(S)    [provider]  pantoprazole (PROTONIX) 40 MG tablet Take 1 tablet by mouth daily. 03/04/19   [provider]  prednisoLONE acetate (PRED FORTE) 1 % ophthalmic suspension  04/01/20   [provider]  primidone (MYSOLINE) 50 MG tablet Take 1 tablet (50 mg total) by mouth 3 (three) times daily. Please call [phone] to schedule appt. 07/15/19   Marcial Pacas, MD  propranolol (INNOPRAN XL) 120 MG 24 hr capsule propranolol ER 120 mg capsule,24 hr,extended release  TAKE 1 CAPSULE BY MOUTH TWICE A DAY    [provider]  propranolol ER (INDERAL LA) 120 MG 24 hr capsule Take 2 capsules (240 mg total) by mouth daily. 07/04/18   Marcial Pacas, MD  rosuvastatin (CRESTOR) 20 MG tablet Take 1 tablet (20 mg total) by mouth daily. 11/28/19   Sueanne Margarita, MD     Allergies:    Allergies  Allergen Reactions  . Codeine Other (See Comments)    Vertigo and upset stomach    Social History:   Social History   Socioeconomic History  . Marital status: Married    Spouse name: Not on file  . Number of children: 2  . Years of education: 71  . Highest education level: High school graduate  Occupational History  . Occupation: own refrigeration business  Tobacco Use  . Smoking status: Never Smoker  . Smokeless tobacco: Never Used  Vaping Use  . Vaping Use: Never used  Substance  and Sexual Activity  . Alcohol use: Yes    Comment: socially  . Drug use: No  . Sexual activity: Not on file  Other Topics Concern  . Not on file  Social History Narrative   Lives at home with his wife.   Left-handed.   1 cup caffeine per day.   Social Determinants of Health   Financial Resource Strain: Not on file  Food Insecurity: Not on file  Transportation Needs: Not on file  Physical Activity: Not on file  Stress: Not on file  Social Connections: Not on file  Intimate Partner Violence: Not on  file    Family History: No significant data The patient's family history includes CAD in his brother and mother; Emphysema in his mother; Heart failure in his mother; Other in his father.    ROS:  Please see the history of present illness.  Under a lot of stress at home because his wife has dementia.  He has an essential tremor.  He is compliant with his medication regimen.  All other ROS reviewed and negative.     Physical Exam/Data:   Vitals:   01/28/21 1000 01/28/21 1015  SpO2:  99%  Weight: 107 kg   Height: 5\' 10"  (1.778 m)    No intake or output data in the 24 hours ending 01/28/21 1207 Last 3 Weights 01/28/2021 11/28/2019 08/01/2019  Weight (lbs) 236 lb 235 lb 235 lb  Weight (kg) 107.049 kg 106.595 kg 106.595 kg     Body mass index is 33.86 kg/m.  General:  Well nourished, well developed, in no acute distress lying at 45 degrees on the stretcher from the ambulance.  Not dyspneic. HEENT: normal Lymph: no adenopathy Neck: no JVD Endocrine:  No thryomegaly Vascular: No carotid bruits; FA pulses 2+ bilaterally without bruits  Cardiac:  normal S1, S2; RRR; with 1/6 to 2/6 systolic right upper sternal murmur. Lungs:  clear to auscultation bilaterally, no wheezing, rhonchi or rales  Abd: soft, nontender, no hepatomegaly  Ext: no edema Musculoskeletal:  No deformities, BUE and BLE strength normal and equal Skin: warm and dry  Neuro:  CNs 2-12 intact, no focal abnormalities  noted Psych:  Normal affect    EKG:  The ECG that was done in the field by EMS at 9:05 AM was personally reviewed and demonstrates 1 mm of ST elevation in 2, 3, aVF, and reciprocal ST-T abnormality in V1 and V2.  Relevant CV Studies:  Cardiac catheterization January 2021: IMPRESSIONS    1. Left ventricular ejection fraction, by visual estimation, is 60 to  65%. The left ventricle has normal function. There is mildly increased  left ventricular hypertrophy.  2. The left ventricle has no regional wall motion abnormalities.  3. Global right ventricle has normal systolic function.The right  ventricular size is normal. No increase in right ventricular wall  thickness.  4. Left atrial size was normal.  5. Right atrial size was normal.  6. The mitral valve is normal in structure. Mild mitral valve  regurgitation. No evidence of mitral stenosis.  7. The tricuspid valve is normal in structure.  8. The tricuspid valve is normal in structure. Tricuspid valve  regurgitation is mild.  9. The aortic valve is tricuspid. Aortic valve regurgitation is mild.  Mild aortic valve stenosis.  10. Pulmonic regurgitation is mild.  11. The pulmonic valve was normal in structure. Pulmonic valve  regurgitation is mild.  12. Aortic dilatation noted.  13. There is mild dilatation of the aortic root measuring 40 mm.  14. Mildly elevated pulmonary artery systolic pressure.  15. The inferior vena cava is normal in size with greater than 50%  respiratory variability, suggesting right atrial pressure of 3 mmHg.   Laboratory Data:  High Sensitivity Troponin:   Recent Labs  Lab 01/28/21 1029  TROPONINIHS 101*      Chemistry Recent Labs  Lab 01/28/21 1029  NA 134*  K 3.9  CL 100  CO2 24  GLUCOSE 272*  BUN 10  CREATININE 1.04  CALCIUM 9.4  GFRNONAA >60  ANIONGAP 10    Recent Labs  Lab 01/28/21 1029  PROT 6.3*  ALBUMIN 3.7  AST 25  ALT 19  ALKPHOS 48  BILITOT 1.0    Hematology Recent Labs  Lab 01/28/21 1029  WBC 8.1  RBC 4.98  HGB 15.6  HCT 43.1  MCV 86.5  MCH 31.3  MCHC 36.2*  RDW 12.9  PLT 143*   BNPNo results for input(s): BNP, PROBNP in the last 168 hours.  DDimer No results for input(s): DDIMER in the last 168 hours.   Radiology/Studies:  CARDIAC CATHETERIZATION  Result Date: 01/28/2021  RCA continuation 99% stenosis before the origin of 2 left ventricular branches that are relatively small.  TIMI grade II flow was noted.  Attempt to angioplasty the vessel was unsuccessful as we were unable to cross with a wire.  This resulted in total occlusion of the vessel with recurrence of more substantial symptoms and EKG changes.  Left main is widely patent.  LAD stent is widely patent and tortuous.  Proximal to the LAD stent there is native 30 to 40% narrowing.  Circumflex is widely patent and tortuous.  Inferobasal hypokinesis.  EF 60%.  LVEDP normal. RECOMMENDATIONS:  IV hydration  IV nitroglycerin  Resume heparin for 48 hours  Aspirin and Plavix  Beta-blocker therapy  Analgesia with fentanyl transitioning to morphine.     Assessment and Plan:   1. Inferior ST elevation MI with borderline diagnostic EKG.  Upon arrival chest pain was markedly reduced.  Symptoms and onset however is compatible with acute coronary syndrome and myocardial infarction.  Discussed the situation with the patient and decided to perform diagnostic catheterization and possible PCI if indicated and technically possible.  Patient has had prior cath and PCI.  He understands the procedural approach and risk of catheterization. 2. Diabetes mellitus type 2: Current status not known.  Takes Lantus insulin and Metformin. 3. Hyperlipidemia: On moderate intensity statin therapy. 4. Essential hypertension: On losartan HCTZ, amlodipine, and Inderal LA (for blood pressure and tremor)  RECOMMENDATIONS:  STEMI activation.  Catheterization and PCI if  indicated.  Laboratory data will be drawn in the Cath Lab  Further management will be based upon findings.  Risk Assessment/Risk Scores:     TIMI Risk Score for ST  Elevation MI:   The patient's TIMI risk score is 3, which indicates a 4.4% risk of all cause mortality at 30 days. {  Severity of Illness: The appropriate patient status for this patient is INPATIENT. Inpatient status is judged to be reasonable and necessary in order to provide the required intensity of service to ensure the patient's safety. The patient's presenting symptoms, physical exam findings, and initial radiographic and laboratory data in the context of their chronic comorbidities is felt to place them at high risk for further clinical deterioration. Furthermore, it is not anticipated that the patient will be medically stable for discharge from the hospital within 2 midnights of admission. The following factors support the patient status of inpatient.   " The patient's presenting symptoms include chest pain. " The worrisome physical exam findings include acute MI on EKG. " The initial radiographic and laboratory data are worrisome because of labs not available. " The chronic co-morbidities include diabetes mellitus.   * I certify that at the point of admission it is my clinical judgment that the patient will require inpatient hospital care spanning beyond 2 midnights from the point of admission due to high intensity of service, high risk for further deterioration and high frequency of surveillance required.*    For questions or updates,  please contact [place] Please consult [email or web address] for contact info under   CRITICAL CARE TIME: 35 minutes   Signed, Sinclair Grooms, MD  01/28/2021 12:07 PM

## 2021-01-28 NOTE — Progress Notes (Signed)
  Echocardiogram 2D Echocardiogram has been performed.  Michiel Cowboy 01/28/2021, 2:47 PM

## 2021-01-28 NOTE — Progress Notes (Signed)
ANTICOAGULATION CONSULT NOTE - Initial Consult  Pharmacy Consult for IV heparin Indication: chest pain/ACS  Allergies  Allergen Reactions  . Codeine Other (See Comments)    Vertigo and upset stomach    Patient Measurements:   Heparin Dosing Weight: 96kg  Vital Signs: BP: 180/103 (03/11 1130) Pulse Rate: 66 (03/11 1130)  Labs: Recent Labs    01/28/21 1029  HGB 15.6  HCT 43.1  PLT 143*  APTT 25  LABPROT 13.3  INR 1.1  CREATININE 1.04  TROPONINIHS 101*    Estimated Creatinine Clearance: 82.1 mL/min (by C-G formula based on SCr of 1.04 mg/dL).   Medical History: Past Medical History:  Diagnosis Date  . Coronary artery disease    s/p PCI of LAD w Residual 50-60% RCA  . Diabetes ([place])   . Dilated aortic root (HCC)    52mm by echo 11/2019  . High cholesterol   . History of echocardiogram 06/2011   Normal LVF,mild LVH,trivial TR/MR, mild AS/AI  . History of kidney stones   . Hypertension   . Mild aortic stenosis    mild by echo 11/2019  . Tremor     Medications:  Infusions:  . sodium chloride 125 mL/hr at 01/28/21 1309  . sodium chloride    . nitroGLYCERIN 60 mcg/min (01/28/21 1159)    Assessment: [name] presenting with chest pain radiating to both arms and was found to have STEMI. PMH is s/f CAD s/p PCI to LAD approximately 10 years ago and he remains on aspirin. He had a cath which revealed 99% stenosis of his distal RCA with TIMI grade II flow, and this was unable to be opened. Plan is for medical management with aspirin and plavix, and pharmacy has been consulted to dose IV heparin starting 8 hours after sheath removal (~11:30am).   Goal of Therapy:  Heparin level 0.3-0.7 units/ml Monitor platelets by anticoagulation protocol: Yes   Plan:  Start heparin infusion at 1,150 units/hr at 2000 Follow-up with 6-hr heparin level Follow daily heparin level and CBC, monitor s/sx bleeding  [name] [name], PharmD PGY1 Acute Care Pharmacy Resident Please refer  to [facility] for unit-specific pharmacist

## 2021-01-28 NOTE — CV Procedure (Signed)
   Distal RCA beyond the origin of the PDA within a tortuous segment 99% stenosis with TIMI grade II flow.  RCA.  Attempted to cross with a wire and perform angioplasty but poor wire torque ability and distal location prevented crossing.  Left main is widely patent  30 to 40% ostial to proximal LAD.  Widely patent LAD with into tortuous segment proximal to mid.  Tortuous circumflex, widely patent  Inferobasal hypokinesis.  EF 50%.  RECOMMENDATIONS: Very distal RCA before the second and third left ventricular branch of the distal RCA. Tortuosity and distal location prevented crossing with the wire.  We will be forced to manage medically.  Will likely have a small inferolateral infarction.  Plan aspirin, Plavix, and IV heparin.

## 2021-01-28 NOTE — Plan of Care (Signed)

## 2021-01-29 DIAGNOSIS — I2119 ST elevation (STEMI) myocardial infarction involving other coronary artery of inferior wall: Secondary | ICD-10-CM | POA: Diagnosis not present

## 2021-01-29 LAB — BASIC METABOLIC PANEL
Anion gap: 10 (ref 5–15)
BUN: 7 mg/dL — ABNORMAL LOW (ref 8–23)
CO2: 25 mmol/L (ref 22–32)
Calcium: 9.5 mg/dL (ref 8.9–10.3)
Chloride: 101 mmol/L (ref 98–111)
Creatinine, Ser: 1.03 mg/dL (ref 0.61–1.24)
GFR, Estimated: >60 mL/min (ref >60)
Glucose, Bld: 242 mg/dL — ABNORMAL HIGH (ref 70–99)
Potassium: 3.6 mmol/L (ref 3.5–5.1)
Sodium: 136 mmol/L (ref 135–145)

## 2021-01-29 LAB — GLUCOSE, CAPILLARY
Glucose-Capillary: 216 mg/dL — ABNORMAL HIGH (ref 70–99)
Glucose-Capillary: 231 mg/dL — ABNORMAL HIGH (ref 70–99)
Glucose-Capillary: 192 mg/dL — ABNORMAL HIGH (ref 70–99)
Glucose-Capillary: 168 mg/dL — ABNORMAL HIGH (ref 70–99)

## 2021-01-29 LAB — CBC
HCT: 41.7 % (ref 39.0–52.0)
Hemoglobin: 15.1 g/dL (ref 13.0–17.0)
MCH: 31.4 pg (ref 26.0–34.0)
MCHC: 36.2 g/dL — ABNORMAL HIGH (ref 30.0–36.0)
MCV: 86.7 fL (ref 80.0–100.0)
Platelets: 145 10*3/uL — ABNORMAL LOW (ref 150–400)
RBC: 4.81 MIL/uL (ref 4.22–5.81)
RDW: 12.9 % (ref 11.5–15.5)
WBC: 10.4 10*3/uL (ref 4.0–10.5)
nRBC: 0 % (ref 0.0–0.2)

## 2021-01-29 LAB — MRSA PCR SCREENING: MRSA by PCR: NEGATIVE

## 2021-01-29 LAB — HEPARIN LEVEL (UNFRACTIONATED)
Heparin Unfractionated: 0.12 IU/mL — ABNORMAL LOW (ref 0.30–0.70)
Heparin Unfractionated: 0.17 IU/mL — ABNORMAL LOW (ref 0.30–0.70)
Heparin Unfractionated: <0.1 IU/mL — ABNORMAL LOW (ref 0.30–0.70)

## 2021-01-29 MED ORDER — CHLORHEXIDINE GLUCONATE CLOTH 2 % EX PADS
6.0000 | MEDICATED_PAD | Freq: Every day | CUTANEOUS | Status: DC
  Administered 2021-01-29 – 2021-01-30 (×2): 6 via TOPICAL

## 2021-01-29 MED ORDER — ISOSORBIDE MONONITRATE ER 30 MG PO TB24
30.0000 mg | ORAL_TABLET | Freq: Every day | ORAL | Status: DC
  Administered 2021-01-29 – 2021-01-31 (×3): 30 mg via ORAL
  Filled 2021-01-29 (×3): qty 1

## 2021-01-29 MED ORDER — AMLODIPINE BESYLATE 10 MG PO TABS
10.0000 mg | ORAL_TABLET | Freq: Every day | ORAL | Status: DC
  Administered 2021-01-29 – 2021-01-31 (×3): 10 mg via ORAL
  Filled 2021-01-29 (×3): qty 1

## 2021-01-29 MED ORDER — PRIMIDONE 50 MG PO TABS
50.0000 mg | ORAL_TABLET | Freq: Every day | ORAL | Status: DC
  Administered 2021-01-29 – 2021-01-30 (×2): 50 mg via ORAL
  Filled 2021-01-29 (×3): qty 1

## 2021-01-29 MED ORDER — PRIMIDONE 50 MG PO TABS
100.0000 mg | ORAL_TABLET | Freq: Every morning | ORAL | Status: DC
  Administered 2021-01-30: 100 mg via ORAL
  Filled 2021-01-29 (×2): qty 2

## 2021-01-29 MED ORDER — POTASSIUM CHLORIDE CRYS ER 20 MEQ PO TBCR
40.0000 meq | EXTENDED_RELEASE_TABLET | Freq: Once | ORAL | Status: AC
  Administered 2021-01-29: 40 meq via ORAL
  Filled 2021-01-29: qty 2

## 2021-01-29 NOTE — Progress Notes (Signed)
ANTICOAGULATION CONSULT NOTE - Initial Consult  Pharmacy Consult for IV heparin Indication: chest pain/ACS  Allergies  Allergen Reactions  . Codeine Other (See Comments)    Vertigo and upset stomach    Patient Measurements: Weight: 93.4 kg (205 lb 14.6 oz) Heparin Dosing Weight: 96kg  Vital Signs: Temp: 98.8 F (37.1 C) (03/12 0800) Temp Source: Oral (03/12 0513) BP: 168/97 (03/12 1100)  Labs: Recent Labs    01/28/21 1029 01/28/21 1356 01/28/21 1608 01/29/21 0211 01/29/21 0957  HGB 14.6  15.6  --   --  15.1  --   HCT 43.0  43.1  --   --  41.7  --   PLT 143*  --   --  145*  --   APTT 25  --   --   --   --   LABPROT 13.3  --   --   --   --   INR 1.1  --   --   --   --   HEPARINUNFRC  --   --   --  0.12* 0.17*  CREATININE 0.90  1.04  --   --  1.03  --   TROPONINIHS 101* 254* 462*  --   --     Estimated Creatinine Clearance: 77.7 mL/min (by C-G formula based on SCr of 1.03 mg/dL).   Medical History: Past Medical History:  Diagnosis Date  . Coronary artery disease    s/p PCI of LAD w Residual 50-60% RCA  . Diabetes ([place])   . Dilated aortic root (HCC)    73mm by echo 11/2019  . High cholesterol   . History of echocardiogram 06/2011   Normal LVF,mild LVH,trivial TR/MR, mild AS/AI  . History of kidney stones   . Hypertension   . Mild aortic stenosis    mild by echo 11/2019  . Tremor     Medications:  Infusions:  . sodium chloride    . heparin 1,300 Units/hr (01/29/21 0800)    Assessment: [name] presenting with chest pain radiating to both arms and was found to have STEMI. PMH is s/f CAD s/p PCI to LAD approximately 10 years ago and he remains on aspirin. He had a cath which revealed 99% stenosis of his distal RCA with TIMI grade II flow, and this was unable to be opened. Plan is for medical management with aspirin and plavix and heparin.  Heparin level remains subtherapeutic but 0.12 > 0.17 after increasing drip rate to 1300 units/hr. Hgb wnl at 15, pltc  140s stable. No overt bleeding or infusion issues per discussion with nursing.  Goal of Therapy:  Heparin level 0.3-0.7 units/ml Monitor platelets by anticoagulation protocol: Yes   Plan:  Increase heparin infusion to 1500 units/hr Check 6-hr HL Monitor daily HL, CBC, s/sx bleeding  Richardine Service, PharmD, BCPS PGY2 Cardiology Pharmacy Resident Phone: [phone] 01/29/2021  11:16 AM  Please check [email or web address] for unit-specific pharmacy phone numbers.

## 2021-01-29 NOTE — Progress Notes (Signed)
[place] for IV heparin Indication: chest pain/ACS  Allergies  Allergen Reactions  . Codeine Other (See Comments)    Vertigo and upset stomach    Patient Measurements: Weight: 93.4 kg (205 lb 14.6 oz) Heparin Dosing Weight: 96kg  Vital Signs: Temp: 98.4 F (36.9 C) (03/12 1500) BP: 127/73 (03/12 1921)  Labs: Recent Labs    01/28/21 1029 01/28/21 1356 01/28/21 1608 01/29/21 0211 01/29/21 0957 01/29/21 1738  HGB 14.6  15.6  --   --  15.1  --   --   HCT 43.0  43.1  --   --  41.7  --   --   PLT 143*  --   --  145*  --   --   APTT 25  --   --   --   --   --   LABPROT 13.3  --   --   --   --   --   INR 1.1  --   --   --   --   --   HEPARINUNFRC  --   --   --  0.12* 0.17* <0.10*  CREATININE 0.90  1.04  --   --  1.03  --   --   TROPONINIHS 101* 254* 462*  --   --   --     Estimated Creatinine Clearance: 77.7 mL/min (by C-G formula based on SCr of 1.03 mg/dL).   Medical History: Past Medical History:  Diagnosis Date  . Coronary artery disease    s/p PCI of LAD w Residual 50-60% RCA  . Diabetes ([place])   . Dilated aortic root (HCC)    83mm by echo 11/2019  . High cholesterol   . History of echocardiogram 06/2011   Normal LVF,mild LVH,trivial TR/MR, mild AS/AI  . History of kidney stones   . Hypertension   . Mild aortic stenosis    mild by echo 11/2019  . Tremor     Medications:  Infusions:  . sodium chloride    . heparin 1,500 Units/hr (01/29/21 1900)    Assessment: [name] presenting with chest pain radiating to both arms and was found to have STEMI. PMH is s/f CAD s/p PCI to LAD approximately 10 years ago and he remains on aspirin. He had a cath which revealed 99% stenosis of his distal RCA with TIMI grade II flow, and this was unable to be opened. Plan is for medical management with aspirin and plavix and heparin.  Heparin undetectable, no infusion issues per nursing, no S/Sx bleeding.  Goal of Therapy:  Heparin  level 0.3-0.7 units/ml Monitor platelets by anticoagulation protocol: Yes   Plan:  Increase heparin to 1800 units/h Recheck heparin level with daily labs   Arrie Senate, PharmD, BCPS, [facility] Clinical Pharmacist [phone] Please check AMION for all [facility] Pharmacy numbers 01/29/2021

## 2021-01-29 NOTE — Progress Notes (Signed)
[place] for  heparin Indication: chest pain/ACS  Allergies  Allergen Reactions  . Codeine Other (See Comments)    Vertigo and upset stomach    Patient Measurements:   Heparin Dosing Weight: 96kg  Vital Signs: Temp: 98.2 F (36.8 C) (03/11 1946) Temp Source: Oral (03/11 1946) BP: 150/91 (03/12 0215) Pulse Rate: 72 (03/11 1945)  Labs: Recent Labs    01/28/21 1029 01/28/21 1356 01/28/21 1608 01/29/21 0211  HGB 14.6  15.6  --   --  15.1  HCT 43.0  43.1  --   --  41.7  PLT 143*  --   --  145*  APTT 25  --   --   --   LABPROT 13.3  --   --   --   INR 1.1  --   --   --   HEPARINUNFRC  --   --   --  0.12*  CREATININE 0.90  1.04  --   --  1.03  TROPONINIHS 101* 254* 462*  --     Estimated Creatinine Clearance: 82.9 mL/min (by C-G formula based on SCr of 1.03 mg/dL).   Assessment: 70 y.o. male with STEMI s/p cath for heparin  Goal of Therapy:  Heparin level 0.3-0.7 units/ml Monitor platelets by anticoagulation protocol: Yes   Plan:  Increase Heparin 1300 units/hr Check heparin level in 8 hours.   Phillis Knack, PharmD, BCPS

## 2021-01-29 NOTE — Progress Notes (Signed)
In to see patient to begin MI education, but he was too sleepy to keep his eyes open.  I left an MI booklet at his bedside. Not appropriate for education at this time.

## 2021-01-30 ENCOUNTER — Other Ambulatory Visit: Payer: Self-pay

## 2021-01-30 DIAGNOSIS — I2119 ST elevation (STEMI) myocardial infarction involving other coronary artery of inferior wall: Secondary | ICD-10-CM | POA: Diagnosis not present

## 2021-01-30 LAB — CBC
HCT: 41.4 % (ref 39.0–52.0)
Hemoglobin: 14.6 g/dL (ref 13.0–17.0)
MCH: 30.9 pg (ref 26.0–34.0)
MCHC: 35.3 g/dL (ref 30.0–36.0)
MCV: 87.7 fL (ref 80.0–100.0)
Platelets: 136 10*3/uL — ABNORMAL LOW (ref 150–400)
RBC: 4.72 MIL/uL (ref 4.22–5.81)
RDW: 12.8 % (ref 11.5–15.5)
WBC: 10.8 10*3/uL — ABNORMAL HIGH (ref 4.0–10.5)
nRBC: 0 % (ref 0.0–0.2)

## 2021-01-30 LAB — BASIC METABOLIC PANEL
Anion gap: 10 (ref 5–15)
BUN: 6 mg/dL — ABNORMAL LOW (ref 8–23)
CO2: 22 mmol/L (ref 22–32)
Calcium: 9.1 mg/dL (ref 8.9–10.3)
Chloride: 101 mmol/L (ref 98–111)
Creatinine, Ser: 0.82 mg/dL (ref 0.61–1.24)
GFR, Estimated: >60 mL/min (ref >60)
Glucose, Bld: 212 mg/dL — ABNORMAL HIGH (ref 70–99)
Potassium: 3.5 mmol/L (ref 3.5–5.1)
Sodium: 133 mmol/L — ABNORMAL LOW (ref 135–145)

## 2021-01-30 LAB — GLUCOSE, CAPILLARY
Glucose-Capillary: 203 mg/dL — ABNORMAL HIGH (ref 70–99)
Glucose-Capillary: 222 mg/dL — ABNORMAL HIGH (ref 70–99)
Glucose-Capillary: 217 mg/dL — ABNORMAL HIGH (ref 70–99)
Glucose-Capillary: 162 mg/dL — ABNORMAL HIGH (ref 70–99)
Glucose-Capillary: 161 mg/dL — ABNORMAL HIGH (ref 70–99)

## 2021-01-30 LAB — HEPARIN LEVEL (UNFRACTIONATED)
Heparin Unfractionated: 0.33 IU/mL (ref 0.30–0.70)
Heparin Unfractionated: 0.37 IU/mL (ref 0.30–0.70)

## 2021-01-30 MED ORDER — POTASSIUM CHLORIDE CRYS ER 20 MEQ PO TBCR
40.0000 meq | EXTENDED_RELEASE_TABLET | Freq: Once | ORAL | Status: AC
  Administered 2021-01-30: 40 meq via ORAL
  Filled 2021-01-30: qty 2

## 2021-01-30 NOTE — Progress Notes (Signed)
Progress Note  Patient Name: [name] [name] Date of Encounter: 01/30/2021  [facility] Cardiologist: Fransico Him, MD   Subjective   Tired this morning but arousable to voice. No chest pain. Noted to be unsteady with ambulation by nursing staff.  Inpatient Medications    Scheduled Meds: . amLODipine  10 mg Oral Daily  . aspirin EC  81 mg Oral Daily  . buPROPion  300 mg Oral Daily  . carvedilol  6.25 mg Oral BID WC  . Chlorhexidine Gluconate Cloth  6 each Topical Daily  . clopidogrel  75 mg Oral Q breakfast  . fenofibrate  54 mg Oral Daily  . insulin aspart  0-15 Units Subcutaneous TID WC  . insulin glargine  10 Units Subcutaneous Daily  . isosorbide mononitrate  30 mg Oral Daily  . primidone  100 mg Oral q AM  . primidone  50 mg Oral QHS  . rosuvastatin  40 mg Oral Daily  . sodium chloride flush  3 mL Intravenous Q12H   Continuous Infusions: . sodium chloride    . heparin 1,800 Units/hr (01/30/21 0728)   PRN Meds: sodium chloride, acetaminophen, fentaNYL (SUBLIMAZE) injection, ondansetron (ZOFRAN) IV, oxyCODONE, sodium chloride flush   Vital Signs    Vitals:   01/30/21 0400 01/30/21 0500 01/30/21 0600 01/30/21 0700  BP: 135/76 (!) 143/78 125/74 127/82  Pulse:      Resp: (!) 21 (!) 21 (!) 21 (!) 21  Temp: 98.6 F (37 C)     TempSrc: Oral     SpO2: 93% 94% 94% 94%  Weight:  92.7 kg    Height:        Intake/Output Summary (Last 24 hours) at 01/30/2021 0741 Last data filed at 01/30/2021 0700 Gross per 24 hour  Intake 873.57 ml  Output 2075 ml  Net -1201.43 ml   Last 3 Weights 01/30/2021 01/29/2021 01/28/2021  Weight (lbs) 204 lb 5.9 oz 205 lb 14.6 oz 236 lb  Weight (kg) 92.7 kg 93.4 kg 107.049 kg      Telemetry    NSR - Personally Reviewed  ECG    No new tracing - Personally Reviewed  Physical Exam   GEN: Tired, but arousable to voice  Neck: No JVD Cardiac: RRR, soft 2/6 systolic murmur, no rubs or gallops Respiratory: Clear to auscultation  bilaterally. GI: Soft, nontender, non-distended  MS: No edema; No deformity. Neuro:  Nonfocal  Psych: Normal affect   Labs    High Sensitivity Troponin:   Recent Labs  Lab 01/28/21 1029 01/28/21 1356 01/28/21 1608  TROPONINIHS 101* 254* 462*      Chemistry Recent Labs  Lab 01/28/21 1029 01/29/21 0211 01/30/21 0156  NA 137  134* 136 133*  K 4.0  3.9 3.6 3.5  CL 101  100 101 101  CO2 24 25 22   GLUCOSE 281*  272* 242* 212*  BUN 13  10 7* 6*  CREATININE 0.90  1.04 1.03 0.82  CALCIUM 9.4 9.5 9.1  PROT 6.3*  --   --   ALBUMIN 3.7  --   --   AST 25  --   --   ALT 19  --   --   ALKPHOS 48  --   --   BILITOT 1.0  --   --   GFRNONAA >60 >60 >60  ANIONGAP 10 10 10      Hematology Recent Labs  Lab 01/28/21 1029 01/29/21 0211 01/30/21 0156  WBC 8.1 10.4 10.8*  RBC 4.98 4.81  4.72  HGB 14.6  15.6 15.1 14.6  HCT 43.0  43.1 41.7 41.4  MCV 86.5 86.7 87.7  MCH 31.3 31.4 30.9  MCHC 36.2* 36.2* 35.3  RDW 12.9 12.9 12.8  PLT 143* 145* 136*    BNP Recent Labs  Lab 01/28/21 1713  BNP 200.8*     DDimer No results for input(s): DDIMER in the last 168 hours.   Radiology    CARDIAC CATHETERIZATION  Result Date: 01/28/2021  RCA continuation 99% stenosis before the origin of 2 left ventricular branches that are relatively small.  TIMI grade II flow was noted.  Attempt to angioplasty the vessel was unsuccessful as we were unable to cross with a wire.  This resulted in total occlusion of the vessel with recurrence of more substantial symptoms and EKG changes.  Left main is widely patent.  LAD stent is widely patent and tortuous.  Proximal to the LAD stent there is native 30 to 40% narrowing.  Circumflex is widely patent and tortuous.  Inferobasal hypokinesis.  EF 60%.  LVEDP normal. RECOMMENDATIONS:  IV hydration  IV nitroglycerin  Resume heparin for 48 hours  Aspirin and Plavix  Beta-blocker therapy  Analgesia with fentanyl transitioning to morphine.    ECHOCARDIOGRAM COMPLETE  Result Date: 01/28/2021    ECHOCARDIOGRAM REPORT   Patient Name:   [name] [name] Date of Exam: 01/28/2021 Medical Rec #:  [id number]     Height:       70.0 in Accession #:    [id number]    Weight:       236.0 lb Date of Birth:  [birth year]     BSA:          2.239 m Patient Age:    70 years      BP:           180/103 mmHg Patient Gender: M             HR:           69 bpm. Exam Location:  Inpatient Procedure: 2D Echo, Cardiac Doppler and Color Doppler                               MODIFIED REPORT: This report was modified by Cherlynn Kaiser MD on 01/28/2021 due to revision.  Indications:     Chest Pain R07.9  History:         Patient has prior history of Echocardiogram examinations, most                  recent 12/16/2019. CAD; Risk Factors:Hypertension, Dyslipidemia,                  Diabetes and Non-Smoker. Dilated aortic root. Mild AS.  Sonographer:     Vickie Epley RDCS Referring Phys:  [id number] Darreld Mclean Diagnosing Phys: Cherlynn Kaiser MD IMPRESSIONS  1. Left ventricular ejection fraction, by estimation, is 60 to 65%. The left ventricle has normal function. The left ventricle demonstrates regional wall motion abnormalities (see scoring diagram/findings for description). There is mild left ventricular  hypertrophy. Left ventricular diastolic parameters are consistent with Grade I diastolic dysfunction (impaired relaxation).  2. Right ventricular systolic function is normal. The right ventricular size is normal. There is normal pulmonary artery systolic pressure.  3. The mitral valve is normal in structure. Trivial mitral valve regurgitation. No evidence of mitral stenosis.  4. The aortic valve is abnormal. There is moderate  calcification of the aortic valve. Aortic valve regurgitation is trivial. Mild aortic valve stenosis. Aortic valve mean gradient measures 11.0 mmHg.  5. Aortic dilatation noted. There is mild dilatation of the ascending aorta, measuring 41 mm.  6. The inferior  vena cava is normal in size with greater than 50% respiratory variability, suggesting right atrial pressure of 3 mmHg. FINDINGS  Left Ventricle: Left ventricular ejection fraction, by estimation, is 60 to 65%. The left ventricle has normal function. The left ventricle demonstrates regional wall motion abnormalities. The left ventricular internal cavity size was normal in size. There is mild left ventricular hypertrophy. Left ventricular diastolic parameters are consistent with Grade I diastolic dysfunction (impaired relaxation).  LV Wall Scoring: The basal inferolateral segment and basal inferior segment are hypokinetic. Right Ventricle: The right ventricular size is normal. No increase in right ventricular wall thickness. Right ventricular systolic function is normal. There is normal pulmonary artery systolic pressure. The tricuspid regurgitant velocity is 1.94 m/s, and  with an assumed right atrial pressure of 3 mmHg, the estimated right ventricular systolic pressure is 50.2 mmHg. Left Atrium: Left atrial size was normal in size. Right Atrium: Right atrial size was normal in size. Pericardium: There is no evidence of pericardial effusion. Mitral Valve: The mitral valve is normal in structure. Trivial mitral valve regurgitation. No evidence of mitral valve stenosis. Tricuspid Valve: The tricuspid valve is normal in structure. Tricuspid valve regurgitation is not demonstrated. No evidence of tricuspid stenosis. Aortic Valve: The aortic valve is abnormal. There is moderate calcification of the aortic valve. Aortic valve regurgitation is trivial. Aortic regurgitation PHT measures 701 msec. Mild aortic stenosis is present. Aortic valve mean gradient measures 11.0 mmHg. Aortic valve peak gradient measures 19.2 mmHg. Aortic valve area, by VTI measures 2.44 cm. Pulmonic Valve: The pulmonic valve was grossly normal. Pulmonic valve regurgitation is trivial. No evidence of pulmonic stenosis. Aorta: Aortic dilatation noted.  There is mild dilatation of the ascending aorta, measuring 41 mm. Venous: The inferior vena cava is normal in size with greater than 50% respiratory variability, suggesting right atrial pressure of 3 mmHg. IAS/Shunts: No atrial level shunt detected by color flow Doppler.  LEFT VENTRICLE PLAX 2D LVIDd:         4.20 cm  Diastology LVIDs:         3.20 cm  LV e' medial:    3.65 cm/s LV PW:         1.10 cm  LV E/e' medial:  16.0 LV IVS:        1.10 cm  LV e' lateral:   6.08 cm/s LVOT diam:     2.50 cm  LV E/e' lateral: 9.6 LV SV:         108 LV SV Index:   48 LVOT Area:     4.91 cm  RIGHT VENTRICLE RV S prime:     12.10 cm/s TAPSE (M-mode): 2.2 cm LEFT ATRIUM             Index       RIGHT ATRIUM           Index LA diam:        3.90 cm 1.74 cm/m  RA Area:     15.10 cm LA Vol (A2C):   34.4 ml 15.36 ml/m RA Volume:   36.30 ml  16.21 ml/m LA Vol (A4C):   37.5 ml 16.75 ml/m LA Biplane Vol: 38.1 ml 17.01 ml/m  AORTIC VALVE AV Area (Vmax):    2.53  cm AV Area (Vmean):   2.46 cm AV Area (VTI):     2.44 cm AV Vmax:           219.00 cm/s AV Vmean:          156.000 cm/s AV VTI:            0.440 m AV Peak Grad:      19.2 mmHg AV Mean Grad:      11.0 mmHg LVOT Vmax:         113.00 cm/s LVOT Vmean:        78.300 cm/s LVOT VTI:          0.219 m LVOT/AV VTI ratio: 0.50 AI PHT:            701 msec  AORTA Ao Root diam: 3.40 cm Ao Asc diam:  4.10 cm MITRAL VALVE               TRICUSPID VALVE MV Area (PHT): 3.31 cm    TR Peak grad:   15.1 mmHg MV Decel Time: 229 msec    TR Vmax:        194.00 cm/s MV E velocity: 58.30 cm/s MV A velocity: 91.90 cm/s  SHUNTS MV E/A ratio:  0.63        Systemic VTI:  0.22 m                            Systemic Diam: 2.50 cm Cherlynn Kaiser MD Electronically signed by Cherlynn Kaiser MD Signature Date/Time: 01/28/2021/4:19:31 PM    Final (Updated)     Cardiac Studies   Cardiac catheterization January 2021: IMPRESSIONS  1. Left ventricular ejection fraction, by visual estimation, is 60 to  65%.  The left ventricle has normal function. There is mildly increased  left ventricular hypertrophy.  2. The left ventricle has no regional wall motion abnormalities.  3. Global right ventricle has normal systolic function.The right  ventricular size is normal. No increase in right ventricular wall  thickness.  4. Left atrial size was normal.  5. Right atrial size was normal.  6. The mitral valve is normal in structure. Mild mitral valve  regurgitation. No evidence of mitral stenosis.  7. The tricuspid valve is normal in structure.  8. The tricuspid valve is normal in structure. Tricuspid valve  regurgitation is mild.  9. The aortic valve is tricuspid. Aortic valve regurgitation is mild.  Mild aortic valve stenosis.  10. Pulmonic regurgitation is mild.  11. The pulmonic valve was normal in structure. Pulmonic valve  regurgitation is mild.  12. Aortic dilatation noted.  13. There is mild dilatation of the aortic root measuring 40 mm.  14. Mildly elevated pulmonary artery systolic pressure.  15. The inferior vena cava is normal in size with greater than 50%  respiratory variability, suggesting right atrial pressure of 3 mmHg.   Cath 01/28/21:  RCA continuation 99% stenosis before the origin of 2 left ventricular branches that are relatively small. TIMI grade II flow was noted.  Attempt to angioplasty the vessel was unsuccessful as we were unable to cross with a wire. This resulted in total occlusion of the vessel with recurrence of more substantial symptoms and EKG changes.  Left main is widely patent.  LAD stent is widely patent and tortuous. Proximal to the LAD stent there is native 30 to 40% narrowing.  Circumflex is widely patent and tortuous.  Inferobasal hypokinesis. EF 60%. LVEDP normal.  RECOMMENDATIONS:   IV hydration  IV nitroglycerin  Resume heparin for 48 hours  Aspirin and Plavix  Beta-blocker therapy  Analgesia with fentanyl transitioning to  morphine.  TTE 12/16/19: 1. Left ventricular ejection fraction, by visual estimation, is 60 to  65%. The left ventricle has normal function. There is mildly increased  left ventricular hypertrophy.  2. The left ventricle has no regional wall motion abnormalities.  3. Global right ventricle has normal systolic function.The right  ventricular size is normal. No increase in right ventricular wall  thickness.  4. Left atrial size was normal.  5. Right atrial size was normal.  6. The mitral valve is normal in structure. Mild mitral valve  regurgitation. No evidence of mitral stenosis.  7. The tricuspid valve is normal in structure.  8. The tricuspid valve is normal in structure. Tricuspid valve  regurgitation is mild.  9. The aortic valve is tricuspid. Aortic valve regurgitation is mild.  Mild aortic valve stenosis.  10. Pulmonic regurgitation is mild.  11. The pulmonic valve was normal in structure. Pulmonic valve  regurgitation is mild.  12. Aortic dilatation noted.  13. There is mild dilatation of the aortic root measuring 40 mm.  14. Mildly elevated pulmonary artery systolic pressure.  15. The inferior vena cava is normal in size with greater than 50%  respiratory variability, suggesting right atrial pressure of 3 mmHg.   Patient Profile     70 y.o. male with diabetes mellitus type 2, essential hypertension, CAD with LAD stent 2011 after an abnormal nuclear stress test identified anterior wall ischemia presenting with inferior STEMI with distal RCA stenosis unable to cross with wire now planned for medical management.  Assessment & Plan    Inferior ST elevation MI  Known CAD s/p LAD PCI in 2011 Cath with 99% stenosis of distal PCA beyond PDA with tortuous segment. Unable to cross with the wire. Otherwise, 30-40% ostial-to-prox LAD, patent LAD stent, patent LCx. Now on medical management for distal RCA disease. -Continue ASA 81mg  daily -Continue coreg 6.25mg   BID -Continue plavix 75mg  daily -Continue coreg 25mg  BID -Continue heparin gtt for at least 48h for management of ACS--can stop after 8pm tonight -Continue crestor 40mg  daily -Continue imdur 30mg  daily -Awaiting transfer out of ICU -Consult PT/OT due to unsteadiness on his feet -Cardiac rehab  Diabetes mellitus type 2:  -Continue lantus and SSI while in hospital -Resume home meds on discharge  Hyperlipidemia: LDL 144. Crestor increased. Goal <70 -Continue crestor 40mg  daily -Continue fenofibrate -Repeat lipids in 6-8 weeks  Essential hypertension: On losartan HCTZ, amlodipine, and Inderal LA (for blood pressure and tremor) at home. Better controlled this AM. -Continue home amlodipine 5mg  daily -Continue coreg 25mg  BID -Continue imdur 30mg  daily  Somnolence: -Stopped scheduled ativan  Tremors: -Resumed home primidone  For questions or updates, please contact [place] Please consult [email or web address] for contact info under        Signed, Freada Bergeron, MD  01/30/2021, 7:41 AM

## 2021-01-30 NOTE — Progress Notes (Addendum)
[place] for IV heparin Indication: chest pain/ACS  Allergies  Allergen Reactions  . Codeine Other (See Comments)    Vertigo and upset stomach    Patient Measurements: Height: 5\' 10"  (177.8 cm) Weight: 92.7 kg (204 lb 5.9 oz) IBW/kg (Calculated) : 73 Heparin Dosing Weight: 96kg  Vital Signs: Temp: 98.6 F (37 C) (03/13 0400) Temp Source: Oral (03/13 0400) BP: 127/82 (03/13 0700)  Labs: Recent Labs    01/28/21 1029 01/28/21 1029 01/28/21 1356 01/28/21 1608 01/29/21 0211 01/29/21 0957 01/29/21 1738 01/30/21 0156  HGB 14.6  15.6  --   --   --  15.1  --   --  14.6  HCT 43.0  43.1  --   --   --  41.7  --   --  41.4  PLT 143*  --   --   --  145*  --   --  136*  APTT 25  --   --   --   --   --   --   --   LABPROT 13.3  --   --   --   --   --   --   --   INR 1.1  --   --   --   --   --   --   --   HEPARINUNFRC  --    < >  --   --  0.12* 0.17* <0.10* 0.33  CREATININE 0.90  1.04  --   --   --  1.03  --   --  0.82  TROPONINIHS 101*  --  254* 462*  --   --   --   --    < > = values in this interval not displayed.    Estimated Creatinine Clearance: 97.3 mL/min (by C-G formula based on SCr of 0.82 mg/dL).   Medical History: Past Medical History:  Diagnosis Date  . Coronary artery disease    s/p PCI of LAD w Residual 50-60% RCA  . Diabetes ([place])   . Dilated aortic root ([facility])    95mm by echo 11/2019  . High cholesterol   . History of echocardiogram 06/2011   Normal LVF,mild LVH,trivial TR/MR, mild AS/AI  . History of kidney stones   . Hypertension   . Mild aortic stenosis    mild by echo 11/2019  . Tremor     Medications:  Infusions:  . sodium chloride    . heparin 1,800 Units/hr (01/30/21 4315)    Assessment: [name] presenting with chest pain radiating to both arms and was found to have STEMI. PMH is s/f CAD s/p PCI to LAD approximately 10 years ago and he remains on aspirin. He had a cath which revealed 99% stenosis of  his distal RCA with TIMI grade II flow, and this was unable to be opened. Plan is for medical management with aspirin and plavix and heparin.  Heparin level therapeutic at 0.33, on drip rate 1800 units/hr. Hgb stable, pltcs down slightly 136. No overt bleeding or infusion issues noted.  Goal of Therapy:  Heparin level 0.3-0.7 units/ml Monitor platelets by anticoagulation protocol: Yes   Plan:  Continue IV heparin at 1800 units/h Check 6-hr confirmatory HL Daily HL, CBC, s/sx bleeding ============================== PM Update: confirmatory HL remains therapeutic at 0.37 on drip rate 1800 units/hr. Will continue current rate and Check next HL with AM labs.  Richardine Service, PharmD, [place] PGY2 Cardiology Pharmacy Resident Phone: [phone] 01/30/2021  8:26  AM  Please check [email or web address] for unit-specific pharmacy phone numbers.

## 2021-01-30 NOTE — Plan of Care (Signed)

## 2021-01-31 DIAGNOSIS — E119 Type 2 diabetes mellitus without complications: Secondary | ICD-10-CM

## 2021-01-31 DIAGNOSIS — R251 Tremor, unspecified: Secondary | ICD-10-CM

## 2021-01-31 DIAGNOSIS — E785 Hyperlipidemia, unspecified: Secondary | ICD-10-CM

## 2021-01-31 DIAGNOSIS — I77810 Thoracic aortic ectasia: Secondary | ICD-10-CM

## 2021-01-31 DIAGNOSIS — I350 Nonrheumatic aortic (valve) stenosis: Secondary | ICD-10-CM

## 2021-01-31 DIAGNOSIS — I10 Essential (primary) hypertension: Secondary | ICD-10-CM

## 2021-01-31 DIAGNOSIS — Z794 Long term (current) use of insulin: Secondary | ICD-10-CM

## 2021-01-31 DIAGNOSIS — I2119 ST elevation (STEMI) myocardial infarction involving other coronary artery of inferior wall: Secondary | ICD-10-CM | POA: Diagnosis not present

## 2021-01-31 DIAGNOSIS — I25110 Atherosclerotic heart disease of native coronary artery with unstable angina pectoris: Secondary | ICD-10-CM

## 2021-01-31 DIAGNOSIS — E1159 Type 2 diabetes mellitus with other circulatory complications: Secondary | ICD-10-CM

## 2021-01-31 DIAGNOSIS — E1169 Type 2 diabetes mellitus with other specified complication: Secondary | ICD-10-CM

## 2021-01-31 LAB — GLUCOSE, CAPILLARY
Glucose-Capillary: 174 mg/dL — ABNORMAL HIGH (ref 70–99)
Glucose-Capillary: 165 mg/dL — ABNORMAL HIGH (ref 70–99)
Glucose-Capillary: 210 mg/dL — ABNORMAL HIGH (ref 70–99)

## 2021-01-31 LAB — CBC
HCT: 42.4 % (ref 39.0–52.0)
Hemoglobin: 14.9 g/dL (ref 13.0–17.0)
MCH: 31 pg (ref 26.0–34.0)
MCHC: 35.1 g/dL (ref 30.0–36.0)
MCV: 88.3 fL (ref 80.0–100.0)
Platelets: 146 10*3/uL — ABNORMAL LOW (ref 150–400)
RBC: 4.8 MIL/uL (ref 4.22–5.81)
RDW: 13.1 % (ref 11.5–15.5)
WBC: 10.5 10*3/uL (ref 4.0–10.5)
nRBC: 0 % (ref 0.0–0.2)

## 2021-01-31 LAB — BASIC METABOLIC PANEL
Anion gap: 10 (ref 5–15)
BUN: 7 mg/dL — ABNORMAL LOW (ref 8–23)
CO2: 23 mmol/L (ref 22–32)
Calcium: 9.3 mg/dL (ref 8.9–10.3)
Chloride: 101 mmol/L (ref 98–111)
Creatinine, Ser: 1 mg/dL (ref 0.61–1.24)
GFR, Estimated: >60 mL/min (ref >60)
Glucose, Bld: 155 mg/dL — ABNORMAL HIGH (ref 70–99)
Potassium: 3.7 mmol/L (ref 3.5–5.1)
Sodium: 134 mmol/L — ABNORMAL LOW (ref 135–145)

## 2021-01-31 LAB — HEPARIN LEVEL (UNFRACTIONATED): Heparin Unfractionated: <0.1 IU/mL — ABNORMAL LOW (ref 0.30–0.70)

## 2021-01-31 MED ORDER — CARVEDILOL 6.25 MG PO TABS: 6.2500 mg | ORAL_TABLET | Freq: Two times a day (BID) | ORAL | 3 refills | Status: AC

## 2021-01-31 MED ORDER — ENOXAPARIN SODIUM 40 MG/0.4ML ~~LOC~~ SOLN
40.0000 mg | Freq: Every day | SUBCUTANEOUS | Status: DC
  Administered 2021-01-31: 40 mg via SUBCUTANEOUS
  Filled 2021-01-31: qty 0.4

## 2021-01-31 MED ORDER — CLOPIDOGREL BISULFATE 75 MG PO TABS: 75.0000 mg | ORAL_TABLET | Freq: Every day | ORAL | 3 refills | Status: AC

## 2021-01-31 MED ORDER — ISOSORBIDE MONONITRATE ER 30 MG PO TB24: 30.0000 mg | ORAL_TABLET | Freq: Every day | ORAL | 2 refills | Status: DC

## 2021-01-31 MED ORDER — ROSUVASTATIN CALCIUM 40 MG PO TABS: 40.0000 mg | ORAL_TABLET | Freq: Every day | ORAL | 2 refills | Status: DC

## 2021-01-31 MED FILL — Nitroglycerin IV Soln 100 MCG/ML in D5W: INTRA_ARTERIAL | Qty: 10 | Status: AC

## 2021-01-31 NOTE — Progress Notes (Addendum)
Progress Note  Patient Name: [name] [name] Date of Encounter: 01/31/2021  [facility] Cardiologist: Fransico Him, MD   Subjective   [facility] well with cardiac rehab, but still little unsteady on his gait.  Is being seen currently by physical therapy to determine if he would benefit from a walker. No further chest pain.  He is eager to go home.  Inpatient Medications    Scheduled Meds: . amLODipine  10 mg Oral Daily  . aspirin EC  81 mg Oral Daily  . buPROPion  300 mg Oral Daily  . carvedilol  6.25 mg Oral BID WC  . Chlorhexidine Gluconate Cloth  6 each Topical Daily  . clopidogrel  75 mg Oral Q breakfast  . enoxaparin (LOVENOX) injection  40 mg Subcutaneous Daily  . fenofibrate  54 mg Oral Daily  . insulin aspart  0-15 Units Subcutaneous TID WC  . insulin glargine  10 Units Subcutaneous Daily  . isosorbide mononitrate  30 mg Oral Daily  . primidone  100 mg Oral q AM  . primidone  50 mg Oral QHS  . rosuvastatin  40 mg Oral Daily  . sodium chloride flush  3 mL Intravenous Q12H   Continuous Infusions: . sodium chloride     PRN Meds: sodium chloride, acetaminophen, fentaNYL (SUBLIMAZE) injection, ondansetron (ZOFRAN) IV, oxyCODONE, sodium chloride flush   Vital Signs    Vitals:   01/31/21 1200 01/31/21 1300 01/31/21 1400 01/31/21 1500  BP: 102/63 109/73  102/71  Pulse:      Resp: 20 17 18 15   Temp:      TempSrc:      SpO2: 94% 96% 97% 96%  Weight:      Height:        Intake/Output Summary (Last 24 hours) at 01/31/2021 1512 Last data filed at 01/31/2021 0800 Gross per 24 hour  Intake 398.52 ml  Output 975 ml  Net -576.48 ml   Last 3 Weights 01/30/2021 01/29/2021 01/28/2021  Weight (lbs) 204 lb 5.9 oz 205 lb 14.6 oz 236 lb  Weight (kg) 92.7 kg 93.4 kg 107.049 kg      Telemetry    NSR - Personally Reviewed  ECG    No new tracing - Personally Reviewed  Physical Exam   GEN:  Wide-awake.  Awake and oriented x3.  Comfortable.  Ambulating in the hallway  without issues. Neck: No JVD Cardiac:  RRR with normal S1-S2.  2/6 SEM at RUSB.  No other R/T. Respiratory:  CTA B, nonlabored GI:  Soft/NT/ND/NABS, no HSM. MS: No C/C/C Neuro:   Nonfocal, mild tremor. Psych: Normal mood and affect   Labs    High Sensitivity Troponin:   Recent Labs  Lab 01/28/21 1029 01/28/21 1356 01/28/21 1608  TROPONINIHS 101* 254* 462*      Chemistry Recent Labs  Lab 01/28/21 1029 01/29/21 0211 01/30/21 0156 01/31/21 0125  NA 137  134* 136 133* 134*  K 4.0  3.9 3.6 3.5 3.7  CL 101  100 101 101 101  CO2 24 25 22 23   GLUCOSE 281*  272* 242* 212* 155*  BUN 13  10 7* 6* 7*  CREATININE 0.90  1.04 1.03 0.82 1.00  CALCIUM 9.4 9.5 9.1 9.3  PROT 6.3*  --   --   --   ALBUMIN 3.7  --   --   --   AST 25  --   --   --   ALT 19  --   --   --  ALKPHOS 48  --   --   --   BILITOT 1.0  --   --   --   GFRNONAA >60 >60 >60 >60  ANIONGAP 10 10 10 10      Hematology Recent Labs  Lab 01/29/21 0211 01/30/21 0156 01/31/21 0125  WBC 10.4 10.8* 10.5  RBC 4.81 4.72 4.80  HGB 15.1 14.6 14.9  HCT 41.7 41.4 42.4  MCV 86.7 87.7 88.3  MCH 31.4 30.9 31.0  MCHC 36.2* 35.3 35.1  RDW 12.9 12.8 13.1  PLT 145* 136* 146*    BNP Recent Labs  Lab 01/28/21 1713  BNP 200.8*     DDimer No results for input(s): DDIMER in the last 168 hours.   Radiology    No results found.  Cardiac Studies    Echo January 2021: EF 66 5%.  Mild LVH.  No or WMA.  Normal RV size and function.  Normal atrial size.  Mild Aortic Stenosis-with Mild AI.  Aortic root measured 40 mm.  Mildly elevated PAP.  Cath 01/28/21:  RCA continuation 99% stenosis before the origin of 2 left ventricular branches that are relatively small. TIMI grade II flow was noted.  Attempt to angioplasty the vessel was unsuccessful as we were unable to cross with a wire. This resulted in total occlusion of the vessel with recurrence of more substantial symptoms and EKG changes.  Left main is widely  patent.  LAD stent is widely patent and tortuous. Proximal to the LAD stent there is native 30 to 40% narrowing.  Circumflex is widely patent and tortuous.  Inferobasal hypokinesis. EF 60%. LVEDP normal.  RECOMMENDATIONS:   IV hydration; IV nitroglycerin  Resume heparin for 48 hours  Aspirin and Plavix  Beta-blocker therapy  Analgesia with fentanyl transitioning to morphine.      TTE 01/29/2020: EF remains 60 to 65%.  Basal inferolateral and basal inferior HK.  (Consistent with distal RCA occlusion).  Mild LVH.  GR 1 DD.  Trivial MR.  Mild ascending aorta dilation of 41 mm.  Mild AS (mean gradient 11 mmHg).    Patient Profile     70 y.o. male with diabetes mellitus type 2, essential hypertension, CAD with LAD stent 2011 after an abnormal nuclear stress test identified anterior wall ischemia presenting with inferior STEMI with distal RCA stenosis unable to cross with wire now planned for medical management.  Assessment & Plan    Principal Problem:   Acute ST elevation myocardial infarction (STEMI) of inferior wall (HCC) Active Problems:   Coronary artery disease involving native coronary artery of native heart with unstable angina pectoris ([place])   Essential hypertension   Hyperlipidemia associated with type 2 diabetes mellitus (HCC)   Mild aortic stenosis   Ascending aorta dilatation (HCC)   Tremor   DM (diabetes mellitus) ([place])  Principal Problem:   Acute ST elevation myocardial infarction (STEMI) of inferior wall ([place]) /Coronary artery disease involving native coronary artery of native heart with unstable angina pectoris ([place])  Post PTCA attempts led to occlusion of the posterior AV groove branch and posterior lateral system.  Otherwise minimal disease elsewhere.  No further PCI attempt based on tortuosity of the vessel and distal lesion.    Plan medical management only. ->  Was to complete 48 hours of heparin as of 8 PM 01/30/2021  On aspirin and Plavix -> DAPT  x1 year.  Okay to interrupt.  Continue on carvedilol 6.125 mg twice daily, along with Imdur 30 mg daily and amlodipine 5  mg  On Crestor 40 mg daily.  PT/ OT consult yesterday, was unsteady on his feet.  Need to determine stability for discharge.    Essential hypertension -> has been relatively well controlled but high today.  Continue Coreg 25 mg twice daily, amlodipine 5 mg daily and Imdur 30-minute daily.    Hyperlipidemia associated with type 2 diabetes mellitus (HCC)  Continue Lantus and sliding scale insulin while in the hospital.  Reinitiate home meds on discharge.  Consider SGLT2 inhibitor.  Continue Crestor (dose increased from 20 to 40 g) plus fenofibrate with goal LDL<70 (currently 144)    Tremor -> on home primidone     Mild aortic stenosis &Ascending aorta dilatation (HCC) -> both are relatively benign.  Can be monitored in the outpatient setting     Did well walking today.  No further chest pain of IV heparin.  With no further angina or heart failure symptoms, okay for discharge today. -> No new medication changes.    For questions or updates, please contact [place] Please consult [email or web address] for contact info under        Signed, Glenetta Hew, MD  01/31/2021, 3:12 PM

## 2021-01-31 NOTE — Discharge Instructions (Signed)
Heart Attack The heart is a muscle that needs oxygen to survive. A heart attack is a condition that occurs when your heart does not get enough oxygen. When this happens, the heart muscle begins to die. This can cause permanent damage if not treated right away. A heart attack is a medical emergency. This condition may be called a myocardial infarction, or MI. It is also known as acute coronary syndrome (ACS). ACS is a term used to describe a group of conditions that affect blood flow to the heart. What are the causes? This condition may be caused by:  Atherosclerosis. This occurs when a fatty substance called plaque builds up in the arteries and blocks or reduces blood supply to the heart.  A blood clot. A blood clot can develop suddenly when plaque breaks up within an artery and blocks blood flow to the heart.  Low blood pressure.  An abnormal heartbeat (arrhythmia).  Conditions that cause a decrease of oxygen to the heart, such as anemiaorrespiratory failure.  A spasm, or severe tightening, of a blood vessel that cuts off blood flow to the heart.  Tearing of a coronary artery (spontaneous coronary artery dissection).  High blood pressure.   What increases the risk? The following factors may make you more likely to develop this condition:  Aging. The older you are, the higher your risk.  Having a personal or family history of chest pain, heart attack, stroke, or narrowing of the arteries in the legs, arms, head, or stomach (peripheral artery disease).  Being male.  Smoking.  Not getting regular exercise.  Being overweight or obese.  Having high blood pressure.  Having high cholesterol (hypercholesterolemia).  Having diabetes.  Drinking too much alcohol.  Using illegal drugs, such as cocaine or methamphetamine. What are the signs or symptoms? Symptoms of this condition may vary, depending on factors like gender and age. Symptoms may include:  Chest pain. It may feel  like: ? Crushing or squeezing. ? Tightness, pressure, fullness, or heaviness.  Pain in the arm, neck, jaw, back, or upper body.  Shortness of breath.  Heartburn or upset stomach.  Nausea.  Sudden cold sweats.  Feeling tired.  Sudden light-headedness. How is this diagnosed? This condition may be diagnosed through tests, such as:  Electrocardiogram (ECG) to measure the electrical activity of your heart.  Blood tests to check for cardiac markers. These chemicals are released by a damaged heart muscle.  A test to evaluate blood flow and heart function (coronary angiogram).  CT scan to see the heart more clearly.  A test to evaluate the pumping action of the heart (echocardiogram). How is this treated? A heart attack must be treated as soon as possible. Treatment may include:  Medicines to: ? Break up or dissolve blood clots (fibrinolytic therapy). ? Thin blood and help prevent blood clots. ? Treat blood pressure. ? Improve blood flow to the heart. ? Reduce pain. ? Reduce cholesterol.  Angioplasty and stent placement. These are procedures to widen a blocked artery and keep it open.  Coronary artery bypass graft, CABG, or open heart surgery. This enables blood to flow to the heart by going around the blocked part of the artery.  Oxygen therapy if needed.  Cardiac rehabilitation. This improves your health and well-being through exercise, education, and counseling.   Follow these instructions at home: Medicines  Take over-the-counter and prescription medicines only as told by your health care provider.  Do not take the following medicines unless your health care provider  says it is okay to take them: ? NSAIDs, such as ibuprofen. ? Supplements that contain vitamin A, vitamin E, or both. ? Hormone replacement therapy that contains estrogen with or without progestin. Lifestyle  Do not use any products that contain nicotine or tobacco, such as cigarettes, e-cigarettes,  and chewing tobacco. If you need help quitting, ask your health care provider.  Avoid secondhand smoke.  Exercise regularly. Ask your health care provider about participating in a cardiac rehabilitation program that helps you start exercising safely after a heart attack.  Eat a heart-healthy diet. Your health care provider will tell you what foods to eat.  Maintain a healthy weight.  Learn ways to manage stress.  Do not use illegal drugs.   Alcohol use  Do not drink alcohol if: ? Your health care provider tells you not to drink. ? You are pregnant, may be pregnant, or are planning to become pregnant.  If you drink alcohol: ? Limit how much you use to:  0-1 drink a day for women.  0-2 drinks a day for men. ? Be aware of how much alcohol is in your drink. In the [place] one drink equals one 12 oz bottle of beer (355 mL), one 5 oz glass of wine (148 mL), or one 1 oz glass of hard liquor (44 mL). General instructions  Work with your health care provider to manage any other conditions you have, such as high blood pressure or diabetes. These conditions affect your heart.  Get screened for depression, and seek treatment if needed.  Keep your vaccinations up to date. Get the flu vaccine every year.  Keep all follow-up visits as told by your health care provider. This is important. Contact a health care provider if:  You feel overwhelmed or sad.  You have trouble doing your daily activities. Get help right away if:  You have sudden, unexplained discomfort in your chest, arms, back, neck, jaw, or upper body.  You have shortness of breath.  You suddenly start to sweat or your skin gets clammy.  You feel nauseous or you vomit.  You have unexplained tiredness or weakness.  You suddenly feel light-headed or dizzy.  You notice your heart starts to beat fast or feels like it is skipping beats.  You have blood pressure that is higher than 180/120. These symptoms may represent a  serious problem that is an emergency. Do not wait to see if the symptoms will go away. Get medical help right away. Call your local emergency services (911 in the [place] Do not drive yourself to the hospital. Summary  A heart attack, also called myocardial infarction, is a condition that occurs when your heart does not get enough oxygen. This is caused by anything that blocks or reduces blood flow to the heart.  Treatment is a combination of medicines and surgeries, if needed, to open the blocked arteries and restore blood flow to the heart.  A heart attack is an emergency. Get help right away if you have sudden discomfort in your chest, arms, back, neck, jaw, or upper body. Seek help if you feel nauseous, you vomit, or you feel light-headed or dizzy. This information is not intended to replace advice given to you by your health care provider. Make sure you discuss any questions you have with your health care provider. Document Revised: 02/13/2019 Document Reviewed: 02/17/2019 Elsevier Patient Education  [place].

## 2021-01-31 NOTE — Progress Notes (Signed)
CARDIAC REHAB PHASE I   PRE:  Rate/Rhythm: 77 SR  BP:  Sitting: 138/87      SaO2: 94 RA  MODE:  Ambulation: 295 ft   POST:  Rate/Rhythm: 82 SR  BP:  Sitting: 111/92    SaO2: 98 RA   Pt ambulated 271ft in hallway assist of one with front wheel walker. Pt denies CP, SOB, or dizziness. Pt returned to recliner. Pt educated on importance of ASA, Plavix, statin, and NTG. Pt given MI book along with heart healthy and diabetic diets. Reviewed site care, restrictions and exercise guidelines. Will refer to CRP II [place]. Pt denies any questions or concerns, hopeful for d/c today.  [id number] Rufina Falco, RN BSN 01/31/2021 9:24 AM

## 2021-01-31 NOTE — TOC Transition Note (Addendum)
Transition of Care [facility]) - CM/SW Discharge Note   Patient Details  Name: [name] [name] MRN: [id number] Date of Birth: 1951-09-13  [facility] CM/SW Contact:  Angelita Ingles, RN Phone Number: [phone]  01/31/2021, 2:51 PM   Clinical Narrative:    [facility] consulted for patient discharging with need to set up outpatient therapy per PT recommendations. Patient reports that he has transportation to outpatient therapy and is agreeable. Outpatient has set up. No other needs noted. TOC will sign off.   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 CM is unable to set up services for patients wife but has referred patient to his PCP.    Final next level of care: Home/Self Care Barriers to Discharge: No Barriers Identified   Patient Goals and CMS Choice Patient states their goals for this hospitalization and ongoing recovery are:: Ready to o home CMS Medicare.gov Compare Post Acute Care list provided to:: Patient Choice offered to / list presented to : Patient  Discharge Placement                       Discharge Plan and Services                DME Arranged: N/A DME Agency: NA       HH Arranged: NA HH Agency: NA        Social Determinants of Health (SDOH) Interventions     Readmission Risk Interventions No flowsheet data found.

## 2021-01-31 NOTE — Plan of Care (Signed)

## 2021-01-31 NOTE — Evaluation (Signed)
Physical Therapy Evaluation/ Discharge Patient Details Name: [name] [name] MRN: [id number] DOB: December 04, 1950 Today's Date: 01/31/2021   History of Present Illness  70 yo admitted 3/11 with chest pain and STEMI with medical management. PMhx: HLD, HTN, DM, CAD, OSA, tremor  Clinical Impression  Pt pleasant sitting in chair on arrival. Pt reports he is a caregiver for his wife and has to provide significant assist for all aDLs and iADLs for her. Pt works in Administrator, [employer] and plans to return to work and continue to assist wife. Pt self reports progressive unsteadiness and waddling gait over the last 2 years without history of fall. Pt with noted balance deficits with recommendation for cane use and OPPT to which pt verbalized understanding and agreement. Encouraged continued mobility acutely without need for further acute therapy. Will sign off.    HR 82-86 94-96% on RA   Follow Up Recommendations Outpatient PT    Equipment Recommendations  Other (comment) (recommends use of cane with all ambulation and pt reports he has canes at home)    Recommendations for Other Services       Precautions / Restrictions Precautions Precautions: Fall      Mobility  Bed Mobility               General bed mobility comments: in chair on arrival    Transfers Overall transfer level: Modified independent                  Ambulation/Gait Ambulation/Gait assistance: Modified independent (Device/Increase time) Gait Distance (Feet): 400 Feet Assistive device: None Gait Pattern/deviations: Step-through pattern;Decreased stride length;Wide base of support   Gait velocity interpretation: 1.31 - 2.62 ft/sec, indicative of limited community ambulator General Gait Details: pt with increased BOS with gait with noted "waddle", no overt LOB with pt able to perform head turns with gait with decreased speed and cautious gait. Pt reports increased waddling gait with increaed weight and some  unsteadiness noted progressing over the last 2 years  Stairs Stairs: Yes Stairs assistance: Supervision Stair Management: Alternating pattern;Forwards;One rail Right Number of Stairs: 5 General stair comments: pt able to complete stairs with reliance on rail  Wheelchair Mobility    Modified Rankin (Stroke Patients Only)       Balance Overall balance assessment: Needs assistance   Sitting balance-Leahy Scale: Good       Standing balance-Leahy Scale: Good   Single Leg Stance - Right Leg: 2 Single Leg Stance - Left Leg: 2 Tandem Stance - Right Leg: 1 Tandem Stance - Left Leg: 1 [place] - Eyes Opened: 60 [place] - Eyes Closed: 30   High Level Balance Comments: pt able to turn 360 degrees in <4 sec, able to stand and look behind with ability to achieve looking to side bil but not full rotatation             Pertinent Vitals/Pain Pain Assessment: No/denies pain    Home Living Family/patient expects to be discharged to:: Private residence Living Arrangements: Spouse/significant other Available Help at Discharge: Family;Available 24 hours/day Type of Home: House Home Access: Stairs to enter;Ramped entrance   Entrance Stairs-Number of Steps: 5 Home Layout: One level Home Equipment: Walker - 2 wheels;Cane - single point;Wheelchair - manual;Shower seat Additional Comments: no falls in last year    Prior Function Level of Independence: Independent         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        Extremity/Trunk Assessment  Upper Extremity Assessment Upper Extremity Assessment: Overall WFL for tasks assessed    Lower Extremity Assessment Lower Extremity Assessment: Overall WFL for tasks assessed    Cervical / Trunk Assessment Cervical / Trunk Assessment: Normal  Communication   Communication: No difficulties  Cognition Arousal/Alertness: Awake/alert Behavior During Therapy: WFL for tasks assessed/performed Overall  Cognitive Status: Within Functional Limits for tasks assessed                                        General Comments      Exercises     Assessment/Plan    PT Assessment All further PT needs can be met in the next venue of care  PT Problem List Decreased balance;Decreased knowledge of use of DME       PT Treatment Interventions Gait training;Functional mobility training;Patient/family education;Therapeutic activities;Balance training    PT Goals (Current goals can be found in the Care Plan section)  Acute Rehab PT Goals PT Goal Formulation: All assessment and education complete, DC therapy    Frequency     Barriers to discharge        Co-evaluation               AM-PAC PT "6 Clicks" Mobility  Outcome Measure Help needed turning from your back to your side while in a flat bed without using bedrails?: None Help needed moving from lying on your back to sitting on the side of a flat bed without using bedrails?: None Help needed moving to and from a bed to a chair (including a wheelchair)?: None Help needed standing up from a chair using your arms (e.g., wheelchair or bedside chair)?: None Help needed to walk in hospital room?: A Little Help needed climbing 3-5 steps with a railing? : None 6 Click Score: 23    End of Session Equipment Utilized During Treatment: Gait belt Activity Tolerance: Patient tolerated treatment well Patient left: in chair;with call bell/phone within reach Nurse Communication: Mobility status PT Visit Diagnosis: Other abnormalities of gait and mobility (R26.89);Difficulty in walking, not elsewhere classified (R26.2)    Time: 5726-2035 PT Time Calculation (min) (ACUTE ONLY): 15 min   Charges:   PT Evaluation $PT Eval Moderate Complexity: 1 Mod          Sehaj Kolden P, PT Acute Rehabilitation Services Pager: [phone] Office: [phone]   Anab Vivar B Tatum Massman 01/31/2021, 1:12 PM

## 2021-01-31 NOTE — Discharge Summary (Addendum)
Discharge Summary    Patient ID: [name] [name] MRN: [id number]; DOB: 04-05-1951  Admit date: 01/28/2021 Discharge date: 01/31/2021  PCP:  [name], [place]  Cardiologist:  Fransico Him, MD   Discharge Diagnoses    Principal Problem:   Acute ST elevation myocardial infarction (STEMI) of inferior wall [facility]) Active Problems:   Coronary artery disease involving native coronary artery of native heart with unstable angina pectoris [facility])   Essential hypertension   Hyperlipidemia associated with type 2 diabetes mellitus ([place])   Mild aortic stenosis   Ascending aorta dilatation (H[facility])   Tremor   DM (diabetes mellitus) ([place])  Diagnostic Studies/Procedures     Cath 01/28/21:  RCA continuation 99% stenosis before the origin of 2 left ventricular branches that are relatively small.  TIMI grade II flow was noted. Attempt to angioplasty the vessel was unsuccessful as we were unable to cross with a wire.  This resulted in total occlusion of the vessel with recurrence of more substantial symptoms and EKG changes. Left main is widely patent. LAD stent is widely patent and tortuous.  Proximal to the LAD stent there is native 30 to 40% narrowing. Circumflex is widely patent and tortuous. Inferobasal hypokinesis.  EF 60%.  LVEDP normal.   RECOMMENDATIONS:   IV hydration; IV nitroglycerin Resume heparin for 48 hours Aspirin and Plavix Beta-blocker therapy Analgesia with fentanyl transitioning to morphine.       Echocardiogram  01/29/2020: EF remains 60 to 65%.  Basal inferolateral and basal inferior HK.  (Consistent with distal RCA occlusion).  Mild LVH.  GR 1 DD.  Trivial MR.  Mild ascending aorta dilation of 41 mm.  Mild AS (mean gradient 11 mmHg).   History of Present Illness     [name] [name] is a 70 y.o. male with a hx of DM2, HTN, and CAD with prior LAD PCI 2011 after an abnormal nuclear stress test identified anterior wall ischemia who  presented to [facility] 01/28/21 with an acute inferior STEMI.   [name] [name] reported that he ate breakfast the day of hospital presentation and later developed what he felt was indigestion.  His symptoms became more severe with radiation to bilateral upper arms.  Given this, he called EMS around 9 AM.  EKG showed hyperacute ST-T abnormality in leads II, III and aVF with reciprocal precordial ST wave abnormalities in V1 and V2.  He was given several nitroglycerin tablets with improvement in symptoms.  Given EKG changes, STEMI activation was felt appropriate.  Hospital Course    He was taken emergently to the cardiac cath lab which showed a 99% RCA stenosis before the origin of 2 left ventricular branches.  Angioplasty attempt was unsuccessful as the wire was unable to cross.  There was patent left main, LAD and circumflex arteries.  There was a small proximal LAD 30 to 40% narrowing at prior stent placement site.  LVEDP was normal.  Plan was for IV hydration, IV nitroglycerin and resume heparin for 48 hours along with ASA, Plavix and beta-blocker therapies.  DAPT with ASA and Plavix long-term was recommended.  Echocardiogram showed normal LVEF at 60 to 65% with regional wall motion abnormalities, mild LVH, G1 DD, trivial AR and mild left ear with a mean gradient at 11.0 mmHg.  There was aortic dilatation measuring 41 mm.  After transfer to [facility] following cath for STEMI, he continued to have chest pain however noted that it was much improved.  Nitroglycerin infusion  was uptitrated to 80 mcg/min with as needed fentanyl ordered.  Lab work showed an LDL at 44 therefore Crestor 40 mg daily was initiated.  Unfortunately, hemoglobin A1c was also elevated at 9.6 with a consultation with diabetes coordinator although it appears that prior levels have been up to 13.7% last year>>therefore this is improved.   PT/OT was consulted due to unsteadiness on his feet with cardiac rehab.  On day of discharge, PT recommended patient  following with [facility] PT with walker at discharge which we will order.  He was seen by cardiac rehabilitation and ambulated 295 feet in the hallway with a front wheel walker without anginal symptoms.  He was educated on the importance of ASA, Plavix, statin and nitroglycerin.  He will be referred for CRP II. Cr  Hospital problems as below:  Inferior ST elevation MI with known CAD s/p LAD PCI in 2011: -LHC with 99% stenosis of distal PCA beyond PDA with tortuous segment. Unable to cross with the wire. Otherwise, 30-40% ostial-to-prox LAD, patent LAD stent, patent LCx. Now on medical management for distal RCA disease. -Continue ASA 81mg  daily -Continue plavix 75mg  daily -Continue coreg 6.25mg  BID -Continue crestor -Continue imdur 30mg  daily   Diabetes mellitus type 2:  -Resume home meds on discharge however given elevated Hb A1c will need to consider SGLT2 and close follow up with PCP after discharge.  -Per diabetes coordinator, "explained how hyperglycemia leads to damage within blood vessels which lead to the common complications seen with uncontrolled diabetes. Stressed to the patient the importance of improving glycemic control to prevent further complications from uncontrolled diabetes. Inquired about potential of using continuous glucose monitoring sensor and patient states that his PCP has mentioned it but he was not sure if it would stay on due to the type of work he does.  Discussed FreeStyle Libre and explained how it could provide more data on glucose trends for him and his provider which could help with making medication changes to improve DM control. Encouraged patient to talk with his PCP again about prescribing so he could give it a try. Patient verbalized understanding of information discussed and reports no further questions at this time related to diabetes."   Hyperlipidemia:  -Continue crestor 40mg  daily -Continue fenofibrate -Previously noted to be on Repatha    Essential  hypertension:  -PTA amlodipine was restarted however will hold PTA losartan/HCTZ due to softer BPs on day of discharge  -Continue coreg 6.25mg  BID -Start imdur as above   Somnolence: -Stop scheduled ativan   Tremors: -Resume home primidone  Mild aortic stenosis &Ascending aorta dilatation: -Relatively benign with plans to monitor in the outpatient setting    Consultants: The patient was seen and examined by Dr. Ellyn Hack who feels that he is stable and ready for discharge today, 01/31/21.    Did the patient have an acute coronary syndrome (MI, NSTEMI, STEMI, etc) this admission?:  Yes                               AHA/ACC Clinical Performance & Quality Measures: Aspirin prescribed? - Yes ADP Receptor Inhibitor (Plavix/Clopidogrel, Brilinta/Ticagrelor or Effient/Prasugrel) prescribed (includes medically managed patients)? - Yes Beta Blocker prescribed? - Yes High Intensity Statin (Lipitor 40-80mg  or Crestor 20-40mg ) prescribed? - Yes EF assessed during THIS hospitalization? - Yes For EF <40%, was ACEI/ARB prescribed? - Not Applicable (EF >/= 27%) For EF <40%, Aldosterone Antagonist (Spironolactone or Eplerenone) prescribed? - Not Applicable (  EF >/= 40%) Cardiac Rehab Phase II ordered (including medically managed patients)? - Yes   ____________  Discharge Vitals Blood pressure 102/71, pulse 76, temperature 98.1 F (36.7 C), temperature source Oral, resp. rate 15, height 5\' 10"  (1.778 m), weight 92.7 kg, SpO2 96 %.  Filed Weights   01/29/21 0513 01/30/21 0500  Weight: 93.4 kg 92.7 kg   Labs & Radiologic Studies    CBC Recent Labs    01/30/21 0156 01/31/21 0125  WBC 10.8* 10.5  HGB 14.6 14.9  HCT 41.4 42.4  MCV 87.7 88.3  PLT 136* 341*   Basic Metabolic Panel Recent Labs    01/30/21 0156 01/31/21 0125  NA 133* 134*  K 3.5 3.7  CL 101 101  CO2 22 23  GLUCOSE 212* 155*  BUN 6* 7*  CREATININE 0.82 1.00  CALCIUM 9.1 9.3   Liver Function Tests No results for  input(s): AST, ALT, ALKPHOS, BILITOT, PROT, ALBUMIN in the last 72 hours. No results for input(s): LIPASE, AMYLASE in the last 72 hours. High Sensitivity Troponin:   Recent Labs  Lab 01/28/21 1029 01/28/21 1356 01/28/21 1608  TROPONINIHS 101* 254* 462*    BNP Invalid input(s): POCBNP D-Dimer No results for input(s): DDIMER in the last 72 hours. Hemoglobin A1C No results for input(s): HGBA1C in the last 72 hours. Fasting Lipid Panel No results for input(s): CHOL, HDL, LDLCALC, TRIG, CHOLHDL, LDLDIRECT in the last 72 hours. Thyroid Function Tests No results for input(s): TSH, T4TOTAL, T3FREE, THYROIDAB in the last 72 hours.  Invalid input(s): FREET3 _____________  CARDIAC CATHETERIZATION  Result Date: 01/28/2021  RCA continuation 99% stenosis before the origin of 2 left ventricular branches that are relatively small.  TIMI grade II flow was noted.  Attempt to angioplasty the vessel was unsuccessful as we were unable to cross with a wire.  This resulted in total occlusion of the vessel with recurrence of more substantial symptoms and EKG changes.  Left main is widely patent.  LAD stent is widely patent and tortuous.  Proximal to the LAD stent there is native 30 to 40% narrowing.  Circumflex is widely patent and tortuous.  Inferobasal hypokinesis.  EF 60%.  LVEDP normal. RECOMMENDATIONS:  IV hydration  IV nitroglycerin  Resume heparin for 48 hours  Aspirin and Plavix  Beta-blocker therapy  Analgesia with fentanyl transitioning to morphine.   ECHOCARDIOGRAM COMPLETE  Result Date: 01/28/2021    ECHOCARDIOGRAM REPORT   Patient Name:   [name] [name] Date of Exam: 01/28/2021 Medical Rec #:  [id number]     Height:       70.0 in Accession #:    [id number]    Weight:       236.0 lb Date of Birth:  May 29, 1951     BSA:          2.239 m Patient Age:    39 years      BP:           180/103 mmHg Patient Gender: M             HR:           69 bpm. Exam Location:  Inpatient Procedure: 2D Echo,  Cardiac Doppler and Color Doppler                               MODIFIED REPORT: This report was modified by Cherlynn Kaiser MD on 01/28/2021 due to revision.  Indications:  Chest Pain R07.9  History:         Patient has prior history of Echocardiogram examinations, most                  recent 12/16/2019. CAD; Risk Factors:Hypertension, Dyslipidemia,                  Diabetes and Non-Smoker. Dilated aortic root. Mild AS.  Sonographer:     Vickie Epley RDCS Referring Phys:  [id number] Darreld Mclean Diagnosing Phys: Cherlynn Kaiser MD IMPRESSIONS  1. Left ventricular ejection fraction, by estimation, is 60 to 65%. The left ventricle has normal function. The left ventricle demonstrates regional wall motion abnormalities (see scoring diagram/findings for description). There is mild left ventricular  hypertrophy. Left ventricular diastolic parameters are consistent with Grade I diastolic dysfunction (impaired relaxation).  2. Right ventricular systolic function is normal. The right ventricular size is normal. There is normal pulmonary artery systolic pressure.  3. The mitral valve is normal in structure. Trivial mitral valve regurgitation. No evidence of mitral stenosis.  4. The aortic valve is abnormal. There is moderate calcification of the aortic valve. Aortic valve regurgitation is trivial. Mild aortic valve stenosis. Aortic valve mean gradient measures 11.0 mmHg.  5. Aortic dilatation noted. There is mild dilatation of the ascending aorta, measuring 41 mm.  6. The inferior vena cava is normal in size with greater than 50% respiratory variability, suggesting right atrial pressure of 3 mmHg. FINDINGS  Left Ventricle: Left ventricular ejection fraction, by estimation, is 60 to 65%. The left ventricle has normal function. The left ventricle demonstrates regional wall motion abnormalities. The left ventricular internal cavity size was normal in size. There is mild left ventricular hypertrophy. Left ventricular  diastolic parameters are consistent with Grade I diastolic dysfunction (impaired relaxation).  LV Wall Scoring: The basal inferolateral segment and basal inferior segment are hypokinetic. Right Ventricle: The right ventricular size is normal. No increase in right ventricular wall thickness. Right ventricular systolic function is normal. There is normal pulmonary artery systolic pressure. The tricuspid regurgitant velocity is 1.94 m/s, and  with an assumed right atrial pressure of 3 mmHg, the estimated right ventricular systolic pressure is 93.7 mmHg. Left Atrium: Left atrial size was normal in size. Right Atrium: Right atrial size was normal in size. Pericardium: There is no evidence of pericardial effusion. Mitral Valve: The mitral valve is normal in structure. Trivial mitral valve regurgitation. No evidence of mitral valve stenosis. Tricuspid Valve: The tricuspid valve is normal in structure. Tricuspid valve regurgitation is not demonstrated. No evidence of tricuspid stenosis. Aortic Valve: The aortic valve is abnormal. There is moderate calcification of the aortic valve. Aortic valve regurgitation is trivial. Aortic regurgitation PHT measures 701 msec. Mild aortic stenosis is present. Aortic valve mean gradient measures 11.0 mmHg. Aortic valve peak gradient measures 19.2 mmHg. Aortic valve area, by VTI measures 2.44 cm. Pulmonic Valve: The pulmonic valve was grossly normal. Pulmonic valve regurgitation is trivial. No evidence of pulmonic stenosis. Aorta: Aortic dilatation noted. There is mild dilatation of the ascending aorta, measuring 41 mm. Venous: The inferior vena cava is normal in size with greater than 50% respiratory variability, suggesting right atrial pressure of 3 mmHg. IAS/Shunts: No atrial level shunt detected by color flow Doppler.  LEFT VENTRICLE PLAX 2D LVIDd:         4.20 cm  Diastology LVIDs:         3.20 cm  LV e' medial:    3.65 cm/s LV  PW:         1.10 cm  LV E/e' medial:  16.0 LV IVS:         1.10 cm  LV e' lateral:   6.08 cm/s LVOT diam:     2.50 cm  LV E/e' lateral: 9.6 LV SV:         108 LV SV Index:   48 LVOT Area:     4.91 cm  RIGHT VENTRICLE RV S prime:     12.10 cm/s TAPSE (M-mode): 2.2 cm LEFT ATRIUM             Index       RIGHT ATRIUM           Index LA diam:        3.90 cm 1.74 cm/m  RA Area:     15.10 cm LA Vol (A2C):   34.4 ml 15.36 ml/m RA Volume:   36.30 ml  16.21 ml/m LA Vol (A4C):   37.5 ml 16.75 ml/m LA Biplane Vol: 38.1 ml 17.01 ml/m  AORTIC VALVE AV Area (Vmax):    2.53 cm AV Area (Vmean):   2.46 cm AV Area (VTI):     2.44 cm AV Vmax:           219.00 cm/s AV Vmean:          156.000 cm/s AV VTI:            0.440 m AV Peak Grad:      19.2 mmHg AV Mean Grad:      11.0 mmHg LVOT Vmax:         113.00 cm/s LVOT Vmean:        78.300 cm/s LVOT VTI:          0.219 m LVOT/AV VTI ratio: 0.50 AI PHT:            701 msec  AORTA Ao Root diam: 3.40 cm Ao Asc diam:  4.10 cm MITRAL VALVE               TRICUSPID VALVE MV Area (PHT): 3.31 cm    TR Peak grad:   15.1 mmHg MV Decel Time: 229 msec    TR Vmax:        194.00 cm/s MV E velocity: 58.30 cm/s MV A velocity: 91.90 cm/s  SHUNTS MV E/A ratio:  0.63        Systemic VTI:  0.22 m                            Systemic Diam: 2.50 cm Cherlynn Kaiser MD Electronically signed by Cherlynn Kaiser MD Signature Date/Time: 01/28/2021/4:19:31 PM    Final (Updated)    Disposition   Pt is being discharged home today in good condition.  Follow-up Plans & Appointments    Follow-up Information     Sueanne Margarita, MD Follow up on 02/08/2021.   Specialty: Cardiology Why: at 140pm  Contact information: [address] [place] [address] [phone]                Discharge Instructions     Amb Referral to Cardiac Rehabilitation   Complete by: As directed    Diagnosis: STEMI   After initial evaluation and assessments completed: Virtual Based Care may be provided alone or in conjunction with Phase 2 Cardiac Rehab  based on patient barriers.: Yes   Ambulatory referral to Physical Therapy  Complete by: As directed    Call MD for:  difficulty breathing, headache or visual disturbances   Complete by: As directed    Call MD for:  extreme fatigue   Complete by: As directed    Call MD for:  hives   Complete by: As directed    Call MD for:  persistant dizziness or light-headedness   Complete by: As directed    Call MD for:  persistant nausea and vomiting   Complete by: As directed    Call MD for:  redness, tenderness, or signs of infection (pain, swelling, redness, odor or green/yellow discharge around incision site)   Complete by: As directed    Call MD for:  severe uncontrolled pain   Complete by: As directed    Call MD for:  temperature >100.4   Complete by: As directed    Diet - low sodium heart healthy   Complete by: As directed    Discharge instructions   Complete by: As directed    PLEASE DO NOT [place]!!!!! Also keep a log of you blood pressures and bring back to your follow up appt. Please call the office with any questions.   Patients taking blood thinners should generally stay away from medicines like ibuprofen, Advil, Motrin, naproxen, and Aleve due to risk of stomach bleeding. You may take Tylenol as directed or talk to your primary doctor about alternatives.  If you have reflux symptoms, please use Protonix for less chance of interaction  PLEASE ENSURE THAT YOU DO NOT RUN OUT OF YOUR PLAVIX. This medication is very important to remain on this indefinitely. IF you have issues obtaining this medication due to cost please CALL the office 3-5 business days prior to running out in order to prevent missing doses of this medication.   No driving for 3-5 days. No lifting over 5 lbs for 1 week. No sexual activity for 1 week. Keep procedure site clean & dry. If you notice increased pain, swelling, bleeding or pus, call/return!  You may shower, but no soaking baths/hot  tubs/pools for 1 week.   Increase activity slowly   Complete by: As directed       Discharge Medications   Allergies as of 01/31/2021       Reactions   Codeine Other (See Comments)   Vertigo and upset stomach        Medication List     STOP taking these medications    losartan-hydrochlorothiazide 100-12.5 MG tablet Commonly known as: HYZAAR   losartan-hydrochlorothiazide 100-25 MG tablet Commonly known as: Hyzaar   naproxen sodium 220 MG tablet Commonly known as: ALEVE       TAKE these medications    allopurinol 300 MG tablet Commonly known as: ZYLOPRIM Take 1 tablet (300 mg total) by mouth daily.   amLODipine 10 MG tablet Commonly known as: NORVASC Take 1 tablet (10 mg total) by mouth daily.   aspirin 81 MG tablet Take 81 mg by mouth daily.   buPROPion 300 MG 24 hr tablet Commonly known as: WELLBUTRIN XL Take 300 mg by mouth daily. What changed: Another medication with the same name was removed. Continue taking this medication, and follow the directions you see here.   carvedilol 6.25 MG tablet Commonly known as: COREG Take 1 tablet (6.25 mg total) by mouth 2 (two) times daily with a meal. What changed:  medication strength how much to take   clopidogrel 75 MG tablet Commonly known as: PLAVIX Take  1 tablet (75 mg total) by mouth daily with breakfast. Start taking on: February 01, 2021   ezetimibe 10 MG tablet Commonly known as: ZETIA TAKE 1 TABLET BY MOUTH EVERY DAY   fenofibrate 145 MG tablet Commonly known as: Tricor Take 1 tablet (145 mg total) by mouth daily.   FreeStyle Lite Devi Please dispense continuous glucose monitoring system thank you   insulin glargine 100 UNIT/ML Solostar Pen Commonly known as: LANTUS Inject 10 Units into the skin daily. What changed:  how much to take when to take this   isosorbide mononitrate 30 MG 24 hr tablet Commonly known as: IMDUR Take 1 tablet (30 mg total) by mouth daily. Start taking on: February 01, 2021   nitroGLYCERIN 0.4 MG SL tablet Commonly known as: NITROSTAT Place 1 tablet (0.4 mg total) under the tongue every 5 (five) minutes as needed for chest pain.   primidone 50 MG tablet Commonly known as: MYSOLINE Take 1 tablet (50 mg total) by mouth 3 (three) times daily. Please call [phone] to schedule appt. What changed:  how much to take when to take this additional instructions   Repatha SureClick 268 MG/ML Soaj Generic drug: Evolocumab Inject 1 pen into the skin every 14 (fourteen) days. What changed: how much to take   rosuvastatin 40 MG tablet Commonly known as: CRESTOR Take 1 tablet (40 mg total) by mouth daily. Start taking on: February 01, 2021 What changed:  medication strength how much to take   Vitamin D 125 MCG (5000 UT) Caps Take 1 tablet by mouth daily.   VITAMIN D (CHOLECALCIFEROL) PO Take 1 tablet by mouth every morning. chewable   VITAMIN E PO Take 1 capsule by mouth daily.               Durable Medical Equipment  (From admission, onward)           Start     Ordered   01/31/21 1342  DME Walker  Once       Question Answer Comment  Walker: With 5 Inch Wheels   Patient needs a walker to treat with the following condition CAD (coronary artery disease)      01/31/21 1348           Outstanding Labs/Studies   None   Duration of Discharge Encounter   Greater than 30 minutes including physician time.  Signed, Kathyrn Drown, NP 01/31/2021, 3:11 PM  I saw & examined the patient on the AM of d/c - see final Progress Note for details.    Agree with D/c summary -- his Carvedilol dose @ home was 25 mg BID - it has been reduced to 6.25 mg bid.  Glenetta Hew, MD

## 2021-02-02 ENCOUNTER — Telehealth (HOSPITAL_COMMUNITY): Payer: Self-pay

## 2021-02-02 NOTE — Telephone Encounter (Signed)
Pt insurance is active and benefits verified through [facility]. Co-pay $10.00, DED $0.00/$0.00 met, out of pocket $3,900.00/$0.00 met, co-insurance 0%. No pre-authorization required. Michael/[employer], 02/02/21 @ 11:35AM, DNQ#[id number]  Will contact patient to see if he is interested in the Cardiac Rehab Program. If interested, patient will need to complete follow up appt. Once completed, patient will be contacted for scheduling upon review by the RN Navigator.

## 2021-02-02 NOTE — Telephone Encounter (Signed)
Called and spoke with pt in regards to CR, pt stated he is not interested at this time. He is the caregiver for his wife.  Closed referral

## 2021-02-08 ENCOUNTER — Encounter: Payer: Self-pay | Admitting: Cardiology

## 2021-02-08 ENCOUNTER — Ambulatory Visit: Payer: Medicare HMO | Admitting: Cardiology

## 2021-02-08 ENCOUNTER — Other Ambulatory Visit: Payer: Self-pay

## 2021-02-08 VITALS — BP 110/60 | HR 82 | Ht 70.0 in | Wt 198.4 lb

## 2021-02-08 DIAGNOSIS — I25110 Atherosclerotic heart disease of native coronary artery with unstable angina pectoris: Secondary | ICD-10-CM

## 2021-02-08 DIAGNOSIS — I10 Essential (primary) hypertension: Secondary | ICD-10-CM

## 2021-02-08 DIAGNOSIS — E1169 Type 2 diabetes mellitus with other specified complication: Secondary | ICD-10-CM | POA: Diagnosis not present

## 2021-02-08 DIAGNOSIS — I77810 Thoracic aortic ectasia: Secondary | ICD-10-CM

## 2021-02-08 DIAGNOSIS — F324 Major depressive disorder, single episode, in partial remission: Secondary | ICD-10-CM | POA: Diagnosis not present

## 2021-02-08 DIAGNOSIS — E7800 Pure hypercholesterolemia, unspecified: Secondary | ICD-10-CM | POA: Diagnosis not present

## 2021-02-08 DIAGNOSIS — I350 Nonrheumatic aortic (valve) stenosis: Secondary | ICD-10-CM

## 2021-02-08 MED ORDER — NITROGLYCERIN 0.4 MG SL SUBL: 0.4000 mg | SUBLINGUAL_TABLET | SUBLINGUAL | 6 refills | Status: DC | PRN

## 2021-02-08 MED ORDER — REPATHA SURECLICK 140 MG/ML ~~LOC~~ SOAJ: 140.0000 mg | SUBCUTANEOUS | 3 refills | Status: DC

## 2021-02-08 NOTE — Patient Instructions (Addendum)
Medication Instructions:  1) RESTART taking Repatha every 14 days  *If you need a refill on your cardiac medications before your next appointment, please call your pharmacy*   Lab Work: Fasting lipids and ALT 6 weeks after restarting Repatha - call us once you restart to schedule.   If you have labs (blood work) drawn today and your tests are completely normal, you will receive your results only by: Marland Kitchen MyChart Message (if you have MyChart) OR . A paper copy in the mail If you have any lab test that is abnormal or we need to change your treatment, we will call you to review the results.   Follow-Up: At [facility], you and your health needs are our priority.  As part of our continuing mission to provide you with exceptional heart care, we have created designated Provider Care Teams.  These Care Teams include your primary Cardiologist (physician) and Advanced Practice Providers (APPs -  Physician Assistants and Nurse Practitioners) who all work together to provide you with the care you need, when you need it.  Your next appointment:   6 month(s)  The format for your next appointment:   In Person  Provider:   You may see Fransico Him, MD or one of the following Advanced Practice Providers on your designated Care Team:    Melina Copa, PA-C  Ermalinda Barrios, PA-C

## 2021-02-08 NOTE — Progress Notes (Signed)
[facility].  Date:  02/08/2021   ID:  [name], [name] Jun 25, 1951, MRN [id number]  PCP:  [name], MD  Cardiologist:  Fransico Him, MD  Electrophysiologist:  None   Chief Complaint:  CAD, AS, HTN  History of Present Illness:    [name] [name] is a 70 y.o. male with a history of ASCAD s/-p PCI LAD and residual RCA of 50-60%, mild AS by echo 2019, HTN and dyslipidemia.  He was hospitalized 01/28/2021 with complaints of indigestion and arm pain and was diagnosed with inferior STEMI.  He was taken emergently to the cath lab which showed 99% dRCA beyond the PDA with 30-40% in prox LAD prior to LAD stent and patent LAD stent.  There was 70% pRCA and 50% and distal RCA prior to the 99% stenosis.  PTCA was attempted but unsuccessful and medical therapy was recommended.  2D echo showed normal LVF with EF 60-65% with G1DD, mild AS with mean AVG 33mmHg and mildly dilated aorta at 34mm.   He is here today for followup and is doing well.  Since his MI he has had 2 twinges of chest discomfort but only for a second.  He has not had any arm pain.  He has not really had any SOB but does notice some mild shortwindedness when he walks to the mailbox up an incline. He denies any PND, orthopnea, LE edema, palpitations or syncope. He is compliant with his meds and is tolerating meds with no SE.    Prior CV studies:   The following studies were reviewed today:  Cardiac Cath 01/29/2020  Conclusion   RCA continuation 99% stenosis before the origin of 2 left ventricular branches that are relatively small.  TIMI grade II flow was noted.  Attempt to angioplasty the vessel was unsuccessful as we were unable to cross with a wire.  This resulted in total occlusion of the vessel with recurrence of more substantial symptoms and EKG changes.  Left main is widely patent.  LAD stent is widely patent and tortuous.  Proximal to the LAD stent there is native 30 to 40% narrowing.  Circumflex is widely patent and  tortuous.  Inferobasal hypokinesis.  EF 60%.  LVEDP normal.  RECOMMENDATIONS:   IV hydration  IV nitroglycerin  Resume heparin for 48 hours  Aspirin and Plavix  Beta-blocker therapy  Analgesia with fentanyl transitioning to morphine.  Diagnostic Dominance: Right    Intervention     2D echo 01/28/2021 IMPRESSIONS   1. Left ventricular ejection fraction, by estimation, is 60 to 65%. The  left ventricle has normal function. The left ventricle demonstrates  regional wall motion abnormalities (see scoring diagram/findings for  description). There is mild left ventricular  hypertrophy. Left ventricular diastolic parameters are consistent with  Grade I diastolic dysfunction (impaired relaxation).  2. Right ventricular systolic function is normal. The right ventricular  size is normal. There is normal pulmonary artery systolic pressure.  3. The mitral valve is normal in structure. Trivial mitral valve  regurgitation. No evidence of mitral stenosis.  4. The aortic valve is abnormal. There is moderate calcification of the  aortic valve. Aortic valve regurgitation is trivial. Mild aortic valve  stenosis. Aortic valve mean gradient measures 11.0 mmHg.  5. Aortic dilatation noted. There is mild dilatation of the ascending  aorta, measuring 41 mm.  6. The inferior vena cava is normal in size with greater than 50%  respiratory variability, suggesting right atrial pressure of 3 mmHg.   Past  Medical History:  Diagnosis Date  . Coronary artery disease    s/p PCI of LAD w Residual 50-60% RCA; s/p inferior STEMI 01/28/2021 with 99% dRCA beyond the PDA, 50% mid and distal PCA, 70% pRCA, 40% pLAD prior to LAD stent on cath  s/p failed PTCA>>on medical therapy  . Diabetes ([place])   . Dilated aortic root (H[facility])    56mm by echo 11/2019  . High cholesterol   . History of echocardiogram 06/2011   Normal LVF,mild LVH,trivial TR/MR, mild AS/AI  . History of kidney stones   .  Hypertension   . Mild aortic stenosis    mild by echo 11/2019  . Tremor    Past Surgical History:  Procedure Laterality Date  . arm surgery Right   . LEFT HEART CATH AND CORONARY ANGIOGRAPHY N/A 01/28/2021   Procedure: LEFT HEART CATH AND CORONARY ANGIOGRAPHY;  Surgeon: Belva Crome, MD;  Location: [facility];  Service: Cardiovascular;  Laterality: N/A;  . stents - placement     x 2     Current Meds  Medication Sig  . allopurinol (ZYLOPRIM) 300 MG tablet Take 1 tablet (300 mg total) by mouth daily.  Marland Kitchen aspirin 81 MG tablet Take 81 mg by mouth daily.  . Blood Glucose Monitoring Suppl (FREESTYLE LITE) DEVI Please dispense continuous glucose monitoring system thank you  . buPROPion (WELLBUTRIN XL) 300 MG 24 hr tablet Take 300 mg by mouth daily.  . carvedilol (COREG) 6.25 MG tablet Take 1 tablet (6.25 mg total) by mouth 2 (two) times daily with a meal.  . Cholecalciferol (VITAMIN D) 125 MCG (5000 UT) CAPS Take 1 tablet by mouth daily.  . clopidogrel (PLAVIX) 75 MG tablet Take 1 tablet (75 mg total) by mouth daily with breakfast.  . Evolocumab (REPATHA SURECLICK) 062 MG/ML SOAJ Inject 1 pen into the skin every 14 (fourteen) days. (Patient taking differently: Inject 140 mg into the skin every 14 (fourteen) days.)  . fenofibrate (TRICOR) 145 MG tablet Take 1 tablet (145 mg total) by mouth daily.  . Insulin Glargine (LANTUS) 100 UNIT/ML Solostar Pen Inject 10 Units into the skin daily. (Patient taking differently: Inject 14 Units into the skin every morning.)  . isosorbide mononitrate (IMDUR) 30 MG 24 hr tablet Take 1 tablet (30 mg total) by mouth daily.  . primidone (MYSOLINE) 50 MG tablet Take 1 tablet (50 mg total) by mouth 3 (three) times daily. Please call [phone] to schedule appt. (Patient taking differently: Take 50-100 mg by mouth See admin instructions. Take 2 tablets (100 mg) by mouth every morning and 1 tablet (50 mg) at night. Please call [phone] to schedule appt.)  .  rosuvastatin (CRESTOR) 40 MG tablet Take 1 tablet (40 mg total) by mouth daily.  Marland Kitchen VITAMIN D, CHOLECALCIFEROL, PO Take 1 tablet by mouth every morning. chewable  . VITAMIN E PO Take 1 capsule by mouth daily.     Allergies:   Codeine   Social History   Tobacco Use  . Smoking status: Never Smoker  . Smokeless tobacco: Never Used  Vaping Use  . Vaping Use: Never used  Substance Use Topics  . Alcohol use: Yes    Comment: socially  . Drug use: No     Family Hx: The patient's family history includes CAD in his brother and mother; Emphysema in his mother; Heart failure in his mother; Other in his father.  ROS:   Please see the history of present illness.  All other systems reviewed and are negative.   Labs/Other Tests and Data Reviewed:    Recent Labs: 01/28/2021: ALT 19; B Natriuretic Peptide 200.8 01/31/2021: BUN 7; Creatinine, Ser 1.00; Hemoglobin 14.9; Platelets 146; Potassium 3.7; Sodium 134   Recent Lipid Panel Lab Results  Component Value Date/Time   CHOL 224 (H) 01/28/2021 10:29 AM   CHOL 120 05/06/2020 07:16 AM   TRIG 218 (H) 01/28/2021 10:29 AM   HDL 36 (L) 01/28/2021 10:29 AM   HDL 37 (L) 05/06/2020 07:16 AM   CHOLHDL 6.2 01/28/2021 10:29 AM   LDLCALC 144 (H) 01/28/2021 10:29 AM   LDLCALC 38 05/06/2020 07:16 AM   LDLDIRECT 54.0 08/18/2015 07:33 AM    Wt Readings from Last 3 Encounters:  02/08/21 198 lb 6.4 oz (90 kg)  01/30/21 204 lb 5.9 oz (92.7 kg)  11/28/19 235 lb (106.6 kg)     Objective:    Vital Signs:  BP 110/60   Pulse 82   Ht 5\' 10"  (1.778 m)   Wt 198 lb 6.4 oz (90 kg)   SpO2 93%   BMI 28.47 kg/m   GEN: Well nourished, well developed in no acute distress HEENT: Normal NECK: No JVD; No carotid bruits LYMPHATICS: No lymphadenopathy CARDIAC:RRR, no  rubs, gallops.  2/6 SM at RUSB RESPIRATORY:  Clear to auscultation without rales, wheezing or rhonchi  ABDOMEN: Soft, non-tender, non-distended MUSCULOSKELETAL:  No edema; No deformity   SKIN: Warm and dry NEUROLOGIC:  Alert and oriented x 3 PSYCHIATRIC:  Normal affect    ASSESSMENT & PLAN:   1. ASCAD -s/p PCI LAD and residual RCA of 50-60% -s/p inferior STEMI 01/28/2021 with cath showing 99% dRCA beyond the PDA, 50% mid and distal PCA, 70% pRCA, 40% pLAD prior to LAD stent on cath  s/p failed PTCA>>on medical therapy -he has not had any further angina since his cath -continue ASA 81mg  daily, Plavix 75mg  daily, BB, long acting nitrate and statin  2.  HTN -BP well controlled -continue amlodipine 10mg  daily, Hyzaar 100-25mg  daily and Carvedilol 6.25mg  BID -creatinine was 1 on 01/31/2021  3.  HLD -LDL goal < 70 -LDL 144 and TAGs 218 -Crestor increased to 40mg  daily and tricor 145mg  daily -apparently he ran out of his Repatha a few months ago and never contacted [place] -check FLP and ALT in 6 weeks after restarting Repatha  4.  Mild AS -mean AVG was 99mmHg by echo 2019 and 49mmHg by echo 01/2021  5.  Dilated ascending aorta -2D echo showed mild dilated ascending aorta at 33mm and 14mm on echo 01/2021 -continue statin and BP control   Medication Adjustments/Labs and Tests Ordered: Current medicines are reviewed at length with the patient today.  Concerns regarding medicines are outlined above.  Tests Ordered: No orders of the defined types were placed in this encounter.  Medication Changes: No orders of the defined types were placed in this encounter.   Disposition:  Follow up in 1 year(s)  Signed, Fransico Him, MD  02/08/2021 1:26 PM     [facility]

## 2021-02-08 NOTE — Addendum Note (Signed)
Addended by: Antonieta Iba on: 02/08/2021 01:55 PM   Modules accepted: Orders

## 2021-03-07 ENCOUNTER — Other Ambulatory Visit: Payer: Self-pay | Admitting: Cardiology

## 2021-03-18 DIAGNOSIS — E1169 Type 2 diabetes mellitus with other specified complication: Secondary | ICD-10-CM | POA: Diagnosis not present

## 2021-03-18 DIAGNOSIS — F324 Major depressive disorder, single episode, in partial remission: Secondary | ICD-10-CM | POA: Diagnosis not present

## 2021-03-18 DIAGNOSIS — I10 Essential (primary) hypertension: Secondary | ICD-10-CM | POA: Diagnosis not present

## 2021-03-18 DIAGNOSIS — E7800 Pure hypercholesterolemia, unspecified: Secondary | ICD-10-CM | POA: Diagnosis not present

## 2021-03-26 ENCOUNTER — Other Ambulatory Visit: Payer: Self-pay | Admitting: Neurology

## 2021-04-11 DIAGNOSIS — E7800 Pure hypercholesterolemia, unspecified: Secondary | ICD-10-CM | POA: Diagnosis not present

## 2021-04-11 DIAGNOSIS — I10 Essential (primary) hypertension: Secondary | ICD-10-CM | POA: Diagnosis not present

## 2021-04-11 DIAGNOSIS — E1169 Type 2 diabetes mellitus with other specified complication: Secondary | ICD-10-CM | POA: Diagnosis not present

## 2021-04-11 DIAGNOSIS — F324 Major depressive disorder, single episode, in partial remission: Secondary | ICD-10-CM | POA: Diagnosis not present

## 2021-05-02 DIAGNOSIS — E1169 Type 2 diabetes mellitus with other specified complication: Secondary | ICD-10-CM | POA: Diagnosis not present

## 2021-05-02 DIAGNOSIS — I10 Essential (primary) hypertension: Secondary | ICD-10-CM | POA: Diagnosis not present

## 2021-05-02 DIAGNOSIS — F324 Major depressive disorder, single episode, in partial remission: Secondary | ICD-10-CM | POA: Diagnosis not present

## 2021-05-02 DIAGNOSIS — E7800 Pure hypercholesterolemia, unspecified: Secondary | ICD-10-CM | POA: Diagnosis not present

## 2021-05-03 ENCOUNTER — Other Ambulatory Visit: Payer: Self-pay | Admitting: Neurology

## 2021-05-03 ENCOUNTER — Telehealth: Payer: Self-pay | Admitting: Neurology

## 2021-05-03 NOTE — Telephone Encounter (Signed)
The patient was last seen in 2019. He stopped primidone for a while but has restarted it with his home supply. His PCP has provided him with one 30-day prescription until he is seen here again. He is now scheduled on 05/12/21.

## 2021-05-03 NOTE — Telephone Encounter (Signed)
Pt request refill primidone (MYSOLINE) 50 MG tablet at [id number]

## 2021-05-12 ENCOUNTER — Encounter: Payer: Self-pay | Admitting: Neurology

## 2021-05-12 ENCOUNTER — Other Ambulatory Visit: Payer: Self-pay

## 2021-05-12 ENCOUNTER — Ambulatory Visit: Payer: Medicare HMO | Admitting: Neurology

## 2021-05-12 VITALS — BP 132/81 | HR 66 | Ht 70.0 in | Wt 213.0 lb

## 2021-05-12 DIAGNOSIS — I2510 Atherosclerotic heart disease of native coronary artery without angina pectoris: Secondary | ICD-10-CM

## 2021-05-12 DIAGNOSIS — G250 Essential tremor: Secondary | ICD-10-CM | POA: Diagnosis not present

## 2021-05-12 MED ORDER — PRIMIDONE 50 MG PO TABS: ORAL_TABLET | ORAL | 4 refills | Status: AC

## 2021-05-12 NOTE — Progress Notes (Signed)
Chief Complaint  Patient presents with   Follow-up    Room 16 - alone. Last seen 2019 for tremors. Restarted on primidone 50mg , one tab BID (PCP provided 30-day supply).       ASSESSMENT AND PLAN  [name] [name] is a 70 y.o. male   Essential tremor  He would like to continue on primidone 50 mg 3 tablets in the day,  May continue refilled by his primary care physician  Morning return to clinic for new issues  DIAGNOSTIC DATA (LABS, IMAGING, TESTING) - I reviewed patient records, labs, notes, testing and imaging myself where available.   HISTORICAL  [name] [name] is a 70 year old male, seen in request by his primary care physician Dr. Lona Kettle to continue follow-up on his essential tremor  I reviewed and summarized the referring note. PMHX  Gout Depression HTN HLD DM.  He was seen by our clinic since August 15th 2019 for evaluation of essential tremor, I also saw her in June 2021 for same issues.   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   Noted the onset bilateral hands tremor many years ago, involving both hands, most noticeable when he tried to use his left arm and hand such as using tools, screwdrivers, to the point of affecting his job performance,   He denied loss of smells, he also has past medical history of obesity, hypertension, diabetes, depression   He tried Inderal 80 mg 2 tablets every day, with some help   CT head without contrast showed no acute abnormality in Jan 2019   He was started on tramadol now taking 50 mg 2 tablets in the morning, 1 tab at night, which does help him, he wants to continue taking the medication  Hospital admission in March 2022 for inferior ST elevation MI, continue aspirin and Plavix, vascular risk factor of type 2 diabetes, hypertension, hyperlipidemia   PHYSICAL EXAM:   Vitals:   05/12/21  0909  BP: 132/81  Pulse: 66  Weight: 213 lb (96.6 kg)  Height: 5\' 10"  (1.778 m)   Not recorded     Body mass index is 30.56 kg/m.  PHYSICAL EXAMNIATION:  Gen: NAD, conversant, well nourised, well groomed                     Cardiovascular: Regular rate rhythm, no peripheral edema, warm, nontender. Eyes: Conjunctivae clear without exudates or hemorrhage Neck: Supple, no carotid bruits. Pulmonary: Clear to auscultation bilaterally   NEUROLOGICAL EXAM:  MENTAL STATUS: Speech:    Speech is normal; fluent and spontaneous with normal comprehension.  Cognition:     Orientation to time, place and person     Normal recent and remote memory     Normal Attention span and concentration     Normal Language, naming, repeating,spontaneous speech     Fund of knowledge   CRANIAL NERVES: CN II: Visual fields are full to confrontation. Pupils are round equal and briskly reactive to light. CN III, IV, VI: extraocular movement are normal. No ptosis. CN V: Facial sensation is intact to light touch CN VII: Face is symmetric with normal eye closure  CN VIII: Hearing is normal to causal conversation. CN IX, X: Phonation is normal. CN XI: Head turning and shoulder shrug are intact  MOTOR: There is no pronator drift of out-stretched arms. Muscle bulk and tone  are normal. Muscle strength is normal.  REFLEXES: Reflexes are 2+ and symmetric at the biceps, triceps, knees, and ankles. Plantar responses are flexor.  SENSORY: Intact to light touch, pinprick and vibratory sensation are intact in fingers and toes.  COORDINATION: There is no trunk or limb dysmetria noted.  GAIT/STANCE: Posture is normal. Gait is steady with normal steps, base, arm swing, and turning. Heel and toe walking are normal. Tandem gait is normal.  Romberg is absent.  REVIEW OF SYSTEMS:  Full 14 system review of systems performed and notable only for as above All other review of systems were  negative.   ALLERGIES: Allergies  Allergen Reactions   Codeine Other (See Comments)    Vertigo and upset stomach    HOME MEDICATIONS: Current Outpatient Medications  Medication Sig Dispense Refill   allopurinol (ZYLOPRIM) 300 MG tablet Take 1 tablet (300 mg total) by mouth daily. 30 tablet 6   aspirin 81 MG tablet Take 81 mg by mouth daily.     Blood Glucose Monitoring Suppl (FREESTYLE LITE) DEVI Please dispense continuous glucose monitoring system thank you 1 each 0   buPROPion (WELLBUTRIN XL) 300 MG 24 hr tablet Take 1 tablet by mouth daily.     carvedilol (COREG) 6.25 MG tablet Take 1 tablet (6.25 mg total) by mouth 2 (two) times daily with a meal. 120 tablet 3   Cholecalciferol (VITAMIN D) 125 MCG (5000 UT) CAPS Take 1 tablet by mouth daily.     clopidogrel (PLAVIX) 75 MG tablet Take 1 tablet (75 mg total) by mouth daily with breakfast. 90 tablet 3   Evolocumab (REPATHA SURECLICK) 062 MG/ML SOAJ Inject 140 mg into the skin every 14 (fourteen) days. 6 mL 3   fenofibrate (TRICOR) 145 MG tablet TAKE 1 TABLET BY MOUTH EVERY DAY 90 tablet 3   Insulin Glargine (LANTUS) 100 UNIT/ML Solostar Pen Inject 10 Units into the skin daily. (Patient taking differently: Inject 14 Units into the skin every morning.) 15 mL 1   isosorbide mononitrate (IMDUR) 30 MG 24 hr tablet Take 1 tablet (30 mg total) by mouth daily. 60 tablet 2   losartan-hydrochlorothiazide (HYZAAR) 100-12.5 MG tablet Take 1 tablet by mouth daily.     nitroGLYCERIN (NITROSTAT) 0.4 MG SL tablet Place 1 tablet (0.4 mg total) under the tongue every 5 (five) minutes as needed for chest pain. 20 tablet 6   primidone (MYSOLINE) 50 MG tablet Take 1 tablet (50 mg total) by mouth 3 (three) times daily. Please call [phone] to schedule appt. (Patient taking differently: Take 50-100 mg by mouth See admin instructions. Take 2 tablets (100 mg) by mouth every morning and 1 tablet (50 mg) at night. Please call [phone] to schedule appt.) 270 tablet 0    rosuvastatin (CRESTOR) 40 MG tablet Take 1 tablet (40 mg total) by mouth daily. 90 tablet 2   VITAMIN E PO Take 1 capsule by mouth daily.     No current facility-administered medications for this visit.    PAST MEDICAL HISTORY: Past Medical History:  Diagnosis Date   Coronary artery disease    s/p PCI of LAD w Residual 50-60% RCA; s/p inferior STEMI 01/28/2021 with 99% dRCA beyond the PDA, 50% mid and distal PCA, 70% pRCA, 40% pLAD prior to LAD stent on cath  s/p failed PTCA>>on medical therapy   Diabetes ([place])    Dilated aortic root (HCC)    35mm by echo 11/2019   Heart attack ([place])    High cholesterol  History of echocardiogram 06/2011   Normal LVF,mild LVH,trivial TR/MR, mild AS/AI   History of kidney stones    Hypertension    Mild aortic stenosis    mild by echo 11/2019   Tremor     PAST SURGICAL HISTORY: Past Surgical History:  Procedure Laterality Date   arm surgery Right    LEFT HEART CATH AND CORONARY ANGIOGRAPHY N/A 01/28/2021   Procedure: LEFT HEART CATH AND CORONARY ANGIOGRAPHY;  Surgeon: Belva Crome, MD;  Location: [facility];  Service: Cardiovascular;  Laterality: N/A;   stents - placement     x 2    FAMILY HISTORY: Family History  Problem Relation Age of Onset   Other Father        unsure of history   CAD Brother    Heart failure Mother    Emphysema Mother    CAD Mother     SOCIAL HISTORY: Social History   Socioeconomic History   Marital status: Married    Spouse name: Not on file   Number of children: 2   Years of education: 12   Highest education level: High school graduate  Occupational History   Occupation: own refrigeration business  Tobacco Use   Smoking status: Never   Smokeless tobacco: Never  Vaping Use   Vaping Use: Never used  Substance and Sexual Activity   Alcohol use: Yes    Comment: socially   Drug use: No   Sexual activity: Not on file  Other Topics Concern   Not on file  Social History Narrative   Lives  at home with his wife.   Left-handed.   1 cup caffeine per day.   Social Determinants of Health   Financial Resource Strain: Not on file  Food Insecurity: Not on file  Transportation Needs: Not on file  Physical Activity: Not on file  Stress: Not on file  Social Connections: Not on file  Intimate Partner Violence: Not on file      [name] [name], M.D. Ph.D.  [facility] [address], [place], [place] [address] Ph: [phone] Fax: [phone]  CC:  [name], MD [place],  [place] [address]  [name], MD

## 2021-05-16 ENCOUNTER — Encounter: Payer: Self-pay | Admitting: Neurology

## 2021-06-14 DIAGNOSIS — E1169 Type 2 diabetes mellitus with other specified complication: Secondary | ICD-10-CM | POA: Diagnosis not present

## 2021-06-14 DIAGNOSIS — F324 Major depressive disorder, single episode, in partial remission: Secondary | ICD-10-CM | POA: Diagnosis not present

## 2021-06-14 DIAGNOSIS — I10 Essential (primary) hypertension: Secondary | ICD-10-CM | POA: Diagnosis not present

## 2021-06-14 DIAGNOSIS — E7800 Pure hypercholesterolemia, unspecified: Secondary | ICD-10-CM | POA: Diagnosis not present

## 2021-06-21 DIAGNOSIS — H9391 Unspecified disorder of right ear: Secondary | ICD-10-CM | POA: Diagnosis not present

## 2021-06-27 ENCOUNTER — Ambulatory Visit: Payer: Medicare HMO | Admitting: Neurology

## 2021-06-30 ENCOUNTER — Other Ambulatory Visit: Payer: Self-pay | Admitting: Cardiology

## 2021-07-14 DIAGNOSIS — M109 Gout, unspecified: Secondary | ICD-10-CM | POA: Diagnosis not present

## 2021-07-14 DIAGNOSIS — Z23 Encounter for immunization: Secondary | ICD-10-CM | POA: Diagnosis not present

## 2021-07-14 DIAGNOSIS — Z0000 Encounter for general adult medical examination without abnormal findings: Secondary | ICD-10-CM | POA: Diagnosis not present

## 2021-07-14 DIAGNOSIS — E7800 Pure hypercholesterolemia, unspecified: Secondary | ICD-10-CM | POA: Diagnosis not present

## 2021-07-14 DIAGNOSIS — F324 Major depressive disorder, single episode, in partial remission: Secondary | ICD-10-CM | POA: Diagnosis not present

## 2021-07-14 DIAGNOSIS — R251 Tremor, unspecified: Secondary | ICD-10-CM | POA: Diagnosis not present

## 2021-07-14 DIAGNOSIS — Z794 Long term (current) use of insulin: Secondary | ICD-10-CM | POA: Diagnosis not present

## 2021-07-14 DIAGNOSIS — I10 Essential (primary) hypertension: Secondary | ICD-10-CM | POA: Diagnosis not present

## 2021-07-14 DIAGNOSIS — E1169 Type 2 diabetes mellitus with other specified complication: Secondary | ICD-10-CM | POA: Diagnosis not present

## 2021-07-14 DIAGNOSIS — Z125 Encounter for screening for malignant neoplasm of prostate: Secondary | ICD-10-CM | POA: Diagnosis not present

## 2021-07-18 DIAGNOSIS — E1169 Type 2 diabetes mellitus with other specified complication: Secondary | ICD-10-CM | POA: Diagnosis not present

## 2021-07-18 DIAGNOSIS — E7800 Pure hypercholesterolemia, unspecified: Secondary | ICD-10-CM | POA: Diagnosis not present

## 2021-07-18 DIAGNOSIS — I10 Essential (primary) hypertension: Secondary | ICD-10-CM | POA: Diagnosis not present

## 2021-07-18 DIAGNOSIS — I25110 Atherosclerotic heart disease of native coronary artery with unstable angina pectoris: Secondary | ICD-10-CM | POA: Diagnosis not present

## 2021-07-18 DIAGNOSIS — F324 Major depressive disorder, single episode, in partial remission: Secondary | ICD-10-CM | POA: Diagnosis not present

## 2021-08-02 DIAGNOSIS — H6122 Impacted cerumen, left ear: Secondary | ICD-10-CM | POA: Diagnosis not present

## 2021-08-02 DIAGNOSIS — Z87898 Personal history of other specified conditions: Secondary | ICD-10-CM | POA: Diagnosis not present

## 2021-08-02 DIAGNOSIS — Z8669 Personal history of other diseases of the nervous system and sense organs: Secondary | ICD-10-CM | POA: Diagnosis not present

## 2021-08-12 ENCOUNTER — Emergency Department (HOSPITAL_BASED_OUTPATIENT_CLINIC_OR_DEPARTMENT_OTHER)
Admission: EM | Admit: 2021-08-12 | Discharge: 2021-08-12 | Disposition: A | Discharge status: Home / Self Care | Payer: Medicare HMO | Attending: Emergency Medicine | Admitting: Emergency Medicine

## 2021-08-12 ENCOUNTER — Other Ambulatory Visit: Payer: Self-pay

## 2021-08-12 ENCOUNTER — Emergency Department (HOSPITAL_BASED_OUTPATIENT_CLINIC_OR_DEPARTMENT_OTHER): Payer: Medicare HMO

## 2021-08-12 ENCOUNTER — Encounter (HOSPITAL_BASED_OUTPATIENT_CLINIC_OR_DEPARTMENT_OTHER): Payer: Self-pay | Admitting: *Deleted

## 2021-08-12 DIAGNOSIS — E119 Type 2 diabetes mellitus without complications: Secondary | ICD-10-CM | POA: Insufficient documentation

## 2021-08-12 DIAGNOSIS — I2510 Atherosclerotic heart disease of native coronary artery without angina pectoris: Secondary | ICD-10-CM | POA: Diagnosis not present

## 2021-08-12 DIAGNOSIS — Z23 Encounter for immunization: Secondary | ICD-10-CM | POA: Diagnosis not present

## 2021-08-12 DIAGNOSIS — Z79899 Other long term (current) drug therapy: Secondary | ICD-10-CM | POA: Diagnosis not present

## 2021-08-12 DIAGNOSIS — S81811A Laceration without foreign body, right lower leg, initial encounter: Secondary | ICD-10-CM | POA: Diagnosis not present

## 2021-08-12 DIAGNOSIS — W268XXA Contact with other sharp object(s), not elsewhere classified, initial encounter: Secondary | ICD-10-CM | POA: Insufficient documentation

## 2021-08-12 DIAGNOSIS — Z794 Long term (current) use of insulin: Secondary | ICD-10-CM | POA: Diagnosis not present

## 2021-08-12 DIAGNOSIS — I10 Essential (primary) hypertension: Secondary | ICD-10-CM | POA: Insufficient documentation

## 2021-08-12 DIAGNOSIS — S8991XA Unspecified injury of right lower leg, initial encounter: Secondary | ICD-10-CM | POA: Diagnosis present

## 2021-08-12 DIAGNOSIS — T1490XA Injury, unspecified, initial encounter: Secondary | ICD-10-CM

## 2021-08-12 DIAGNOSIS — Z8616 Personal history of COVID-19: Secondary | ICD-10-CM | POA: Diagnosis not present

## 2021-08-12 DIAGNOSIS — S81011A Laceration without foreign body, right knee, initial encounter: Secondary | ICD-10-CM | POA: Diagnosis not present

## 2021-08-12 MED ORDER — TETANUS-DIPHTH-ACELL PERTUSSIS 5-2.5-18.5 LF-MCG/0.5 IM SUSY
0.5000 mL | PREFILLED_SYRINGE | Freq: Once | INTRAMUSCULAR | Status: AC
  Administered 2021-08-12: 0.5 mL via INTRAMUSCULAR

## 2021-08-12 NOTE — ED Notes (Signed)
This RN presented the AVS utilizing Teachback Method. Patient verbalizes understanding of Discharge Instructions. Opportunity for Questioning and Answers were provided. Patient Discharged from ED ambulatory to Home with Significant Other.

## 2021-08-12 NOTE — ED Triage Notes (Signed)
Right knee got cut by a chainsaw today.  Patient is on Plavix.

## 2021-08-12 NOTE — ED Notes (Signed)
XRAY at Bedside.

## 2021-08-12 NOTE — Discharge Instructions (Signed)
Keep the wound dry for 2 days.  After 2 days you may wash gently with soap and water with no prolonged soaking.  Keep the wound covered for the first week.  Return back to the ER if you see signs of infection, bleeding pain fevers or redness or any additional concerns. Return in 10 days for suture removal.

## 2021-08-12 NOTE — ED Provider Notes (Signed)
[facility] Provider Note   CSN: [id number] Arrival date & time: 08/12/21  1854     History Chief Complaint  Patient presents with   Laceration    [name] [name] is a 70 y.o. male.  Patient presents with chief complaint of laceration to the right knee.  He is on Plavix, was using a chainsaw when it slipped and he cut his right knee today.  Unknown last tetanus.  Denies fall or trauma elsewhere.  No reports of fevers cough vomiting or diarrhea.      Past Medical History:  Diagnosis Date   Coronary artery disease    s/p PCI of LAD w Residual 50-60% RCA; s/p inferior STEMI 01/28/2021 with 99% dRCA beyond the PDA, 50% mid and distal PCA, 70% pRCA, 40% pLAD prior to LAD stent on cath  s/p failed PTCA>>on medical therapy   Diabetes ([place])    Dilated aortic root (HCC)    33mm by echo 11/2019   Heart attack ([place])    High cholesterol    History of echocardiogram 06/2011   Normal LVF,mild LVH,trivial TR/MR, mild AS/AI   History of kidney stones    Hypertension    Mild aortic stenosis    mild by echo 11/2019   Tremor     Patient Active Problem List   Diagnosis Date Noted   Coronary artery disease without angina pectoris 05/12/2021   DM (diabetes mellitus) ([place]) 01/31/2021   Acute ST elevation myocardial infarction (STEMI) of inferior wall ([place]) 01/28/2021   COVID-19 virus infection 06/10/2019   Chronic diarrhea 06/09/2019   Viral pneumonia 06/07/2019   Essential tremor 07/04/2018   Obstructive sleep apnea 07/04/2018   Dilated aortic root (HCC)    Tremor 09/27/2017   Ascending aorta dilatation (HCC) 02/16/2017   Mild aortic stenosis    Coronary artery disease involving native coronary artery of native heart with unstable angina pectoris ([place]) 12/30/2013   Essential hypertension 12/30/2013   Hyperlipidemia associated with type 2 diabetes mellitus ([place]) 12/30/2013   Encounter for long-term (current) use of other medications 12/30/2013    Past  Surgical History:  Procedure Laterality Date   arm surgery Right    LEFT HEART CATH AND CORONARY ANGIOGRAPHY N/A 01/28/2021   Procedure: LEFT HEART CATH AND CORONARY ANGIOGRAPHY;  Surgeon: Belva Crome, MD;  Location: [facility];  Service: Cardiovascular;  Laterality: N/A;   stents - placement     x 2       Family History  Problem Relation Age of Onset   Other Father        unsure of history   CAD Brother    Heart failure Mother    Emphysema Mother    CAD Mother     Social History   Tobacco Use   Smoking status: Never   Smokeless tobacco: Never  Vaping Use   Vaping Use: Never used  Substance Use Topics   Alcohol use: Yes    Comment: socially   Drug use: No    Home Medications Prior to Admission medications   Medication Sig Start Date End Date Taking? Authorizing Provider  allopurinol (ZYLOPRIM) 300 MG tablet Take 1 tablet (300 mg total) by mouth daily. 03/10/15  Yes Sueanne Margarita, MD  aspirin 81 MG tablet Take 81 mg by mouth daily.   Yes [provider]  buPROPion (WELLBUTRIN XL) 300 MG 24 hr tablet Take 1 tablet by mouth daily.   Yes [provider]  carvedilol (  COREG) 6.25 MG tablet Take 1 tablet (6.25 mg total) by mouth 2 (two) times daily with a meal. 01/31/21  Yes Kathyrn Drown D, NP  Cholecalciferol (VITAMIN D) 125 MCG (5000 UT) CAPS Take 1 tablet by mouth daily.   Yes [provider]  clopidogrel (PLAVIX) 75 MG tablet Take 1 tablet (75 mg total) by mouth daily with breakfast. 02/01/21  Yes Kathyrn Drown D, NP  fenofibrate (TRICOR) 145 MG tablet TAKE 1 TABLET BY MOUTH EVERY DAY 03/09/21  Yes Turner, Eber Gearline Spilman, MD  Insulin Glargine (LANTUS) 100 UNIT/ML Solostar Pen Inject 10 Units into the skin daily. Patient taking differently: Inject 14 Units into the skin every morning. 06/12/19  Yes Gherghe, Vella Redhead, MD  isosorbide mononitrate (IMDUR) 30 MG 24 hr tablet TAKE 1 TABLET BY MOUTH EVERY DAY 06/30/21  Yes Turner, Eber Amy Gothard, MD   losartan-hydrochlorothiazide (HYZAAR) 100-12.5 MG tablet Take 1 tablet by mouth daily. 03/03/21  Yes [provider]  primidone (MYSOLINE) 50 MG tablet 2 tabs in the morning 1 tab at night 05/12/21  Yes Marcial Pacas, MD  rosuvastatin (CRESTOR) 40 MG tablet Take 1 tablet (40 mg total) by mouth daily. 02/01/21  Yes Kathyrn Drown D, NP  VITAMIN E PO Take 1 capsule by mouth daily.   Yes [provider]  Blood Glucose Monitoring Suppl (FREESTYLE LITE) DEVI Please dispense continuous glucose monitoring system thank you 06/12/19   Caren Griffins, MD  Evolocumab (REPATHA SURECLICK) 376 MG/ML SOAJ Inject 140 mg into the skin every 14 (fourteen) days. 02/08/21   Sueanne Margarita, MD  nitroGLYCERIN (NITROSTAT) 0.4 MG SL tablet Place 1 tablet (0.4 mg total) under the tongue every 5 (five) minutes as needed for chest pain. 02/08/21   Sueanne Margarita, MD    Allergies    Codeine  Review of Systems   Review of Systems  Constitutional:  Negative for fever.  HENT:  Negative for ear pain and sore throat.   Eyes:  Negative for pain.  Respiratory:  Negative for cough.   Cardiovascular:  Negative for chest pain.  Gastrointestinal:  Negative for abdominal pain.  Genitourinary:  Negative for flank pain.  Musculoskeletal:  Negative for back pain.  Skin:  Negative for color change and rash.  Neurological:  Negative for syncope.  All other systems reviewed and are negative.  Physical Exam Updated Vital Signs BP (!) 141/95   Pulse 81   Temp 98.3 F (36.8 C)   Resp 18   Ht 5\' 9"  (1.753 m)   Wt 94.8 kg   SpO2 97%   BMI 30.86 kg/m   Physical Exam Constitutional:      Appearance: He is well-developed.  HENT:     Head: Normocephalic.     Nose: Nose normal.  Eyes:     Extraocular Movements: Extraocular movements intact.  Cardiovascular:     Rate and Rhythm: Normal rate.  Pulmonary:     Effort: Pulmonary effort is normal.  Musculoskeletal:     Comments: Laceration appears superficial,  does not involve the joint.  Patient is neurovascularly intact no weakness of flexion extension of the knee noted.  Skin:    Coloration: Skin is not jaundiced.     Comments: Right suprapatellar laceration approximately 3 cm in length.  Slow persistent bleeding noted.  Neurological:     Mental Status: He is alert. Mental status is at baseline.    ED Results / Procedures / Treatments   Labs (all labs ordered are listed,  but only abnormal results are displayed) Labs Reviewed - No data to display  EKG None  Radiology DG Knee Right Port  Result Date: 08/12/2021 CLINICAL DATA:  Right knee laceration. EXAM: PORTABLE RIGHT KNEE - 1-2 VIEW COMPARISON:  None. FINDINGS: No evidence of fracture, dislocation, or joint effusion. Mild medial tibiofemoral compartment space narrowing is seen. An ill-defined soft tissue laceration is seen along the anterior aspect of the right knee, above the right patella. Overlying mildly radiopaque gauze is present. IMPRESSION: 1. Anterior tissue laceration without evidence of an acute osseous abnormality. Electronically Signed   By: Virgina Norfolk M.D.   On: 08/12/2021 20:29    Procedures .Marland KitchenLaceration Repair  Date/Time: 08/12/2021 8:35 PM Performed by: Luna Fuse, MD Authorized by: Luna Fuse, MD   Comments:     Lidocaine 1% with epinephrine injected total of 2 cc injected with good hemostasis achieved.  Wound thoroughly irrigated with sterile water under pressure 500 cc.  No foreign body noted in bloodless field.  Wound edges approximated with 3-0 sutures.  Total of 3 sutures placed.   Medications Ordered in ED Medications  Tdap (BOOSTRIX) injection 0.5 mL (0.5 mLs Intramuscular Given 08/12/21 1930)    ED Course  I have reviewed the triage vital signs and the nursing notes.  Pertinent labs & imaging results that were available during my care of the patient were reviewed by me and considered in my medical decision making (see chart for details).     MDM Rules/Calculators/A&P                           X-rays of the right knee unremarkable for acute bony abnormality or foreign body.  Patient tetanus updated here today.  Lidocaine 1% with epinephrine injected total of 2 cc injected with good hemostasis achieved.  Wound thoroughly irrigated with sterile water under pressure 500 cc.  No foreign body noted in bloodless field.  Wound edges approximated with 3-0 sutures.  Total of 3 sutures placed.  Final Clinical Impression(s) / ED Diagnoses Final diagnoses:  Laceration of right lower extremity, initial encounter    Rx / DC Orders ED Discharge Orders     None        Luna Fuse, MD 08/12/21 2036

## 2021-08-16 DIAGNOSIS — E1169 Type 2 diabetes mellitus with other specified complication: Secondary | ICD-10-CM | POA: Diagnosis not present

## 2021-08-16 DIAGNOSIS — F324 Major depressive disorder, single episode, in partial remission: Secondary | ICD-10-CM | POA: Diagnosis not present

## 2021-08-16 DIAGNOSIS — E7800 Pure hypercholesterolemia, unspecified: Secondary | ICD-10-CM | POA: Diagnosis not present

## 2021-08-16 DIAGNOSIS — I25110 Atherosclerotic heart disease of native coronary artery with unstable angina pectoris: Secondary | ICD-10-CM | POA: Diagnosis not present

## 2021-08-16 DIAGNOSIS — I10 Essential (primary) hypertension: Secondary | ICD-10-CM | POA: Diagnosis not present

## 2021-08-22 DIAGNOSIS — H903 Sensorineural hearing loss, bilateral: Secondary | ICD-10-CM | POA: Diagnosis not present

## 2021-09-12 DIAGNOSIS — I10 Essential (primary) hypertension: Secondary | ICD-10-CM | POA: Diagnosis not present

## 2021-09-12 DIAGNOSIS — I25110 Atherosclerotic heart disease of native coronary artery with unstable angina pectoris: Secondary | ICD-10-CM | POA: Diagnosis not present

## 2021-09-12 DIAGNOSIS — E1169 Type 2 diabetes mellitus with other specified complication: Secondary | ICD-10-CM | POA: Diagnosis not present

## 2021-09-12 DIAGNOSIS — E7800 Pure hypercholesterolemia, unspecified: Secondary | ICD-10-CM | POA: Diagnosis not present

## 2021-09-12 DIAGNOSIS — F324 Major depressive disorder, single episode, in partial remission: Secondary | ICD-10-CM | POA: Diagnosis not present

## 2021-11-06 ENCOUNTER — Other Ambulatory Visit: Payer: Self-pay | Admitting: Cardiology

## 2022-01-17 DIAGNOSIS — E7800 Pure hypercholesterolemia, unspecified: Secondary | ICD-10-CM | POA: Diagnosis not present

## 2022-01-17 DIAGNOSIS — Z23 Encounter for immunization: Secondary | ICD-10-CM | POA: Diagnosis not present

## 2022-01-17 DIAGNOSIS — I10 Essential (primary) hypertension: Secondary | ICD-10-CM | POA: Diagnosis not present

## 2022-01-17 DIAGNOSIS — H9319 Tinnitus, unspecified ear: Secondary | ICD-10-CM | POA: Diagnosis not present

## 2022-01-17 DIAGNOSIS — E119 Type 2 diabetes mellitus without complications: Secondary | ICD-10-CM | POA: Diagnosis not present

## 2022-01-17 DIAGNOSIS — E1169 Type 2 diabetes mellitus with other specified complication: Secondary | ICD-10-CM | POA: Diagnosis not present

## 2022-01-26 ENCOUNTER — Other Ambulatory Visit: Payer: Self-pay | Admitting: Cardiology

## 2022-02-01 ENCOUNTER — Other Ambulatory Visit: Payer: Self-pay | Admitting: Cardiology

## 2022-02-15 ENCOUNTER — Other Ambulatory Visit: Payer: Self-pay | Admitting: Cardiology

## 2022-03-27 ENCOUNTER — Other Ambulatory Visit: Payer: Self-pay | Admitting: Cardiology

## 2022-03-30 DIAGNOSIS — I10 Essential (primary) hypertension: Secondary | ICD-10-CM | POA: Diagnosis not present

## 2022-03-30 DIAGNOSIS — E1169 Type 2 diabetes mellitus with other specified complication: Secondary | ICD-10-CM | POA: Diagnosis not present

## 2022-03-30 DIAGNOSIS — E7800 Pure hypercholesterolemia, unspecified: Secondary | ICD-10-CM | POA: Diagnosis not present

## 2022-03-30 DIAGNOSIS — I25110 Atherosclerotic heart disease of native coronary artery with unstable angina pectoris: Secondary | ICD-10-CM | POA: Diagnosis not present

## 2022-03-30 DIAGNOSIS — F324 Major depressive disorder, single episode, in partial remission: Secondary | ICD-10-CM | POA: Diagnosis not present

## 2022-04-12 DIAGNOSIS — Z6831 Body mass index (BMI) 31.0-31.9, adult: Secondary | ICD-10-CM | POA: Diagnosis not present

## 2022-04-12 DIAGNOSIS — E669 Obesity, unspecified: Secondary | ICD-10-CM | POA: Diagnosis not present

## 2022-04-12 DIAGNOSIS — E1169 Type 2 diabetes mellitus with other specified complication: Secondary | ICD-10-CM | POA: Diagnosis not present

## 2022-05-11 DIAGNOSIS — M25511 Pain in right shoulder: Secondary | ICD-10-CM | POA: Diagnosis not present

## 2022-06-20 ENCOUNTER — Other Ambulatory Visit: Payer: Self-pay | Admitting: Cardiology

## 2022-07-05 DIAGNOSIS — F324 Major depressive disorder, single episode, in partial remission: Secondary | ICD-10-CM | POA: Diagnosis not present

## 2022-07-05 DIAGNOSIS — E1169 Type 2 diabetes mellitus with other specified complication: Secondary | ICD-10-CM | POA: Diagnosis not present

## 2022-07-05 DIAGNOSIS — I25110 Atherosclerotic heart disease of native coronary artery with unstable angina pectoris: Secondary | ICD-10-CM | POA: Diagnosis not present

## 2022-07-05 DIAGNOSIS — I10 Essential (primary) hypertension: Secondary | ICD-10-CM | POA: Diagnosis not present

## 2022-07-19 DIAGNOSIS — Z125 Encounter for screening for malignant neoplasm of prostate: Secondary | ICD-10-CM | POA: Diagnosis not present

## 2022-07-19 DIAGNOSIS — E7800 Pure hypercholesterolemia, unspecified: Secondary | ICD-10-CM | POA: Diagnosis not present

## 2022-07-19 DIAGNOSIS — E1169 Type 2 diabetes mellitus with other specified complication: Secondary | ICD-10-CM | POA: Diagnosis not present

## 2022-07-19 DIAGNOSIS — M109 Gout, unspecified: Secondary | ICD-10-CM | POA: Diagnosis not present

## 2022-07-19 DIAGNOSIS — I10 Essential (primary) hypertension: Secondary | ICD-10-CM | POA: Diagnosis not present

## 2022-07-27 DIAGNOSIS — E668 Other obesity: Secondary | ICD-10-CM | POA: Diagnosis not present

## 2022-07-27 DIAGNOSIS — Z0000 Encounter for general adult medical examination without abnormal findings: Secondary | ICD-10-CM | POA: Diagnosis not present

## 2022-07-27 DIAGNOSIS — Z125 Encounter for screening for malignant neoplasm of prostate: Secondary | ICD-10-CM | POA: Diagnosis not present

## 2022-07-27 DIAGNOSIS — E7800 Pure hypercholesterolemia, unspecified: Secondary | ICD-10-CM | POA: Diagnosis not present

## 2022-07-27 DIAGNOSIS — E1169 Type 2 diabetes mellitus with other specified complication: Secondary | ICD-10-CM | POA: Diagnosis not present

## 2022-07-27 DIAGNOSIS — I10 Essential (primary) hypertension: Secondary | ICD-10-CM | POA: Diagnosis not present

## 2022-07-27 DIAGNOSIS — M109 Gout, unspecified: Secondary | ICD-10-CM | POA: Diagnosis not present

## 2022-07-27 DIAGNOSIS — Z23 Encounter for immunization: Secondary | ICD-10-CM | POA: Diagnosis not present

## 2022-07-27 DIAGNOSIS — F324 Major depressive disorder, single episode, in partial remission: Secondary | ICD-10-CM | POA: Diagnosis not present

## 2022-08-07 ENCOUNTER — Encounter: Payer: Self-pay | Admitting: Podiatry

## 2022-08-07 ENCOUNTER — Ambulatory Visit: Payer: Medicare HMO | Admitting: Podiatry

## 2022-08-07 DIAGNOSIS — E1165 Type 2 diabetes mellitus with hyperglycemia: Secondary | ICD-10-CM

## 2022-08-07 DIAGNOSIS — B351 Tinea unguium: Secondary | ICD-10-CM

## 2022-08-07 DIAGNOSIS — E1142 Type 2 diabetes mellitus with diabetic polyneuropathy: Secondary | ICD-10-CM

## 2022-08-07 DIAGNOSIS — M79675 Pain in left toe(s): Secondary | ICD-10-CM | POA: Diagnosis not present

## 2022-08-07 DIAGNOSIS — M79674 Pain in right toe(s): Secondary | ICD-10-CM | POA: Diagnosis not present

## 2022-08-07 NOTE — Progress Notes (Signed)
  Subjective:  Patient ID: [name] [name], male    DOB: 05/24/1951,   MRN: [id number]  No chief complaint on file.   71 y.o. male presents for concern of thickened elongated and painful nails that are difficult to trim. Requesting to have them trimmed today. Relates burning and tingling in their feet. Patient is diabetic and last A1c was  Lab Results  Component Value Date   HGBA1C 9.6 (H) 01/28/2021   .   PCP:  [name], MD    . Denies any other pedal complaints. Denies n/v/f/c.   Past Medical History:  Diagnosis Date   Coronary artery disease    s/p PCI of LAD w Residual 50-60% RCA; s/p inferior STEMI 01/28/2021 with 99% dRCA beyond the PDA, 50% mid and distal PCA, 70% pRCA, 40% pLAD prior to LAD stent on cath  s/p failed PTCA>>on medical therapy   Diabetes ([place])    Dilated aortic root (HCC)    62m by echo 11/2019   Heart attack ([place]    High cholesterol    History of echocardiogram 06/2011   Normal LVF,mild LVH,trivial TR/MR, mild AS/AI   History of kidney stones    Hypertension    Mild aortic stenosis    mild by echo 11/2019   Tremor     Objective:  Physical Exam: Vascular: DP/PT pulses 2/4 bilateral. CFT <3 seconds. Absent hair growth on digits. Edema noted to bilateral lower extremities. Xerosis noted bilaterally.  Skin. No lacerations or abrasions bilateral feet. Nails 1-5 bilateral  are thickened discolored and elongated with subungual debris.  Musculoskeletal: MMT 5/5 bilateral lower extremities in DF, PF, Inversion and Eversion. Deceased ROM in DF of ankle joint.  Neurological: Sensation intact to light touch. Protective sensation diminished bilateral.    Assessment:   1. Pain due to onychomycosis of toenails of both feet   2. Uncontrolled type 2 diabetes mellitus with hyperglycemia ([place]   3. Diabetic peripheral neuropathy ([place]      Plan:  Patient was evaluated and treated and all questions answered. -Discussed and educated patient on diabetic foot  care, especially with  regards to the vascular, neurological and musculoskeletal systems.  -Stressed the importance of good glycemic control and the detriment of not  controlling glucose levels in relation to the foot. -Discussed supportive shoes at all times and checking feet regularly.  -Mechanically debrided all nails 1-5 bilateral using sterile nail nipper and filed with dremel without incident  -Answered all patient questions -Patient to return  in 3 months for at risk foot care -Patient advised to call the office if any problems or questions arise in the meantime.   RLorenda Peck DPM

## 2022-10-18 DIAGNOSIS — E1140 Type 2 diabetes mellitus with diabetic neuropathy, unspecified: Secondary | ICD-10-CM | POA: Diagnosis not present

## 2022-10-18 DIAGNOSIS — E1169 Type 2 diabetes mellitus with other specified complication: Secondary | ICD-10-CM | POA: Diagnosis not present

## 2022-10-18 DIAGNOSIS — Z6832 Body mass index (BMI) 32.0-32.9, adult: Secondary | ICD-10-CM | POA: Diagnosis not present

## 2022-11-09 ENCOUNTER — Ambulatory Visit: Payer: Medicare HMO | Admitting: Podiatry

## 2022-11-09 DIAGNOSIS — M19072 Primary osteoarthritis, left ankle and foot: Secondary | ICD-10-CM | POA: Diagnosis not present

## 2022-11-09 DIAGNOSIS — M79675 Pain in left toe(s): Secondary | ICD-10-CM | POA: Diagnosis not present

## 2022-11-09 DIAGNOSIS — M79674 Pain in right toe(s): Secondary | ICD-10-CM

## 2022-11-09 DIAGNOSIS — M19079 Primary osteoarthritis, unspecified ankle and foot: Secondary | ICD-10-CM

## 2022-11-09 DIAGNOSIS — B351 Tinea unguium: Secondary | ICD-10-CM

## 2022-11-09 DIAGNOSIS — E1142 Type 2 diabetes mellitus with diabetic polyneuropathy: Secondary | ICD-10-CM

## 2022-11-09 DIAGNOSIS — E1165 Type 2 diabetes mellitus with hyperglycemia: Secondary | ICD-10-CM | POA: Diagnosis not present

## 2022-11-09 MED ORDER — GABAPENTIN 300 MG PO CAPS: 300.0000 mg | ORAL_CAPSULE | Freq: Every day | ORAL | 2 refills | Status: DC

## 2022-11-09 NOTE — Progress Notes (Signed)
  Subjective:  Patient ID: [name] [name], male    DOB: 1950-12-13,  MRN: [id number]  Chief Complaint  Patient presents with   Follow-up    Pain due to onychomycosis of toenails of both feet, patient express interest in new inserts       71 y.o. male presents with the above complaint. History confirmed with patient. Patient presenting with pain related to dystrophic thickened elongated nails. Patient is unable to trim own nails related to nail dystrophy and/or mobility issues. Patient does have a history of T2DM.  Patient also reports burning shooting pains especially at night when he is trying to go to bed.  He does not take any medication for this at this time.  Objective:  Physical Exam: warm, good capillary refill nail exam onychomycosis of the toenails, onycholysis, and dystrophic nails DP pulses palpable, PT pulses palpable, and protective sensation intact subjective sensation of burning pins-and-needles especially at night bilateral foot Left Foot:  Pain with palpation of nails due to elongation and dystrophic growth.  Osseous spurring noted across the tarsometatarsal joints Right Foot: Pain with palpation of nails due to elongation and dystrophic growth. Osseous spurring noted across the tarsometatarsal joints  Assessment:   1. Pain due to onychomycosis of toenails of both feet   2. Arthritis of midfoot   3. Diabetic peripheral neuropathy ([place])   4. Uncontrolled type 2 diabetes mellitus with hyperglycemia ([place])      Plan:  Patient was evaluated and treated and all questions answered.  # Diabetic peripheral neuropathy with neuropathic pain -Discussed with the patient that his symptoms do sound like he is having diabetic peripheral neuropathy pain. -Recommend trial of gabapentin 300 mg once at night daily. -He will continue this until follow-up in the next visit. -Discussed risk of side effects including with tiredness he should be careful about driving or taking medication  until he knows infection.  # Arthritis of midfoot bilaterally -Recommend treatment with power step orthotics as patient is requesting inserts for shoes at this visit. -Dispensed 1 pair of power step inserts for patient. -Will consider diabetic inserts and shoes at next visit 1 he should qualify for these.  #Onychomycosis with pain  -Nails palliatively debrided as below. -Educated on self-care  Procedure: Nail Debridement Rationale: Pain Type of Debridement: manual, sharp debridement. Instrumentation: Nail nipper, rotary burr. Number of Nails: 10   Return in about 3 months (around 02/08/2023) for [facility].         Everitt Amber, DPM [facility]

## 2022-11-28 ENCOUNTER — Other Ambulatory Visit (HOSPITAL_COMMUNITY): Payer: Self-pay

## 2022-12-01 ENCOUNTER — Other Ambulatory Visit (HOSPITAL_COMMUNITY): Payer: Self-pay

## 2022-12-08 ENCOUNTER — Encounter (HOSPITAL_BASED_OUTPATIENT_CLINIC_OR_DEPARTMENT_OTHER): Payer: Self-pay | Admitting: *Deleted

## 2022-12-08 ENCOUNTER — Other Ambulatory Visit: Payer: Self-pay

## 2022-12-08 ENCOUNTER — Emergency Department (HOSPITAL_BASED_OUTPATIENT_CLINIC_OR_DEPARTMENT_OTHER)
Admission: EM | Admit: 2022-12-08 | Discharge: 2022-12-08 | Disposition: A | Discharge status: Home / Self Care | Payer: Medicare HMO | Attending: Emergency Medicine | Admitting: Emergency Medicine

## 2022-12-08 DIAGNOSIS — Z794 Long term (current) use of insulin: Secondary | ICD-10-CM | POA: Diagnosis not present

## 2022-12-08 DIAGNOSIS — I10 Essential (primary) hypertension: Secondary | ICD-10-CM | POA: Diagnosis not present

## 2022-12-08 DIAGNOSIS — Z7982 Long term (current) use of aspirin: Secondary | ICD-10-CM | POA: Insufficient documentation

## 2022-12-08 DIAGNOSIS — L0291 Cutaneous abscess, unspecified: Secondary | ICD-10-CM

## 2022-12-08 DIAGNOSIS — Z79899 Other long term (current) drug therapy: Secondary | ICD-10-CM | POA: Insufficient documentation

## 2022-12-08 DIAGNOSIS — L0211 Cutaneous abscess of neck: Secondary | ICD-10-CM | POA: Diagnosis not present

## 2022-12-08 DIAGNOSIS — I2510 Atherosclerotic heart disease of native coronary artery without angina pectoris: Secondary | ICD-10-CM | POA: Diagnosis not present

## 2022-12-08 DIAGNOSIS — E119 Type 2 diabetes mellitus without complications: Secondary | ICD-10-CM | POA: Insufficient documentation

## 2022-12-08 MED ORDER — SULFAMETHOXAZOLE-TRIMETHOPRIM 800-160 MG PO TABS: 1.0000 | ORAL_TABLET | Freq: Two times a day (BID) | ORAL | 0 refills | Status: AC

## 2022-12-08 NOTE — ED Triage Notes (Addendum)
Here by POV for L lateral neck lump c/w abscess. Onset 4 days ago. Gradually progressively worse. Denies fever, drainage. States, "wife tried to lance with pin and squeezed on Wednesday night". Applied neosporin and band-aid. Lump associated with redness, swelling, heat, and TTP.  H/o DM.  States, "Does not check his blood sugar. Blood sugar is always high, so I stopped checking. Took my lantus this am".

## 2022-12-08 NOTE — Discharge Instructions (Signed)
You were seen in the emergency department for a skin abscess to your left neck.  I am placing you on an antibiotic that you will take 2 times a day over the next 7 days.  Please return should you have worsening of the redness or swelling or begin to have fevers or significantly worsening pain..  The antibiotics may resolve this area.  It may also drain on its own.  Please also use warm compresses over the area which will promote it to drain.

## 2022-12-08 NOTE — ED Notes (Signed)
Discharge instructions, prescriptions, and follow up care reviewed and explained. Pt verbalized understanding and had no further questions on d/c. Pt caox4 and ambulatory on d/c.

## 2022-12-08 NOTE — ED Provider Notes (Addendum)
[facility] DE[facility] Provider Note   CSN: [id number] Arrival date & time: 12/08/22  0815     History  Chief Complaint  Patient presents with   Abscess    [name] [name] is a 72 y.o. male.  With past medical history of diabetes, CAD, hyperlipidemia, hypertension who presents to the emergency department with complaint of abscess.  States he noticed a bump about 4 days ago on the left neck.  States that over the past 4 days it is slowly grown and and slightly tender.  He states that 2 days ago his wife attempted to lance the area but they only obtained bloody drainage.  He does shave his left neck but is unsure if he reaches that far back.  He states he has had no purulent drainage from the area.  He denies having any fever or chills or nausea.  He does have a history of diabetes but no previous abscesses before.   Abscess      Home Medications Prior to Admission medications   Medication Sig Start Date End Date Taking? Authorizing Provider  sulfamethoxazole-trimethoprim (BACTRIM DS) 800-160 MG tablet Take 1 tablet by mouth 2 (two) times daily for 7 days. 12/08/22 12/15/22 Yes Mickie Hillier, PA-C  allopurinol (ZYLOPRIM) 300 MG tablet Take 1 tablet (300 mg total) by mouth daily. 03/10/15   Sueanne Margarita, MD  aspirin 81 MG tablet Take 81 mg by mouth daily.    [provider]  Blood Glucose Monitoring Suppl (FREESTYLE LITE) DEVI Please dispense continuous glucose monitoring system thank you 06/12/19   Caren Griffins, MD  buPROPion (WELLBUTRIN XL) 300 MG 24 hr tablet Take 1 tablet by mouth daily.    [provider]  carvedilol (COREG) 6.25 MG tablet Take 1 tablet (6.25 mg total) by mouth 2 (two) times daily with a meal. 01/31/21   Kathyrn Drown D, NP  Cholecalciferol (VITAMIN D) 125 MCG (5000 UT) CAPS Take 1 tablet by mouth daily.    [provider]  clopidogrel (PLAVIX) 75 MG tablet Take 1 tablet (75 mg total) by mouth daily with breakfast.  02/01/21   Tommie Raymond, NP  Evolocumab (REPATHA SURECLICK) 629 MG/ML SOAJ INJECT 140 MG INTO THE SKIN EVERY 14 (FOURTEEN) DAYS. 01/26/22   Sueanne Margarita, MD  fenofibrate (TRICOR) 145 MG tablet TAKE 1 TABLET BY MOUTH EVERY DAY 03/09/21   Sueanne Margarita, MD  gabapentin (NEURONTIN) 300 MG capsule Take 1 capsule (300 mg total) by mouth daily after supper. 11/09/22 02/07/23  Standiford, Nena Alexander, DPM  Insulin Glargine (LANTUS) 100 UNIT/ML Solostar Pen Inject 10 Units into the skin daily. Patient taking differently: Inject 14 Units into the skin every morning. 06/12/19   Caren Griffins, MD  isosorbide mononitrate (IMDUR) 30 MG 24 hr tablet Take 1 tablet (30 mg total) by mouth daily. Pt needs to call and make appt with provider for further refills - 1st attempt 03/27/22   Sueanne Margarita, MD  losartan-hydrochlorothiazide (HYZAAR) 100-12.5 MG tablet Take 1 tablet by mouth daily. 03/03/21   [provider]  nitroGLYCERIN (NITROSTAT) 0.4 MG SL tablet Place 1 tablet (0.4 mg total) under the tongue every 5 (five) minutes as needed for chest pain. 02/08/21   Sueanne Margarita, MD  primidone (MYSOLINE) 50 MG tablet 2 tabs in the morning 1 tab at night 05/12/21   Marcial Pacas, MD  rosuvastatin (CRESTOR) 40 MG tablet Take 1 tablet (40 mg total) by mouth daily. Pt  needs to call and make appt with provider for further refills - 1st attempt 03/27/22   Sueanne Margarita, MD  VITAMIN E PO Take 1 capsule by mouth daily.    [provider]      Allergies    Codeine    Review of Systems   Review of Systems  Skin:  Positive for wound.  All other systems reviewed and are negative.   Physical Exam Updated Vital Signs BP (!) 168/94 (BP Location: Right Arm)   Pulse 81   Temp 98.3 F (36.8 C)   Resp 18   Ht '5\' 10"'$  (1.778 m)   Wt 106.6 kg   SpO2 95%   BMI 33.72 kg/m  Physical Exam Vitals and nursing note reviewed.  Constitutional:      General: He is not in acute distress.    Appearance: Normal  appearance. He is not ill-appearing or toxic-appearing.  HENT:     Head: Normocephalic and atraumatic.  Eyes:     General: No scleral icterus. Pulmonary:     Effort: Pulmonary effort is normal. No respiratory distress.  Musculoskeletal:     Cervical back: Normal range of motion and neck supple. No rigidity.  Skin:    Findings: No rash.     Comments: To the left lateral neck there is about a 1 cm area of redness and swelling.  No focal head to the redness.  Induration without fluctuance.  Mildly tender to palpation.  No surrounding cellulitis.  Neurological:     General: No focal deficit present.     Mental Status: He is alert.  Psychiatric:        Mood and Affect: Mood normal.        Behavior: Behavior normal.        Thought Content: Thought content normal.        Judgment: Judgment normal.     ED Results / Procedures / Treatments   Labs (all labs ordered are listed, but only abnormal results are displayed) Labs Reviewed - No data to display   EKG None  Radiology No results found.  Procedures Procedures    Medications Ordered in ED Medications - No data to display  ED Course/ Medical Decision Making/ A&P   {                            Medical Decision Making Risk Prescription drug management.  Initial Impression and Ddx 72 year old male who presents to the emergency department with complaint of abscess. Patient PMH that increases complexity of ED encounter: Diabetes, CAD, hyperlipidemia, hypertension  Interpretation of Diagnostics I independent reviewed and interpreted the labs as followed: Not indicated  - I independently visualized the following imaging with scope of interpretation limited to determining acute life threatening conditions related to emergency care: Not indicated  Patient Reassessment and Ultimate Disposition/Management On my exam there is a 1 cm area of redness and swelling.  May be early abscess.  There is no focal head to the abscess.   There is no surrounding cellulitis.  There is no fluctuation on palpation of the area but it is mildly tender.  Given the location, as well as lack of fluctuance, focal point will defer I&D at this time. He is non septic appearing. No systemic symptoms.  Will place him on Bactrim twice daily over the next week to see if this resolves on its own.  Given return precautions for worsening symptoms.  The patient has been appropriately medically screened and/or stabilized in the ED. I have low suspicion for any other emergent medical condition which would require further screening, evaluation or treatment in the ED or require inpatient management. At time of discharge the patient is hemodynamically stable and in no acute distress. I have discussed work-up results and diagnosis with patient and answered all questions. Patient is agreeable with discharge plan. We discussed strict return precautions for returning to the emergency department and they verbalized understanding.    Patient management required discussion with the following services or consulting groups:  None  Complexity of Problems Addressed Acute uncomplicated illness or injury with no diagnostics  Additional Data Reviewed and Analyzed Further history obtained from: Past medical history and medications listed in the EMR and Care Everywhere  Patient Encounter Risk Assessment Prescriptions  Final Clinical Impression(s) / ED Diagnoses Final diagnoses:  Abscess    Rx / DC Orders ED Discharge Orders          Ordered    sulfamethoxazole-trimethoprim (BACTRIM DS) 800-160 MG tablet  2 times daily        12/08/22 0911              Mickie Hillier, PA-C 12/08/22 0911    Mickie Hillier, PA-C 12/08/22 0913    Kemper Durie, DO 12/08/22 [phone]

## 2022-12-19 DIAGNOSIS — N529 Male erectile dysfunction, unspecified: Secondary | ICD-10-CM | POA: Diagnosis not present

## 2022-12-19 DIAGNOSIS — L0211 Cutaneous abscess of neck: Secondary | ICD-10-CM | POA: Diagnosis not present

## 2022-12-19 DIAGNOSIS — Z6835 Body mass index (BMI) 35.0-35.9, adult: Secondary | ICD-10-CM | POA: Diagnosis not present

## 2023-01-31 ENCOUNTER — Other Ambulatory Visit: Payer: Self-pay | Admitting: Cardiology

## 2023-01-31 DIAGNOSIS — I350 Nonrheumatic aortic (valve) stenosis: Secondary | ICD-10-CM

## 2023-01-31 DIAGNOSIS — E7800 Pure hypercholesterolemia, unspecified: Secondary | ICD-10-CM

## 2023-01-31 DIAGNOSIS — I2119 ST elevation (STEMI) myocardial infarction involving other coronary artery of inferior wall: Secondary | ICD-10-CM

## 2023-01-31 DIAGNOSIS — I25110 Atherosclerotic heart disease of native coronary artery with unstable angina pectoris: Secondary | ICD-10-CM

## 2023-02-08 DIAGNOSIS — M25511 Pain in right shoulder: Secondary | ICD-10-CM | POA: Diagnosis not present

## 2023-02-15 ENCOUNTER — Ambulatory Visit: Payer: Medicare HMO | Admitting: Podiatry

## 2023-02-20 DIAGNOSIS — E119 Type 2 diabetes mellitus without complications: Secondary | ICD-10-CM | POA: Diagnosis not present

## 2023-03-08 ENCOUNTER — Ambulatory Visit: Payer: Medicare HMO | Admitting: Podiatry

## 2023-03-19 DIAGNOSIS — F324 Major depressive disorder, single episode, in partial remission: Secondary | ICD-10-CM | POA: Diagnosis not present

## 2023-03-19 DIAGNOSIS — I77810 Thoracic aortic ectasia: Secondary | ICD-10-CM | POA: Diagnosis not present

## 2023-03-19 DIAGNOSIS — E1121 Type 2 diabetes mellitus with diabetic nephropathy: Secondary | ICD-10-CM | POA: Diagnosis not present

## 2023-03-19 DIAGNOSIS — I10 Essential (primary) hypertension: Secondary | ICD-10-CM | POA: Diagnosis not present

## 2023-03-19 DIAGNOSIS — Z794 Long term (current) use of insulin: Secondary | ICD-10-CM | POA: Diagnosis not present

## 2023-03-19 DIAGNOSIS — E1165 Type 2 diabetes mellitus with hyperglycemia: Secondary | ICD-10-CM | POA: Diagnosis not present

## 2023-03-19 DIAGNOSIS — E1169 Type 2 diabetes mellitus with other specified complication: Secondary | ICD-10-CM | POA: Diagnosis not present

## 2023-03-19 DIAGNOSIS — E1140 Type 2 diabetes mellitus with diabetic neuropathy, unspecified: Secondary | ICD-10-CM | POA: Diagnosis not present

## 2023-03-19 DIAGNOSIS — I25119 Atherosclerotic heart disease of native coronary artery with unspecified angina pectoris: Secondary | ICD-10-CM | POA: Diagnosis not present

## 2023-03-22 ENCOUNTER — Ambulatory Visit (INDEPENDENT_AMBULATORY_CARE_PROVIDER_SITE_OTHER): Payer: Medicare HMO | Admitting: Podiatry

## 2023-03-22 DIAGNOSIS — Z91199 Patient's noncompliance with other medical treatment and regimen due to unspecified reason: Secondary | ICD-10-CM

## 2023-03-22 NOTE — Progress Notes (Signed)
Pt was a no show for apt, CG 

## 2023-04-22 ENCOUNTER — Encounter (HOSPITAL_BASED_OUTPATIENT_CLINIC_OR_DEPARTMENT_OTHER): Payer: Self-pay | Admitting: Cardiology

## 2023-04-23 NOTE — Telephone Encounter (Signed)
Spoke with patient to schedule appointment and review what to do for worsening symptoms. Patient is schedule for 6/4 with APP. Patient verbalized understanding and had no questions.

## 2023-04-24 ENCOUNTER — Ambulatory Visit: Payer: Medicare HMO | Attending: Nurse Practitioner | Admitting: Nurse Practitioner

## 2023-04-24 ENCOUNTER — Encounter: Payer: Self-pay | Admitting: Nurse Practitioner

## 2023-04-24 VITALS — BP 100/68 | HR 70 | Ht 69.0 in | Wt 203.0 lb

## 2023-04-24 DIAGNOSIS — I10 Essential (primary) hypertension: Secondary | ICD-10-CM | POA: Diagnosis not present

## 2023-04-24 DIAGNOSIS — I25110 Atherosclerotic heart disease of native coronary artery with unstable angina pectoris: Secondary | ICD-10-CM | POA: Diagnosis not present

## 2023-04-24 DIAGNOSIS — E1169 Type 2 diabetes mellitus with other specified complication: Secondary | ICD-10-CM

## 2023-04-24 DIAGNOSIS — Z794 Long term (current) use of insulin: Secondary | ICD-10-CM

## 2023-04-24 DIAGNOSIS — E785 Hyperlipidemia, unspecified: Secondary | ICD-10-CM | POA: Diagnosis not present

## 2023-04-24 DIAGNOSIS — R634 Abnormal weight loss: Secondary | ICD-10-CM | POA: Diagnosis not present

## 2023-04-24 DIAGNOSIS — E1159 Type 2 diabetes mellitus with other circulatory complications: Secondary | ICD-10-CM

## 2023-04-24 LAB — COMPREHENSIVE METABOLIC PANEL
ALT: 35 IU/L (ref 0–44)
AST: 32 IU/L (ref 0–40)
Albumin: 5.1 g/dL — ABNORMAL HIGH (ref 3.8–4.8)
CO2: 23 mmol/L (ref 20–29)

## 2023-04-24 LAB — TSH: TSH: 4.48 u[IU]/mL (ref 0.450–4.500)

## 2023-04-24 MED ORDER — NITROGLYCERIN 0.4 MG SL SUBL: 0.4000 mg | SUBLINGUAL_TABLET | SUBLINGUAL | 3 refills | Status: AC | PRN

## 2023-04-24 MED ORDER — SODIUM CHLORIDE 0.9% FLUSH
3.0000 mL | Freq: Two times a day (BID) | INTRAVENOUS | Status: AC
Start: 2023-04-24 — End: ?

## 2023-04-24 NOTE — Progress Notes (Signed)
Office Visit    Patient Name: [name] [name] Date of Encounter: 04/24/2023  Primary Care Provider:  Daisy Floro, MD Primary Cardiologist:  Armanda Magic, MD Primary Electrophysiologist: None   Past Medical History    Past Medical History:  Diagnosis Date   Coronary artery disease    s/p PCI of LAD w Residual 50-60% RCA; s/p inferior STEMI 01/28/2021 with 99% dRCA beyond the PDA, 50% mid and distal PCA, 70% pRCA, 40% pLAD prior to LAD stent on cath  s/p failed PTCA>>on medical therapy   Diabetes (HCC)    Dilated aortic root (HCC)    40mm by echo 11/2019   Heart attack (HCC)    High cholesterol    History of echocardiogram 06/2011   Normal LVF,mild LVH,trivial TR/MR, mild AS/AI   History of kidney stones    Hypertension    Mild aortic stenosis    mild by echo 11/2019   Tremor    Past Surgical History:  Procedure Laterality Date   arm surgery Right    LEFT HEART CATH AND CORONARY ANGIOGRAPHY N/A 01/28/2021   Procedure: LEFT HEART CATH AND CORONARY ANGIOGRAPHY;  Surgeon: Lyn Records, MD;  Location: [facility];  Service: Cardiovascular;  Laterality: N/A;   stents - placement     x 2    Allergies  Allergies  Allergen Reactions   Codeine Other (See Comments)    Vertigo and upset stomach     History of Present Illness    [name] [name]  is a 72 year old male with a PMH of CAD 2011 after abnormal Lexiscan, s/p PCI to LAD with residual 60% RCA, inferior STEMI 01/2021 with 99% distal RCA lesion unable to cross and medically treated HLD, HTN, mild aortic stenosis, dilated ascending aorta who presents today for complaints of chest pain and dizziness.  [name] [name] has been followed by Dr. Mayford Knife for over 15 years and underwent Cardiolite for chest pain in 2011 which was abnormal.  He had a LHC performed that revealed 99% stenosis of the LAD that was treated with DES x 1.  He was seen on 02/04/2014 and reported doing well with no residual effects.  2D echo was completed  in 2016 with EF of 60 to 65% and mild aortic stenosis.  He was seen in follow-up and 2018 and had elevated blood pressure and was referred to hypertension clinic.  He developed elevated enzymes on statins and was switched to PCSK9 inhibitor.  He was seen for follow-up and 01/2021 and reported sensation of indigestion that radiated to both arms.  He presented to the ED via EMS and EKG revealed hyperacute ST elevated MI.  Code STEMI was activated and patient underwent LHC that showed 99% distal RCA stenosis that was unable to be crossed with wire to perform angioplasty.  Left main was wide open and previous DES was open. 2D echo showed normal LVF with EF 60-65% with G1DD, mild AS with mean AVG and mildly dilated aorta at 41mm.  He was last seen in follow-up on 02/08/2021 by Dr. Mayford Knife.  He reported doing well with no residual complaints.  He reported symptoms of dizziness and chest pain on 6/2 and was scheduled for follow-up visit  [name] [name] presents today for follow-up alone.  Since last being seen in the office patient reports that he is continuing to experience chest pain with and without exertion.  He also notes increased dizziness and a weight loss of 25  pounds which was unintentional.  He notes the discomfort as a dull ache in the left pectoral region.  During last weekend patient experienced an episode that required as needed nitro and almost caused him to go to the ED.  His blood pressure today is controlled at 100/68 and heart rate is 70 bpm.  He reports compliance with his current medication regimen and denies any adverse reactions.  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  He is under a tremendous amount of stress and is worried regarding his current symptoms and possible diagnosis.  He reports extreme loss of appetite and primarily eats little to no food.  He has been drinking Ensure shakes and was advised to try boost shakes due to increased sugar intake of Ensure.  Patient denies   palpitations, dyspnea, PND, orthopnea, nausea, vomiting, dizziness, syncope, edema, weight gain, or early satiety.   Home Medications    Current Outpatient Medications  Medication Sig Dispense Refill   allopurinol (ZYLOPRIM) 300 MG tablet Take 1 tablet (300 mg total) by mouth daily. 30 tablet 6   amLODipine (NORVASC) 10 MG tablet Take 10 mg by mouth daily.     aspirin 81 MG tablet Take 81 mg by mouth daily.     Blood Glucose Monitoring Suppl (FREESTYLE LITE) DEVI Please dispense continuous glucose monitoring system thank you 1 each 0   buPROPion (WELLBUTRIN XL) 300 MG 24 hr tablet Take 1 tablet by mouth daily.     carvedilol (COREG) 6.25 MG tablet Take 1 tablet (6.25 mg total) by mouth 2 (two) times daily with a meal. 120 tablet 3   Cholecalciferol (VITAMIN D) 125 MCG (5000 UT) CAPS Take 1 tablet by mouth daily.     clopidogrel (PLAVIX) 75 MG tablet Take 1 tablet (75 mg total) by mouth daily with breakfast. 90 tablet 3   fenofibrate (TRICOR) 145 MG tablet TAKE 1 TABLET BY MOUTH EVERY DAY 90 tablet 3   Insulin Glargine (LANTUS) 100 UNIT/ML Solostar Pen Inject 10 Units into the skin daily. (Patient taking differently: Inject 14 Units into the skin every morning.) 15 mL 1   isosorbide mononitrate (IMDUR) 30 MG 24 hr tablet Take 1 tablet (30 mg total) by mouth daily. Pt needs to call and make appt with provider for further refills - 1st attempt 30 tablet 0   losartan-hydrochlorothiazide (HYZAAR) 100-12.5 MG tablet Take 1 tablet by mouth daily.     primidone (MYSOLINE) 50 MG tablet 2 tabs in the morning 1 tab at night 270 tablet 4   REPATHA SURECLICK 140 MG/ML SOAJ INJECT 140 MG INTO THE SKIN EVERY 14 (FOURTEEN) DAYS. 6 mL 3   rosuvastatin (CRESTOR) 40 MG tablet Take 1 tablet (40 mg total) by mouth daily. Pt needs to call and make appt with provider for further refills - 1st attempt 30 tablet 0   VITAMIN E PO Take 1 capsule by mouth daily.     gabapentin (NEURONTIN) 300 MG capsule Take 1 capsule  (300 mg total) by mouth daily after supper. 30 capsule 2   nitroGLYCERIN (NITROSTAT) 0.4 MG SL tablet Place 1 tablet (0.4 mg total) under the tongue every 5 (five) minutes as needed for chest pain. 25 tablet 3   No current facility-administered medications for this visit.     Review of Systems  Please see the history of present illness.    (+) Anxiety, depression (+) Chest pain, dizziness, weight loss  All other systems reviewed and are otherwise negative  except as noted above.  Physical Exam    Wt Readings from Last 3 Encounters:  04/24/23 203 lb (92.1 kg)  12/08/22 235 lb (106.6 kg)  08/12/21 209 lb (94.8 kg)   VS: Vitals:   04/24/23 0835  BP: 100/68  Pulse: 70  SpO2: 96%  ,Body mass index is 29.98 kg/m.  Constitutional:      Appearance: Healthy appearance. Not in distress.  Neck:     Vascular: JVD normal.  Pulmonary:     Effort: Pulmonary effort is normal.     Breath sounds: No wheezing. No rales. Diminished in the bases Cardiovascular:     Normal rate. Regular rhythm. Normal S1. Normal S2.      Murmurs: There is no murmur.  Edema:    Peripheral edema absent.  Abdominal:     Palpations: Abdomen is soft non tender. There is no hepatomegaly.  Skin:    General: Skin is warm and dry.  Neurological:     General: No focal deficit present.     Mental Status: Alert and oriented to person, place and time.     Cranial Nerves: Cranial nerves are intact.  EKG/LABS/ Recent Cardiac Studies    ECG personally reviewed by me today -sinus rhythm with left axis deviation and flat T waves in V6 with no acute changes consistent with previous EKG.  Cardiac Studies & Procedures   CARDIAC CATHETERIZATION  CARDIAC CATHETERIZATION 01/28/2021  Narrative  RCA continuation 99% stenosis before the origin of 2 left ventricular branches that are relatively small.  TIMI grade II flow was noted.  Attempt to angioplasty the vessel was unsuccessful as we were unable to cross with a wire.   This resulted in total occlusion of the vessel with recurrence of more substantial symptoms and EKG changes.  Left main is widely patent.  LAD stent is widely patent and tortuous.  Proximal to the LAD stent there is native 30 to 40% narrowing.  Circumflex is widely patent and tortuous.  Inferobasal hypokinesis.  EF 60%.  LVEDP normal.  RECOMMENDATIONS:   IV hydration  IV nitroglycerin  Resume heparin for 48 hours  Aspirin and Plavix  Beta-blocker therapy  Analgesia with fentanyl transitioning to morphine.  Findings Coronary Findings Diagnostic  Dominance: Right  Left Main  Left Anterior Descending Ost LAD to Prox LAD lesion is 40% stenosed. Prox LAD to Mid LAD lesion is 20% stenosed. The lesion was previously treated.  Left Anterior Descending Ost LAD to Prox LAD lesion is 40% stenosed. Prox LAD to Mid LAD lesion is 20% stenosed. The lesion was previously treated.  Left Circumflex The vessel exhibits minimal luminal irregularities.  Right Coronary Artery There is moderate diffuse disease throughout the vessel. Prox RCA lesion is 70% stenosed. Mid RCA lesion is 50% stenosed. Dist RCA lesion is 50% stenosed.  Right Posterior Atrioventricular Artery RPAV lesion is 99% stenosed.  First Right Posterolateral Branch Vessel is small in size.  Intervention  RPAV lesion Angioplasty Post-Intervention Lesion Assessment The intervention was unsuccessful due to inability to cross the lesion with wire. There is a 100% residual stenosis post intervention.   STRESS TESTS  NM MYOCAR MULTI W/SPECT W 02/24/2014   ECHOCARDIOGRAM  ECHOCARDIOGRAM COMPLETE 01/28/2021  Narrative ECHOCARDIOGRAM REPORT    Patient Name:   [name] [name] Date of Exam: 01/28/2021 Medical Rec #:  [id number]     Height:       70.0 in Accession #:    [id number]    Weight:  236.0 lb Date of Birth:  [birth year]     BSA:          2.239 m Patient Age:    69 years      BP:           180/103  mmHg Patient Gender: M             HR:           69 bpm. Exam Location:  Inpatient  Procedure: 2D Echo, Cardiac Doppler and Color Doppler  MODIFIED REPORT: This report was modified by Weston Brass MD on 01/28/2021 due to revision. Indications:     Chest Pain R07.9  History:         Patient has prior history of Echocardiogram examinations, most recent 12/16/2019. CAD; Risk Factors:Hypertension, Dyslipidemia, Diabetes and Non-Smoker. Dilated aortic root. Mild AS.  Sonographer:     Renella Cunas RDCS Referring Phys:  [id number] Corrin Parker Diagnosing Phys: Weston Brass MD  IMPRESSIONS   1. Left ventricular ejection fraction, by estimation, is 60 to 65%. The left ventricle has normal function. The left ventricle demonstrates regional wall motion abnormalities (see scoring diagram/findings for description). There is mild left ventricular hypertrophy. Left ventricular diastolic parameters are consistent with Grade I diastolic dysfunction (impaired relaxation). 2. Right ventricular systolic function is normal. The right ventricular size is normal. There is normal pulmonary artery systolic pressure. 3. The mitral valve is normal in structure. Trivial mitral valve regurgitation. No evidence of mitral stenosis. 4. The aortic valve is abnormal. There is moderate calcification of the aortic valve. Aortic valve regurgitation is trivial. Mild aortic valve stenosis. Aortic valve mean gradient measures 11.0 mmHg. 5. Aortic dilatation noted. There is mild dilatation of the ascending aorta, measuring 41 mm. 6. The inferior vena cava is normal in size with greater than 50% respiratory variability, suggesting right atrial pressure of 3 mmHg.  FINDINGS Left Ventricle: Left ventricular ejection fraction, by estimation, is 60 to 65%. The left ventricle has normal function. The left ventricle demonstrates regional wall motion abnormalities. The left ventricular internal cavity size was normal in  size. There is mild left ventricular hypertrophy. Left ventricular diastolic parameters are consistent with Grade I diastolic dysfunction (impaired relaxation).   LV Wall Scoring: The basal inferolateral segment and basal inferior segment are hypokinetic.  Right Ventricle: The right ventricular size is normal. No increase in right ventricular wall thickness. Right ventricular systolic function is normal. There is normal pulmonary artery systolic pressure. The tricuspid regurgitant velocity is 1.94 m/s, and with an assumed right atrial pressure of 3 mmHg, the estimated right ventricular systolic pressure is 18.1 mmHg.  Left Atrium: Left atrial size was normal in size.  Right Atrium: Right atrial size was normal in size.  Pericardium: There is no evidence of pericardial effusion.  Mitral Valve: The mitral valve is normal in structure. Trivial mitral valve regurgitation. No evidence of mitral valve stenosis.  Tricuspid Valve: The tricuspid valve is normal in structure. Tricuspid valve regurgitation is not demonstrated. No evidence of tricuspid stenosis.  Aortic Valve: The aortic valve is abnormal. There is moderate calcification of the aortic valve. Aortic valve regurgitation is trivial. Aortic regurgitation PHT measures 701 msec. Mild aortic stenosis is present. Aortic valve mean gradient measures 11.0 mmHg. Aortic valve peak gradient measures 19.2 mmHg. Aortic valve area, by VTI measures 2.44 cm.  Pulmonic Valve: The pulmonic valve was grossly normal. Pulmonic valve regurgitation is trivial. No evidence of pulmonic stenosis.  Aorta: Aortic dilatation noted.  There is mild dilatation of the ascending aorta, measuring 41 mm.  Venous: The inferior vena cava is normal in size with greater than 50% respiratory variability, suggesting right atrial pressure of 3 mmHg.  IAS/Shunts: No atrial level shunt detected by color flow Doppler.   LEFT VENTRICLE PLAX 2D LVIDd:         4.20 cm   Diastology LVIDs:         3.20 cm  LV e' medial:    3.65 cm/s LV PW:         1.10 cm  LV E/e' medial:  16.0 LV IVS:        1.10 cm  LV e' lateral:   6.08 cm/s LVOT diam:     2.50 cm  LV E/e' lateral: 9.6 LV SV:         108 LV SV Index:   48 LVOT Area:     4.91 cm   RIGHT VENTRICLE RV S prime:     12.10 cm/s TAPSE (M-mode): 2.2 cm  LEFT ATRIUM             Index       RIGHT ATRIUM           Index LA diam:        3.90 cm 1.74 cm/m  RA Area:     15.10 cm LA Vol (A2C):   34.4 ml 15.36 ml/m RA Volume:   36.30 ml  16.21 ml/m LA Vol (A4C):   37.5 ml 16.75 ml/m LA Biplane Vol: 38.1 ml 17.01 ml/m AORTIC VALVE AV Area (Vmax):    2.53 cm AV Area (Vmean):   2.46 cm AV Area (VTI):     2.44 cm AV Vmax:           219.00 cm/s AV Vmean:          156.000 cm/s AV VTI:            0.440 m AV Peak Grad:      19.2 mmHg AV Mean Grad:      11.0 mmHg LVOT Vmax:         113.00 cm/s LVOT Vmean:        78.300 cm/s LVOT VTI:          0.219 m LVOT/AV VTI ratio: 0.50 AI PHT:            701 msec  AORTA Ao Root diam: 3.40 cm Ao Asc diam:  4.10 cm  MITRAL VALVE               TRICUSPID VALVE MV Area (PHT): 3.31 cm    TR Peak grad:   15.1 mmHg MV Decel Time: 229 msec    TR Vmax:        194.00 cm/s MV E velocity: 58.30 cm/s MV A velocity: 91.90 cm/s  SHUNTS MV E/A ratio:  0.63        Systemic VTI:  0.22 m Systemic Diam: 2.50 cm  Weston Brass MD Electronically signed by Weston Brass MD Signature Date/Time: 01/28/2021/4:19:31 PM    Final (Updated)             Risk Assessment/Calculations:     Lab Results  Component Value Date   WBC 10.5 01/31/2021   HGB 14.9 01/31/2021   HCT 42.4 01/31/2021   MCV 88.3 01/31/2021   PLT 146 (L) 01/31/2021   Lab Results  Component Value Date   CREATININE 1.00 01/31/2021   BUN 7 (L) 01/31/2021  NA 134 (L) 01/31/2021   K 3.7 01/31/2021   CL 101 01/31/2021   CO2 23 01/31/2021   Lab Results  Component Value Date   ALT 19 01/28/2021    AST 25 01/28/2021   ALKPHOS 48 01/28/2021   BILITOT 1.0 01/28/2021   Lab Results  Component Value Date   CHOL 224 (H) 01/28/2021   HDL 36 (L) 01/28/2021   LDLCALC 144 (H) 01/28/2021   LDLDIRECT 54.0 08/18/2015   TRIG 218 (H) 01/28/2021   CHOLHDL 6.2 01/28/2021    Lab Results  Component Value Date   HGBA1C 9.6 (H) 01/28/2021     Assessment & Plan    1.  Coronary artery disease: -s/p DES to LAD 2011 with STEMI in 01/2021 with cath showing 99% dRCA beyond the PDA, 50% mid and distal PCA, 70% pRCA, 40% pLAD prior to LAD stent on cath  s/p failed PTCA>>on medical therapy  -During today's visit patient reports ongoing chest discomfort of the left pectoral region that is relieved with rest and occurs with and without exertion. -Patient's case was discussed with DOD Dr. Eldridge Dace who was in agreement with pursuing repeat left heart catheterization at this time. -Patient was advised to take as needed Nitrostat and go to the ED if chest pain reoccurs and is not relieved with rest and nitroglycerin. -BMET and CBC today -Continue GDMT with ASA 81 mg, carvedilol 6.25 mg twice daily, Plavix 75 mg, Imdur 30 mg daily, Nitrostat as needed and Crestor 40 mg daily  2.  Essential hypertension: -Patient's blood pressure today was 100/68 -Continue carvedilol 6.25 mg twice daily and Hyzaar 100-12.5 mg daily  3.  Hyperlipidemia: -Patient's LDL cholesterol is 71 -Continue Crestor 40 mg daily  4.  DM type II: -Patient's last hemoglobin A1c was 11.3 -Continue treatment plan per PCP  5.  Fatigue and unintentional weight loss: -Patient reports 30+ pound weight loss over the past 3 months. -He also endorses occasional episodes of dizziness and fatigue. -We will check TSH and CMET today to rule out organic causes  Disposition: Follow-up with Armanda Magic, MD or APP in 1 months Shared Decision Making/Informed Consent The risks [stroke (1 in 1000), death (1 in 1000), kidney failure [usually temporary]  (1 in 500), bleeding (1 in 200), allergic reaction [possibly serious] (1 in 200)], benefits (diagnostic support and management of coronary artery disease) and alternatives of a cardiac catheterization were discussed in detail with Mr. Tweedle and he is willing to proceed.   Medication Adjustments/Labs and Tests Ordered: Current medicines are reviewed at length with the patient today.  Concerns regarding medicines are outlined above.   Signed, Napoleon Form, Leodis Rains, NP 04/24/2023, 11:57 AM [facility]

## 2023-04-24 NOTE — Patient Instructions (Addendum)
Medication Instructions:  Your physician recommends that you continue on your current medications as directed. Please refer to the Current Medication list given to you today.` *If you need a refill on your cardiac medications before your next appointment, please call your pharmacy*   Lab Work: TODAY-CMET, CBC & TSH If you have labs (blood work) drawn today and your tests are completely normal, you will receive your results only by: MyChart Message (if you have MyChart) OR A paper copy in the mail If you have any lab test that is abnormal or we need to change your treatment, we will call you to review the results.   Testing/Procedures: Your physician has requested that you have a cardiac catheterization. Cardiac catheterization is used to diagnose and/or treat various heart conditions. Doctors may recommend this procedure for a number of different reasons. The most common reason is to evaluate chest pain. Chest pain can be a symptom of coronary artery disease (CAD), and cardiac catheterization can show whether plaque is narrowing or blocking your heart's arteries. This procedure is also used to evaluate the valves, as well as measure the blood flow and oxygen levels in different parts of your heart. For further information please visit [email or web address] Please follow instruction sheet, as given.   Follow-Up: At [facility], you and your health needs are our priority.  As part of our continuing mission to provide you with exceptional heart care, we have created designated Provider Care Teams.  These Care Teams include your primary Cardiologist (physician) and Advanced Practice Providers (APPs -  Physician Assistants and Nurse Practitioners) who all work together to provide you with the care you need, when you need it.  We recommend signing up for the patient portal called "MyChart".  Sign up information is provided on this After Visit Summary.  MyChart is used to connect with patients  for Virtual Visits (Telemedicine).  Patients are able to view lab/test results, encounter notes, upcoming appointments, etc.  Non-urgent messages can be sent to your provider as well.   To learn more about what you can do with MyChart, go to [email or web address].    Your next appointment:   1 month(s)  Provider:   Robin Searing, NP       Other Instructions DRINK BOOST OR ENSURE CALL us TODAY AND LET us KNOW IF YOU ARE TAKING THE IMDUR            Cardiac Catheterization   You are scheduled for a Cardiac Catheterization on Friday, June 7 with Dr. Lance Muss.  1. Please arrive at the [facility] (Main Entrance A) at [facility]: [address] [place], [place] [address] at 7:00 AM (This time is 9 hour(s) before your procedure to ensure your preparation). Free valet parking service is available. You will check in at ADMITTING. The support person will be asked to wait in the waiting room.  It is OK to have someone drop you off and come back when you are ready to be discharged.        Special note: Every effort is made to have your procedure done on time. Please understand that emergencies sometimes delay scheduled procedures.  2. Diet: Do not eat solid foods after midnight.  You may have clear liquids until 5 AM the day of the procedure.  3. Labs: You will need to have blood drawn on Tuesday, June 4 at [facility] at [facility] [address], [place]  Open: 7:30am - 5pm  Phone: [phone]. You do not need to be fasting.  4. Medication instructions in preparation for your procedure:   Contrast Allergy: No  DO NOT TAKE LOSARTAN-HYDROCHLOROTHIAZIDE ON THE MORNING OF YOUR PROCEDURE  DO NOT TAKE ANY INSULIN ON THE MORNING OF YOUR PROCEDURE    On the morning of your procedure, take Aspirin 81 mg and any morning medicines NOT listed above.  You may use sips of water.  5. Plan to go home the same day, you will only stay overnight if  medically necessary. 6. You MUST have a responsible adult to drive you home. 7. An adult MUST be with you the first 24 hours after you arrive home. 8. Bring a current list of your medications, and the last time and date medication taken. 9. Bring ID and current insurance cards. 10.Please wear clothes that are easy to get on and off and wear slip-on shoes.  Thank you for allowing [place] to care for you!   -- [facility]

## 2023-04-24 NOTE — H&P (View-Only) (Signed)
  Office Visit    Patient Name: [name] [name] Date of Encounter: 04/24/2023  Primary Care Provider:  Ross, Charles Alan, MD Primary Cardiologist:  Traci Turner, MD Primary Electrophysiologist: None   Past Medical History    Past Medical History:  Diagnosis Date   Coronary artery disease    s/p PCI of LAD w Residual 50-60% RCA; s/p inferior STEMI 01/28/2021 with 99% dRCA beyond the PDA, 50% mid and distal PCA, 70% pRCA, 40% pLAD prior to LAD stent on cath  s/p failed PTCA>>on medical therapy   Diabetes (HCC)    Dilated aortic root (HCC)    40mm by echo 11/2019   Heart attack (HCC)    High cholesterol    History of echocardiogram 06/2011   Normal LVF,mild LVH,trivial TR/MR, mild AS/AI   History of kidney stones    Hypertension    Mild aortic stenosis    mild by echo 11/2019   Tremor    Past Surgical History:  Procedure Laterality Date   arm surgery Right    LEFT HEART CATH AND CORONARY ANGIOGRAPHY N/A 01/28/2021   Procedure: LEFT HEART CATH AND CORONARY ANGIOGRAPHY;  Surgeon: Smith, Henry W, MD;  Location: [facility];  Service: Cardiovascular;  Laterality: N/A;   stents - placement     x 2    Allergies  Allergies  Allergen Reactions   Codeine Other (See Comments)    Vertigo and upset stomach     History of Present Illness    [name] [name]  is a 72 year old male with a PMH of CAD 2011 after abnormal Lexiscan, s/p PCI to LAD with residual 60% RCA, inferior STEMI 01/2021 with 99% distal RCA lesion unable to cross and medically treated HLD, HTN, mild aortic stenosis, dilated ascending aorta who presents today for complaints of chest pain and dizziness.  [name] [name] has been followed by Dr. Turner for over 15 years and underwent Cardiolite for chest pain in 2011 which was abnormal.  He had a LHC performed that revealed 99% stenosis of the LAD that was treated with DES x 1.  He was seen on 02/04/2014 and reported doing well with no residual effects.  2D echo was completed  in 2016 with EF of 60 to 65% and mild aortic stenosis.  He was seen in follow-up and 2018 and had elevated blood pressure and was referred to hypertension clinic.  He developed elevated enzymes on statins and was switched to PCSK9 inhibitor.  He was seen for follow-up and 01/2021 and reported sensation of indigestion that radiated to both arms.  He presented to the ED via EMS and EKG revealed hyperacute ST elevated MI.  Code STEMI was activated and patient underwent LHC that showed 99% distal RCA stenosis that was unable to be crossed with wire to perform angioplasty.  Left main was wide open and previous DES was open. 2D echo showed normal LVF with EF 60-65% with G1DD, mild AS with mean AVG 11mmHg and mildly dilated aorta at 41mm.  He was last seen in follow-up on 02/08/2021 by Dr. Turner.  He reported doing well with no residual complaints.  He reported symptoms of dizziness and chest pain on 6/2 and was scheduled for follow-up visit  [name] [name] presents today for follow-up alone.  Since last being seen in the office patient reports that he is continuing to experience chest pain with and without exertion.  He also notes increased dizziness and a weight loss of 25   pounds which was unintentional.  He notes the discomfort as a dull ache in the left pectoral region.  During last weekend patient experienced an episode that required as needed nitro and almost caused him to go to the ED.  His blood pressure today is controlled at 100/68 and heart rate is 70 bpm.  He reports compliance with his current medication regimen and denies any adverse reactions.  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  He is under a tremendous amount of stress and is worried regarding his current symptoms and possible diagnosis.  He reports extreme loss of appetite and primarily eats little to no food.  He has been drinking Ensure shakes and was advised to try boost shakes due to increased sugar intake of Ensure.  Patient denies   palpitations, dyspnea, PND, orthopnea, nausea, vomiting, dizziness, syncope, edema, weight gain, or early satiety.   Home Medications    Current Outpatient Medications  Medication Sig Dispense Refill   allopurinol (ZYLOPRIM) 300 MG tablet Take 1 tablet (300 mg total) by mouth daily. 30 tablet 6   amLODipine (NORVASC) 10 MG tablet Take 10 mg by mouth daily.     aspirin 81 MG tablet Take 81 mg by mouth daily.     Blood Glucose Monitoring Suppl (FREESTYLE LITE) DEVI Please dispense continuous glucose monitoring system thank you 1 each 0   buPROPion (WELLBUTRIN XL) 300 MG 24 hr tablet Take 1 tablet by mouth daily.     carvedilol (COREG) 6.25 MG tablet Take 1 tablet (6.25 mg total) by mouth 2 (two) times daily with a meal. 120 tablet 3   Cholecalciferol (VITAMIN D) 125 MCG (5000 UT) CAPS Take 1 tablet by mouth daily.     clopidogrel (PLAVIX) 75 MG tablet Take 1 tablet (75 mg total) by mouth daily with breakfast. 90 tablet 3   fenofibrate (TRICOR) 145 MG tablet TAKE 1 TABLET BY MOUTH EVERY DAY 90 tablet 3   Insulin Glargine (LANTUS) 100 UNIT/ML Solostar Pen Inject 10 Units into the skin daily. (Patient taking differently: Inject 14 Units into the skin every morning.) 15 mL 1   isosorbide mononitrate (IMDUR) 30 MG 24 hr tablet Take 1 tablet (30 mg total) by mouth daily. Pt needs to call and make appt with provider for further refills - 1st attempt 30 tablet 0   losartan-hydrochlorothiazide (HYZAAR) 100-12.5 MG tablet Take 1 tablet by mouth daily.     primidone (MYSOLINE) 50 MG tablet 2 tabs in the morning 1 tab at night 270 tablet 4   REPATHA SURECLICK 140 MG/ML SOAJ INJECT 140 MG INTO THE SKIN EVERY 14 (FOURTEEN) DAYS. 6 mL 3   rosuvastatin (CRESTOR) 40 MG tablet Take 1 tablet (40 mg total) by mouth daily. Pt needs to call and make appt with provider for further refills - 1st attempt 30 tablet 0   VITAMIN E PO Take 1 capsule by mouth daily.     gabapentin (NEURONTIN) 300 MG capsule Take 1 capsule  (300 mg total) by mouth daily after supper. 30 capsule 2   nitroGLYCERIN (NITROSTAT) 0.4 MG SL tablet Place 1 tablet (0.4 mg total) under the tongue every 5 (five) minutes as needed for chest pain. 25 tablet 3   No current facility-administered medications for this visit.     Review of Systems  Please see the history of present illness.    (+) Anxiety, depression (+) Chest pain, dizziness, weight loss  All other systems reviewed and are otherwise negative   except as noted above.  Physical Exam    Wt Readings from Last 3 Encounters:  04/24/23 203 lb (92.1 kg)  12/08/22 235 lb (106.6 kg)  08/12/21 209 lb (94.8 kg)   VS: Vitals:   04/24/23 0835  BP: 100/68  Pulse: 70  SpO2: 96%  ,Body mass index is 29.98 kg/m.  Constitutional:      Appearance: Healthy appearance. Not in distress.  Neck:     Vascular: JVD normal.  Pulmonary:     Effort: Pulmonary effort is normal.     Breath sounds: No wheezing. No rales. Diminished in the bases Cardiovascular:     Normal rate. Regular rhythm. Normal S1. Normal S2.      Murmurs: There is no murmur.  Edema:    Peripheral edema absent.  Abdominal:     Palpations: Abdomen is soft non tender. There is no hepatomegaly.  Skin:    General: Skin is warm and dry.  Neurological:     General: No focal deficit present.     Mental Status: Alert and oriented to person, place and time.     Cranial Nerves: Cranial nerves are intact.  EKG/LABS/ Recent Cardiac Studies    ECG personally reviewed by me today -sinus rhythm with left axis deviation and flat T waves in V6 with no acute changes consistent with previous EKG.  Cardiac Studies & Procedures   CARDIAC CATHETERIZATION  CARDIAC CATHETERIZATION 01/28/2021  Narrative  RCA continuation 99% stenosis before the origin of 2 left ventricular branches that are relatively small.  TIMI grade II flow was noted.  Attempt to angioplasty the vessel was unsuccessful as we were unable to cross with a wire.   This resulted in total occlusion of the vessel with recurrence of more substantial symptoms and EKG changes.  Left main is widely patent.  LAD stent is widely patent and tortuous.  Proximal to the LAD stent there is native 30 to 40% narrowing.  Circumflex is widely patent and tortuous.  Inferobasal hypokinesis.  EF 60%.  LVEDP normal.  RECOMMENDATIONS:   IV hydration  IV nitroglycerin  Resume heparin for 48 hours  Aspirin and Plavix  Beta-blocker therapy  Analgesia with fentanyl transitioning to morphine.  Findings Coronary Findings Diagnostic  Dominance: Right  Left Main  Left Anterior Descending Ost LAD to Prox LAD lesion is 40% stenosed. Prox LAD to Mid LAD lesion is 20% stenosed. The lesion was previously treated.  Left Anterior Descending Ost LAD to Prox LAD lesion is 40% stenosed. Prox LAD to Mid LAD lesion is 20% stenosed. The lesion was previously treated.  Left Circumflex The vessel exhibits minimal luminal irregularities.  Right Coronary Artery There is moderate diffuse disease throughout the vessel. Prox RCA lesion is 70% stenosed. Mid RCA lesion is 50% stenosed. Dist RCA lesion is 50% stenosed.  Right Posterior Atrioventricular Artery RPAV lesion is 99% stenosed.  First Right Posterolateral Branch Vessel is small in size.  Intervention  RPAV lesion Angioplasty Post-Intervention Lesion Assessment The intervention was unsuccessful due to inability to cross the lesion with wire. There is a 100% residual stenosis post intervention.   STRESS TESTS  NM MYOCAR MULTI W/SPECT W 02/24/2014   ECHOCARDIOGRAM  ECHOCARDIOGRAM COMPLETE 01/28/2021  Narrative ECHOCARDIOGRAM REPORT    Patient Name:   [name] [name] Date of Exam: 01/28/2021 Medical Rec #:  [id number]     Height:       70.0 in Accession #:    [id number]    Weight:         236.0 lb Date of Birth:  07/19/1951     BSA:          2.239 m Patient Age:    69 years      BP:           180/103  mmHg Patient Gender: M             HR:           69 bpm. Exam Location:  Inpatient  Procedure: 2D Echo, Cardiac Doppler and Color Doppler  MODIFIED REPORT: This report was modified by Gayatri Acharya MD on 01/28/2021 due to revision. Indications:     Chest Pain R07.9  History:         Patient has prior history of Echocardiogram examinations, most recent 12/16/2019. CAD; Risk Factors:Hypertension, Dyslipidemia, Diabetes and Non-Smoker. Dilated aortic root. Mild AS.  Sonographer:     Julia Swaim RDCS Referring Phys:  [id number] CALLIE E GOODRICH Diagnosing Phys: Gayatri Acharya MD  IMPRESSIONS   1. Left ventricular ejection fraction, by estimation, is 60 to 65%. The left ventricle has normal function. The left ventricle demonstrates regional wall motion abnormalities (see scoring diagram/findings for description). There is mild left ventricular hypertrophy. Left ventricular diastolic parameters are consistent with Grade I diastolic dysfunction (impaired relaxation). 2. Right ventricular systolic function is normal. The right ventricular size is normal. There is normal pulmonary artery systolic pressure. 3. The mitral valve is normal in structure. Trivial mitral valve regurgitation. No evidence of mitral stenosis. 4. The aortic valve is abnormal. There is moderate calcification of the aortic valve. Aortic valve regurgitation is trivial. Mild aortic valve stenosis. Aortic valve mean gradient measures 11.0 mmHg. 5. Aortic dilatation noted. There is mild dilatation of the ascending aorta, measuring 41 mm. 6. The inferior vena cava is normal in size with greater than 50% respiratory variability, suggesting right atrial pressure of 3 mmHg.  FINDINGS Left Ventricle: Left ventricular ejection fraction, by estimation, is 60 to 65%. The left ventricle has normal function. The left ventricle demonstrates regional wall motion abnormalities. The left ventricular internal cavity size was normal in  size. There is mild left ventricular hypertrophy. Left ventricular diastolic parameters are consistent with Grade I diastolic dysfunction (impaired relaxation).   LV Wall Scoring: The basal inferolateral segment and basal inferior segment are hypokinetic.  Right Ventricle: The right ventricular size is normal. No increase in right ventricular wall thickness. Right ventricular systolic function is normal. There is normal pulmonary artery systolic pressure. The tricuspid regurgitant velocity is 1.94 m/s, and with an assumed right atrial pressure of 3 mmHg, the estimated right ventricular systolic pressure is 18.1 mmHg.  Left Atrium: Left atrial size was normal in size.  Right Atrium: Right atrial size was normal in size.  Pericardium: There is no evidence of pericardial effusion.  Mitral Valve: The mitral valve is normal in structure. Trivial mitral valve regurgitation. No evidence of mitral valve stenosis.  Tricuspid Valve: The tricuspid valve is normal in structure. Tricuspid valve regurgitation is not demonstrated. No evidence of tricuspid stenosis.  Aortic Valve: The aortic valve is abnormal. There is moderate calcification of the aortic valve. Aortic valve regurgitation is trivial. Aortic regurgitation PHT measures 701 msec. Mild aortic stenosis is present. Aortic valve mean gradient measures 11.0 mmHg. Aortic valve peak gradient measures 19.2 mmHg. Aortic valve area, by VTI measures 2.44 cm.  Pulmonic Valve: The pulmonic valve was grossly normal. Pulmonic valve regurgitation is trivial. No evidence of pulmonic stenosis.  Aorta: Aortic dilatation noted.   There is mild dilatation of the ascending aorta, measuring 41 mm.  Venous: The inferior vena cava is normal in size with greater than 50% respiratory variability, suggesting right atrial pressure of 3 mmHg.  IAS/Shunts: No atrial level shunt detected by color flow Doppler.   LEFT VENTRICLE PLAX 2D LVIDd:         4.20 cm   Diastology LVIDs:         3.20 cm  LV e' medial:    3.65 cm/s LV PW:         1.10 cm  LV E/e' medial:  16.0 LV IVS:        1.10 cm  LV e' lateral:   6.08 cm/s LVOT diam:     2.50 cm  LV E/e' lateral: 9.6 LV SV:         108 LV SV Index:   48 LVOT Area:     4.91 cm   RIGHT VENTRICLE RV S prime:     12.10 cm/s TAPSE (M-mode): 2.2 cm  LEFT ATRIUM             Index       RIGHT ATRIUM           Index LA diam:        3.90 cm 1.74 cm/m  RA Area:     15.10 cm LA Vol (A2C):   34.4 ml 15.36 ml/m RA Volume:   36.30 ml  16.21 ml/m LA Vol (A4C):   37.5 ml 16.75 ml/m LA Biplane Vol: 38.1 ml 17.01 ml/m AORTIC VALVE AV Area (Vmax):    2.53 cm AV Area (Vmean):   2.46 cm AV Area (VTI):     2.44 cm AV Vmax:           219.00 cm/s AV Vmean:          156.000 cm/s AV VTI:            0.440 m AV Peak Grad:      19.2 mmHg AV Mean Grad:      11.0 mmHg LVOT Vmax:         113.00 cm/s LVOT Vmean:        78.300 cm/s LVOT VTI:          0.219 m LVOT/AV VTI ratio: 0.50 AI PHT:            701 msec  AORTA Ao Root diam: 3.40 cm Ao Asc diam:  4.10 cm  MITRAL VALVE               TRICUSPID VALVE MV Area (PHT): 3.31 cm    TR Peak grad:   15.1 mmHg MV Decel Time: 229 msec    TR Vmax:        194.00 cm/s MV E velocity: 58.30 cm/s MV A velocity: 91.90 cm/s  SHUNTS MV E/A ratio:  0.63        Systemic VTI:  0.22 m Systemic Diam: 2.50 cm  Gayatri Acharya MD Electronically signed by Gayatri Acharya MD Signature Date/Time: 01/28/2021/4:19:31 PM    Final (Updated)             Risk Assessment/Calculations:     Lab Results  Component Value Date   WBC 10.5 01/31/2021   HGB 14.9 01/31/2021   HCT 42.4 01/31/2021   MCV 88.3 01/31/2021   PLT 146 (L) 01/31/2021   Lab Results  Component Value Date   CREATININE 1.00 01/31/2021   BUN 7 (L) 01/31/2021     NA 134 (L) 01/31/2021   K 3.7 01/31/2021   CL 101 01/31/2021   CO2 23 01/31/2021   Lab Results  Component Value Date   ALT 19 01/28/2021    AST 25 01/28/2021   ALKPHOS 48 01/28/2021   BILITOT 1.0 01/28/2021   Lab Results  Component Value Date   CHOL 224 (H) 01/28/2021   HDL 36 (L) 01/28/2021   LDLCALC 144 (H) 01/28/2021   LDLDIRECT 54.0 08/18/2015   TRIG 218 (H) 01/28/2021   CHOLHDL 6.2 01/28/2021    Lab Results  Component Value Date   HGBA1C 9.6 (H) 01/28/2021     Assessment & Plan    1.  Coronary artery disease: -s/p DES to LAD 2011 with STEMI in 01/2021 with cath showing 99% dRCA beyond the PDA, 50% mid and distal PCA, 70% pRCA, 40% pLAD prior to LAD stent on cath  s/p failed PTCA>>on medical therapy  -During today's visit patient reports ongoing chest discomfort of the left pectoral region that is relieved with rest and occurs with and without exertion. -Patient's case was discussed with DOD Dr. Varanasi who was in agreement with pursuing repeat left heart catheterization at this time. -Patient was advised to take as needed Nitrostat and go to the ED if chest pain reoccurs and is not relieved with rest and nitroglycerin. -BMET and CBC today -Continue GDMT with ASA 81 mg, carvedilol 6.25 mg twice daily, Plavix 75 mg, Imdur 30 mg daily, Nitrostat as needed and Crestor 40 mg daily  2.  Essential hypertension: -Patient's blood pressure today was 100/68 -Continue carvedilol 6.25 mg twice daily and Hyzaar 100-12.5 mg daily  3.  Hyperlipidemia: -Patient's LDL cholesterol is 71 -Continue Crestor 40 mg daily  4.  DM type II: -Patient's last hemoglobin A1c was 11.3 -Continue treatment plan per PCP  5.  Fatigue and unintentional weight loss: -Patient reports 30+ pound weight loss over the past 3 months. -He also endorses occasional episodes of dizziness and fatigue. -We will check TSH and CMET today to rule out organic causes  Disposition: Follow-up with Traci Turner, MD or APP in 1 months Shared Decision Making/Informed Consent The risks [stroke (1 in 1000), death (1 in 1000), kidney failure [usually temporary]  (1 in 500), bleeding (1 in 200), allergic reaction [possibly serious] (1 in 200)], benefits (diagnostic support and management of coronary artery disease) and alternatives of a cardiac catheterization were discussed in detail with Mr. Lui and he is willing to proceed.   Medication Adjustments/Labs and Tests Ordered: Current medicines are reviewed at length with the patient today.  Concerns regarding medicines are outlined above.   Signed, Tri Chittick Jr, Parsa Rickett Henry, NP 04/24/2023, 11:57 AM [facility] 

## 2023-04-25 LAB — CBC
Hematocrit: 48.8 % (ref 37.5–51.0)
Hemoglobin: 17.2 g/dL (ref 13.0–17.7)
MCH: 31.8 pg (ref 26.6–33.0)
MCHC: 35.2 g/dL (ref 31.5–35.7)
MCV: 90 fL (ref 79–97)
Platelets: 197 10*3/uL (ref 150–450)
RBC: 5.41 x10E6/uL (ref 4.14–5.80)
RDW: 13 % (ref 11.6–15.4)
WBC: 9.3 10*3/uL (ref 3.4–10.8)

## 2023-04-25 LAB — COMPREHENSIVE METABOLIC PANEL
Albumin/Globulin Ratio: 2.3 — ABNORMAL HIGH (ref 1.2–2.2)
Alkaline Phosphatase: 94 IU/L (ref 44–121)
BUN/Creatinine Ratio: 16 (ref 10–24)
BUN: 23 mg/dL (ref 8–27)
Bilirubin Total: 0.7 mg/dL (ref 0.0–1.2)
Calcium: 10.7 mg/dL — ABNORMAL HIGH (ref 8.6–10.2)
Chloride: 94 mmol/L — ABNORMAL LOW (ref 96–106)
Creatinine, Ser: 1.45 mg/dL — ABNORMAL HIGH (ref 0.76–1.27)
Globulin, Total: 2.2 g/dL (ref 1.5–4.5)
Glucose: 300 mg/dL — ABNORMAL HIGH (ref 70–99)
Potassium: 4.5 mmol/L (ref 3.5–5.2)
Sodium: 134 mmol/L (ref 134–144)
Total Protein: 7.3 g/dL (ref 6.0–8.5)
eGFR: 52 mL/min/{1.73_m2} — ABNORMAL LOW (ref >59)

## 2023-04-26 ENCOUNTER — Telehealth: Payer: Self-pay | Admitting: Cardiology

## 2023-04-26 ENCOUNTER — Telehealth: Payer: Self-pay | Admitting: *Deleted

## 2023-04-26 NOTE — Telephone Encounter (Signed)
Received message from prior authorization that we have not received authorization for procedure tomorrow. I spoke with patient to let him know that we do not have authorization on file yet. Patient reports he is concerned about his symptoms and does not feel he can wait. Patient reports he is okay at rest, but when he tries to do any physical activity/exertion at all, he has chest discomfort, feels bad, dizziness.  Also, he is unable to work because of these symptoms.  Per Dr Blima Singer feels if patient is having worsening symptoms, the safer thing is to proceed.  Patient has decided he will proceed with cardiac cath tomorrow, is aware pre-cert has requested authorization be expedited.  Patient advised to take it easy, avoid physical activity, call 911 if chest discomfort/symptoms persist.

## 2023-04-26 NOTE — Telephone Encounter (Signed)
Cardiac Catheterization scheduled at [facility] for: Friday April 27, 2023 9 AM Arrival time [facility] Main Entrance A at: 7 AM  Nothing to eat after midnight prior to procedure, clear liquids until 5 AM day of procedure.  Medication instructions: -Hold:  Insulin-AM of procedure  Lisinopril-HCT-AM of procedure -Other usual morning medications can be taken with sips of water including aspirin 81 mg and Plavix 75 mg  Confirmed patient has responsible adult to drive home post procedure and be with patient first 24 hours after arriving home.  Plan to go home the same day, you will only stay overnight if medically necessary.   Reviewed procedure instructions with patient.

## 2023-04-26 NOTE — Telephone Encounter (Signed)
Patient said that he is supposed to have a heart catherization on Friday 6/7, but said that Dr. Eldridge Dace did not approve it per his insurance. Would like for someone to give him a call asap to discuss what is going on

## 2023-04-27 ENCOUNTER — Other Ambulatory Visit: Payer: Self-pay

## 2023-04-27 ENCOUNTER — Ambulatory Visit (HOSPITAL_COMMUNITY)
Admission: RE | Admit: 2023-04-27 | Discharge: 2023-04-27 | Disposition: A | Discharge status: Home / Self Care | Payer: Medicare HMO | Attending: Interventional Cardiology | Admitting: Interventional Cardiology

## 2023-04-27 ENCOUNTER — Encounter (HOSPITAL_COMMUNITY)
Admission: RE | Disposition: A | Discharge status: Home / Self Care | Payer: Self-pay | Source: Home / Self Care | Attending: Interventional Cardiology

## 2023-04-27 DIAGNOSIS — R634 Abnormal weight loss: Secondary | ICD-10-CM | POA: Insufficient documentation

## 2023-04-27 DIAGNOSIS — Z6829 Body mass index (BMI) 29.0-29.9, adult: Secondary | ICD-10-CM | POA: Insufficient documentation

## 2023-04-27 DIAGNOSIS — I700 Atherosclerosis of aorta: Secondary | ICD-10-CM | POA: Insufficient documentation

## 2023-04-27 DIAGNOSIS — Z79899 Other long term (current) drug therapy: Secondary | ICD-10-CM | POA: Insufficient documentation

## 2023-04-27 DIAGNOSIS — Z955 Presence of coronary angioplasty implant and graft: Secondary | ICD-10-CM | POA: Insufficient documentation

## 2023-04-27 DIAGNOSIS — Z7982 Long term (current) use of aspirin: Secondary | ICD-10-CM | POA: Diagnosis not present

## 2023-04-27 DIAGNOSIS — Z794 Long term (current) use of insulin: Secondary | ICD-10-CM | POA: Insufficient documentation

## 2023-04-27 DIAGNOSIS — Z7902 Long term (current) use of antithrombotics/antiplatelets: Secondary | ICD-10-CM | POA: Diagnosis not present

## 2023-04-27 DIAGNOSIS — I252 Old myocardial infarction: Secondary | ICD-10-CM | POA: Insufficient documentation

## 2023-04-27 DIAGNOSIS — I10 Essential (primary) hypertension: Secondary | ICD-10-CM | POA: Diagnosis not present

## 2023-04-27 DIAGNOSIS — I25110 Atherosclerotic heart disease of native coronary artery with unstable angina pectoris: Secondary | ICD-10-CM | POA: Diagnosis not present

## 2023-04-27 DIAGNOSIS — E119 Type 2 diabetes mellitus without complications: Secondary | ICD-10-CM | POA: Diagnosis not present

## 2023-04-27 DIAGNOSIS — E785 Hyperlipidemia, unspecified: Secondary | ICD-10-CM | POA: Insufficient documentation

## 2023-04-27 HISTORY — PX: LEFT HEART CATH AND CORONARY ANGIOGRAPHY: CATH118249

## 2023-04-27 LAB — GLUCOSE, CAPILLARY
Glucose-Capillary: 248 mg/dL — ABNORMAL HIGH (ref 70–99)
Glucose-Capillary: 235 mg/dL — ABNORMAL HIGH (ref 70–99)

## 2023-04-27 SURGERY — LEFT HEART CATH AND CORONARY ANGIOGRAPHY
Anesthesia: LOCAL

## 2023-04-27 MED ORDER — SODIUM CHLORIDE 0.9 % WEIGHT BASED INFUSION
3.0000 mL/kg/h | INTRAVENOUS | Status: AC
  Administered 2023-04-27: 3 mL/kg/h via INTRAVENOUS

## 2023-04-27 MED ORDER — ONDANSETRON HCL 4 MG/2ML IJ SOLN: 4.0000 mg | Freq: Four times a day (QID) | INTRAMUSCULAR | Status: DC | PRN

## 2023-04-27 MED ORDER — ASPIRIN 81 MG PO CHEW: 81.0000 mg | CHEWABLE_TABLET | ORAL | Status: DC

## 2023-04-27 MED ORDER — HEPARIN SODIUM (PORCINE) 1000 UNIT/ML IJ SOLN
INTRAMUSCULAR | Status: AC
  Filled 2023-04-27: qty 10

## 2023-04-27 MED ORDER — VERAPAMIL HCL 2.5 MG/ML IV SOLN
INTRAVENOUS | Status: DC | PRN
  Administered 2023-04-27 (×2): 10 mL via INTRA_ARTERIAL

## 2023-04-27 MED ORDER — SODIUM CHLORIDE 0.9 % IV SOLN: INTRAVENOUS | Status: DC

## 2023-04-27 MED ORDER — SODIUM CHLORIDE 0.9 % IV SOLN: 250.0000 mL | INTRAVENOUS | Status: DC | PRN

## 2023-04-27 MED ORDER — FENTANYL CITRATE (PF) 100 MCG/2ML IJ SOLN
INTRAMUSCULAR | Status: DC | PRN
  Administered 2023-04-27: 25 ug via INTRAVENOUS

## 2023-04-27 MED ORDER — FENTANYL CITRATE (PF) 100 MCG/2ML IJ SOLN
INTRAMUSCULAR | Status: AC
  Filled 2023-04-27: qty 2

## 2023-04-27 MED ORDER — ACETAMINOPHEN 325 MG PO TABS: 650.0000 mg | ORAL_TABLET | ORAL | Status: DC | PRN

## 2023-04-27 MED ORDER — SODIUM CHLORIDE 0.9% FLUSH: 3.0000 mL | Freq: Two times a day (BID) | INTRAVENOUS | Status: DC

## 2023-04-27 MED ORDER — IOHEXOL 350 MG/ML SOLN
INTRAVENOUS | Status: DC | PRN
  Administered 2023-04-27: 55 mL

## 2023-04-27 MED ORDER — SODIUM CHLORIDE 0.9% FLUSH: 3.0000 mL | INTRAVENOUS | Status: DC | PRN

## 2023-04-27 MED ORDER — LABETALOL HCL 5 MG/ML IV SOLN: 10.0000 mg | INTRAVENOUS | Status: DC | PRN

## 2023-04-27 MED ORDER — MIDAZOLAM HCL 2 MG/2ML IJ SOLN
INTRAMUSCULAR | Status: DC | PRN
  Administered 2023-04-27: 2 mg via INTRAVENOUS

## 2023-04-27 MED ORDER — LIDOCAINE HCL (PF) 1 % IJ SOLN
INTRAMUSCULAR | Status: DC | PRN
  Administered 2023-04-27: 2 mL

## 2023-04-27 MED ORDER — HEPARIN (PORCINE) IN NACL 1000-0.9 UT/500ML-% IV SOLN
INTRAVENOUS | Status: DC | PRN
  Administered 2023-04-27 (×2): 500 mL

## 2023-04-27 MED ORDER — SODIUM CHLORIDE 0.9 % WEIGHT BASED INFUSION: 1.0000 mL/kg/h | INTRAVENOUS | Status: DC

## 2023-04-27 MED ORDER — HYDRALAZINE HCL 20 MG/ML IJ SOLN: 10.0000 mg | INTRAMUSCULAR | Status: DC | PRN

## 2023-04-27 MED ORDER — HEPARIN SODIUM (PORCINE) 1000 UNIT/ML IJ SOLN
INTRAMUSCULAR | Status: DC | PRN
  Administered 2023-04-27: 4500 [IU] via INTRAVENOUS

## 2023-04-27 MED ORDER — MIDAZOLAM HCL 2 MG/2ML IJ SOLN
INTRAMUSCULAR | Status: AC
  Filled 2023-04-27: qty 2

## 2023-04-27 MED ORDER — VERAPAMIL HCL 2.5 MG/ML IV SOLN
INTRAVENOUS | Status: AC
  Filled 2023-04-27: qty 2

## 2023-04-27 MED ORDER — LIDOCAINE HCL (PF) 1 % IJ SOLN
INTRAMUSCULAR | Status: AC
  Filled 2023-04-27: qty 30

## 2023-04-27 SURGICAL SUPPLY — 13 items
BAND CMPR LRG ZPHR (HEMOSTASIS) ×1
BAND ZEPHYR COMPRESS 30 LONG (HEMOSTASIS) IMPLANT
CATH 5FR JL3.5 JR4 ANG PIG MP (CATHETERS) IMPLANT
CATH INFINITI 5FR JL4 (CATHETERS) IMPLANT
GLIDESHEATH SLEND SS 6F .021 (SHEATH) IMPLANT
GUIDEWIRE INQWIRE 1.5J.035X260 (WIRE) IMPLANT
INQWIRE 1.5J .035X260CM (WIRE) ×1
KIT HEART LEFT (KITS) ×1 IMPLANT
PACK CARDIAC CATHETERIZATION (CUSTOM PROCEDURE TRAY) ×1 IMPLANT
SHEATH PROBE COVER 6X72 (BAG) IMPLANT
TRANSDUCER W/STOPCOCK (MISCELLANEOUS) ×1 IMPLANT
TUBING CIL FLEX 10 FLL-RA (TUBING) ×1 IMPLANT
WIRE HI TORQ VERSACORE-J 145CM (WIRE) IMPLANT

## 2023-04-27 NOTE — Interval H&P Note (Signed)
Cath Lab Visit (complete for each Cath Lab visit)  Clinical Evaluation Leading to the Procedure:   ACS: Yes.    Non-ACS:    Anginal Classification: CCS IV  Anti-ischemic medical therapy: Maximal Therapy (2 or more classes of medications)  Non-Invasive Test Results: No non-invasive testing performed  Prior CABG: No previous CABG    Accelerating angina.  Not a candidate for noninvasive testing.  Declined going to ER from office a few days ago since he has to care for his wife at home.    History and Physical Interval Note:  04/27/2023 8:45 AM  [name] [name]  has presented today for surgery, with the diagnosis of unstable angina - cad.  The various methods of treatment have been discussed with the patient and family. After consideration of risks, benefits and other options for treatment, the patient has consented to  Procedure(s): LEFT HEART CATH AND CORONARY ANGIOGRAPHY (N/A) as a surgical intervention.  The patient's history has been reviewed, patient examined, no change in status, stable for surgery.  I have reviewed the patient's chart and labs.  Questions were answered to the patient's satisfaction.     Lance Muss

## 2023-04-27 NOTE — Interval H&P Note (Signed)
History and Physical Interval Note:  04/27/2023 8:46 AM  [name] [name]  has presented today for surgery, with the diagnosis of unstable angina - cad.  The various methods of treatment have been discussed with the patient and family. After consideration of risks, benefits and other options for treatment, the patient has consented to  Procedure(s): LEFT HEART CATH AND CORONARY ANGIOGRAPHY (N/A) as a surgical intervention.  The patient's history has been reviewed, patient examined, no change in status, stable for surgery.  I have reviewed the patient's chart and labs.  Questions were answered to the patient's satisfaction.    I saw the patient in the office on Tuesday of this week with [name] [name] due to his accelerating angina.  Cath procedure questions were all answered.     Lance Muss

## 2023-04-27 NOTE — Telephone Encounter (Signed)
See phone note 04/26/23

## 2023-04-30 ENCOUNTER — Encounter (HOSPITAL_COMMUNITY): Payer: Self-pay | Admitting: Interventional Cardiology

## 2023-04-30 ENCOUNTER — Encounter: Payer: Self-pay | Admitting: *Deleted

## 2023-05-24 NOTE — Progress Notes (Deleted)
Office Visit    Patient Name: [name] [name] Date of Encounter: 05/24/2023  Primary Care Provider:  Daisy Floro, MD Primary Cardiologist:  Armanda Magic, MD Primary Electrophysiologist: None   Past Medical History    Past Medical History:  Diagnosis Date   Coronary artery disease    s/p PCI of LAD w Residual 50-60% RCA; s/p inferior STEMI 01/28/2021 with 99% dRCA beyond the PDA, 50% mid and distal PCA, 70% pRCA, 40% pLAD prior to LAD stent on cath  s/p failed PTCA>>on medical therapy   Diabetes (HCC)    Dilated aortic root (HCC)    40mm by echo 11/2019   Heart attack (HCC)    High cholesterol    History of echocardiogram 06/2011   Normal LVF,mild LVH,trivial TR/MR, mild AS/AI   History of kidney stones    Hypertension    Mild aortic stenosis    mild by echo 11/2019   Tremor    Past Surgical History:  Procedure Laterality Date   arm surgery Right    LEFT HEART CATH AND CORONARY ANGIOGRAPHY N/A 01/28/2021   Procedure: LEFT HEART CATH AND CORONARY ANGIOGRAPHY;  Surgeon: Lyn Records, MD;  Location: [facility] INVASIVE CV LAB;  Service: Cardiovascular;  Laterality: N/A;   LEFT HEART CATH AND CORONARY ANGIOGRAPHY N/A 04/27/2023   Procedure: LEFT HEART CATH AND CORONARY ANGIOGRAPHY;  Surgeon: Corky Crafts, MD;  Location: [facility] INVASIVE CV LAB;  Service: Cardiovascular;  Laterality: N/A;   stents - placement     x 2    Allergies  Allergies  Allergen Reactions   Codeine Other (See Comments)    Vertigo and upset stomach     History of Present Illness    [name] [name]  is a 72 year old male with a PMH of CAD 2011 after abnormal Lexiscan, s/p PCI to LAD with residual 60% RCA, inferior STEMI 01/2021 with 99% distal RCA lesion unable to cross and medically treated HLD, HTN, mild aortic stenosis, dilated ascending aorta who presents today for 1 month follow-up.  [name] [name] has been followed by Dr. Mayford Knife for over 15 years and underwent Cardiolite for chest pain in 2011 which was  abnormal.  He had a LHC performed that revealed 99% stenosis of the LAD that was treated with DES x 1.  He was seen on 02/04/2014 and reported doing well with no residual effects.  2D echo was completed in 2016 with EF of 60 to 65% and mild aortic stenosis.  He was seen 01/2021 in follow-up and reported sensation of indigestion with radiation to both arms.  He presented to the ED via EMS and EKG showed STEMI and patient underwent LHC that showed 99% distal RCA stenosis but was unable to cross wire to perform angioplasty.  He was seen on 04/24/2023 with complaint of continuing exertional chest pain with dizziness.  I discussed his case with the DOD and recommendation was made to undergo repeat LHC that showed collateral flow and improvement to mid RCA disease with recommendation to continue medical therapy.   Since last being seen in the office patient reports***.  Patient denies chest pain, palpitations, dyspnea, PND, orthopnea, nausea, vomiting, dizziness, syncope, edema, weight gain, or early satiety.    ***Notes: -LHC performed showing 40% ostial LAD to proximal LAD, 50% mid RCA stenosis percent distal RCA stenosis -Mid RCA showed improvement with left to right collaterals in medical therapy recommended Home Medications    Current Outpatient Medications  Medication Sig  Dispense Refill   allopurinol (ZYLOPRIM) 300 MG tablet Take 1 tablet (300 mg total) by mouth daily. 30 tablet 6   amLODipine (NORVASC) 10 MG tablet Take 10 mg by mouth daily.     aspirin 81 MG tablet Take 81 mg by mouth daily.     Blood Glucose Monitoring Suppl (FREESTYLE LITE) DEVI Please dispense continuous glucose monitoring system thank you 1 each 0   buPROPion (WELLBUTRIN XL) 300 MG 24 hr tablet Take 300 mg by mouth daily.     carvedilol (COREG) 6.25 MG tablet Take 1 tablet (6.25 mg total) by mouth 2 (two) times daily with a meal. 120 tablet 3   clopidogrel (PLAVIX) 75 MG tablet Take 1 tablet (75 mg total) by mouth daily with  breakfast. 90 tablet 3   fenofibrate (TRICOR) 145 MG tablet TAKE 1 TABLET BY MOUTH EVERY DAY 90 tablet 3   gabapentin (NEURONTIN) 300 MG capsule Take 300 mg by mouth at bedtime.     Insulin Glargine (LANTUS) 100 UNIT/ML Solostar Pen Inject 10 Units into the skin daily. (Patient taking differently: Inject 26 Units into the skin every morning.) 15 mL 1   isosorbide mononitrate (IMDUR) 30 MG 24 hr tablet Take 1 tablet (30 mg total) by mouth daily. Pt needs to call and make appt with provider for further refills - 1st attempt 30 tablet 0   losartan-hydrochlorothiazide (HYZAAR) 100-12.5 MG tablet Take 1 tablet by mouth daily.     nitroGLYCERIN (NITROSTAT) 0.4 MG SL tablet Place 1 tablet (0.4 mg total) under the tongue every 5 (five) minutes as needed for chest pain. 25 tablet 3   primidone (MYSOLINE) 50 MG tablet 2 tabs in the morning 1 tab at night 270 tablet 4   REPATHA SURECLICK 140 MG/ML SOAJ INJECT 140 MG INTO THE SKIN EVERY 14 (FOURTEEN) DAYS. 6 mL 3   rosuvastatin (CRESTOR) 40 MG tablet Take 1 tablet (40 mg total) by mouth daily. Pt needs to call and make appt with provider for further refills - 1st attempt 30 tablet 0   Current Facility-Administered Medications  Medication Dose Route Frequency Provider Last Rate Last Admin   sodium chloride flush (NS) 0.9 % injection 3 mL  3 mL Intravenous Q12H Gaston Islam., NP         Review of Systems  Please see the history of present illness.    (+)*** (+)***  All other systems reviewed and are otherwise negative except as noted above.  Physical Exam    Wt Readings from Last 3 Encounters:  04/27/23 202 lb (91.6 kg)  04/24/23 203 lb (92.1 kg)  12/08/22 235 lb (106.6 kg)   [id number] were no vitals filed for this visit.,There is no height or weight on file to calculate BMI.  Constitutional:      Appearance: Healthy appearance. Not in distress.  Neck:     Vascular: JVD normal.  Pulmonary:     Effort: Pulmonary effort is normal.      Breath sounds: No wheezing. No rales. Diminished in the bases Cardiovascular:     Normal rate. Regular rhythm. Normal S1. Normal S2.      Murmurs: There is no murmur.  Edema:    Peripheral edema absent.  Abdominal:     Palpations: Abdomen is soft non tender. There is no hepatomegaly.  Skin:    General: Skin is warm and dry.  Neurological:     General: No focal deficit present.     Mental Status: Alert and  oriented to person, place and time.     Cranial Nerves: Cranial nerves are intact.  EKG/LABS/ Recent Cardiac Studies    ECG personally reviewed by me today - ***   Risk Assessment/Calculations:   {Does this patient have ATRIAL FIBRILLATION?[phone]}        Lab Results  Component Value Date   WBC 9.3 04/24/2023   HGB 17.2 04/24/2023   HCT 48.8 04/24/2023   MCV 90 04/24/2023   PLT 197 04/24/2023   Lab Results  Component Value Date   CREATININE 1.45 (H) 04/24/2023   BUN 23 04/24/2023   NA 134 04/24/2023   K 4.5 04/24/2023   CL 94 (L) 04/24/2023   CO2 23 04/24/2023   Lab Results  Component Value Date   ALT 35 04/24/2023   AST 32 04/24/2023   ALKPHOS 94 04/24/2023   BILITOT 0.7 04/24/2023   Lab Results  Component Value Date   CHOL 224 (H) 01/28/2021   HDL 36 (L) 01/28/2021   LDLCALC 144 (H) 01/28/2021   LDLDIRECT 54.0 08/18/2015   TRIG 218 (H) 01/28/2021   CHOLHDL 6.2 01/28/2021    Lab Results  Component Value Date   HGBA1C 9.6 (H) 01/28/2021     Assessment & Plan    1.1.  Coronary artery disease: -s/p DES to LAD 2011 with STEMI in 01/2021 with cath showing 99% dRCA beyond the PDA, 50% mid and distal PCA, 70% pRCA, 40% pLAD prior to LAD stent on cath  s/p failed PTCA>>on medical therapy   2.  Chest pain  3.Essential hypertension: -Patient's blood pressure today was  4.Hyperlipidemia: -Patient's LDL cholesterol is 71 -Continue Crestor 40 mg daily  5.DM type II: -Patient's last hemoglobin A1c was 11.3 -Continue treatment plan per PCP       Disposition: Follow-up with Armanda Magic, MD or APP in *** months {Are you ordering a CV Procedure (e.g. stress test, cath, DCCV, TEE, etc)?   Press F2        :[phone]}   Medication Adjustments/Labs and Tests Ordered: Current medicines are reviewed at length with the patient today.  Concerns regarding medicines are outlined above.   Signed, Napoleon Form, Leodis Rains, NP 05/24/2023, 10:13 AM [facility]

## 2023-05-25 ENCOUNTER — Encounter: Payer: Self-pay | Admitting: Nurse Practitioner

## 2023-05-25 ENCOUNTER — Ambulatory Visit: Payer: Medicare HMO | Attending: Nurse Practitioner | Admitting: Nurse Practitioner

## 2023-05-25 DIAGNOSIS — I10 Essential (primary) hypertension: Secondary | ICD-10-CM

## 2023-05-25 DIAGNOSIS — I25110 Atherosclerotic heart disease of native coronary artery with unstable angina pectoris: Secondary | ICD-10-CM

## 2023-05-25 DIAGNOSIS — E1159 Type 2 diabetes mellitus with other circulatory complications: Secondary | ICD-10-CM

## 2023-05-25 DIAGNOSIS — R079 Chest pain, unspecified: Secondary | ICD-10-CM

## 2023-05-25 DIAGNOSIS — E1169 Type 2 diabetes mellitus with other specified complication: Secondary | ICD-10-CM

## 2023-09-04 DIAGNOSIS — E1165 Type 2 diabetes mellitus with hyperglycemia: Secondary | ICD-10-CM | POA: Diagnosis not present

## 2023-09-04 DIAGNOSIS — Z794 Long term (current) use of insulin: Secondary | ICD-10-CM | POA: Diagnosis not present

## 2023-09-20 DIAGNOSIS — E1169 Type 2 diabetes mellitus with other specified complication: Secondary | ICD-10-CM | POA: Diagnosis not present

## 2023-09-20 DIAGNOSIS — E669 Obesity, unspecified: Secondary | ICD-10-CM | POA: Diagnosis not present

## 2023-09-20 DIAGNOSIS — E782 Mixed hyperlipidemia: Secondary | ICD-10-CM | POA: Diagnosis not present

## 2023-09-20 DIAGNOSIS — E1165 Type 2 diabetes mellitus with hyperglycemia: Secondary | ICD-10-CM | POA: Diagnosis not present

## 2023-09-20 DIAGNOSIS — I10 Essential (primary) hypertension: Secondary | ICD-10-CM | POA: Diagnosis not present

## 2023-09-20 DIAGNOSIS — E7800 Pure hypercholesterolemia, unspecified: Secondary | ICD-10-CM | POA: Diagnosis not present

## 2023-09-20 DIAGNOSIS — M109 Gout, unspecified: Secondary | ICD-10-CM | POA: Diagnosis not present

## 2023-09-20 DIAGNOSIS — Z125 Encounter for screening for malignant neoplasm of prostate: Secondary | ICD-10-CM | POA: Diagnosis not present

## 2024-02-11 DIAGNOSIS — E782 Mixed hyperlipidemia: Secondary | ICD-10-CM | POA: Diagnosis not present

## 2024-02-11 DIAGNOSIS — Z0000 Encounter for general adult medical examination without abnormal findings: Secondary | ICD-10-CM | POA: Diagnosis not present

## 2024-02-11 DIAGNOSIS — I10 Essential (primary) hypertension: Secondary | ICD-10-CM | POA: Diagnosis not present

## 2024-02-11 DIAGNOSIS — F324 Major depressive disorder, single episode, in partial remission: Secondary | ICD-10-CM | POA: Diagnosis not present

## 2024-02-11 DIAGNOSIS — E1140 Type 2 diabetes mellitus with diabetic neuropathy, unspecified: Secondary | ICD-10-CM | POA: Diagnosis not present

## 2024-02-11 DIAGNOSIS — R3915 Urgency of urination: Secondary | ICD-10-CM | POA: Diagnosis not present

## 2024-02-11 DIAGNOSIS — Z125 Encounter for screening for malignant neoplasm of prostate: Secondary | ICD-10-CM | POA: Diagnosis not present

## 2024-02-11 DIAGNOSIS — R251 Tremor, unspecified: Secondary | ICD-10-CM | POA: Diagnosis not present

## 2024-02-11 DIAGNOSIS — M109 Gout, unspecified: Secondary | ICD-10-CM | POA: Diagnosis not present

## 2024-02-11 DIAGNOSIS — Z6833 Body mass index (BMI) 33.0-33.9, adult: Secondary | ICD-10-CM | POA: Diagnosis not present

## 2024-02-11 DIAGNOSIS — E1165 Type 2 diabetes mellitus with hyperglycemia: Secondary | ICD-10-CM | POA: Diagnosis not present

## 2024-02-11 DIAGNOSIS — E669 Obesity, unspecified: Secondary | ICD-10-CM | POA: Diagnosis not present

## 2024-02-12 DIAGNOSIS — H6121 Impacted cerumen, right ear: Secondary | ICD-10-CM | POA: Diagnosis not present

## 2024-05-22 DIAGNOSIS — J208 Acute bronchitis due to other specified organisms: Secondary | ICD-10-CM | POA: Diagnosis not present

## 2024-05-26 DIAGNOSIS — Z6832 Body mass index (BMI) 32.0-32.9, adult: Secondary | ICD-10-CM | POA: Diagnosis not present

## 2024-05-26 DIAGNOSIS — E1165 Type 2 diabetes mellitus with hyperglycemia: Secondary | ICD-10-CM | POA: Diagnosis not present

## 2024-05-26 DIAGNOSIS — I10 Essential (primary) hypertension: Secondary | ICD-10-CM | POA: Diagnosis not present

## 2024-05-26 DIAGNOSIS — E1169 Type 2 diabetes mellitus with other specified complication: Secondary | ICD-10-CM | POA: Diagnosis not present

## 2024-09-01 DIAGNOSIS — I10 Essential (primary) hypertension: Secondary | ICD-10-CM | POA: Diagnosis not present

## 2024-09-01 DIAGNOSIS — E1165 Type 2 diabetes mellitus with hyperglycemia: Secondary | ICD-10-CM | POA: Diagnosis not present

## 2024-09-01 DIAGNOSIS — R0789 Other chest pain: Secondary | ICD-10-CM | POA: Diagnosis not present

## 2024-09-01 DIAGNOSIS — Z6832 Body mass index (BMI) 32.0-32.9, adult: Secondary | ICD-10-CM | POA: Diagnosis not present
